# Patient Record
Sex: Male | Born: 1985 | Race: White | Hispanic: No | Marital: Single | Smoking: Never smoker
Health system: Southern US, Community
[De-identification: ages and names within clinical notes are randomized; demographics above are authoritative.]

## PROBLEM LIST (undated history)

## (undated) DIAGNOSIS — I10 Essential (primary) hypertension: Secondary | ICD-10-CM

## (undated) DIAGNOSIS — I4891 Unspecified atrial fibrillation: Secondary | ICD-10-CM

## (undated) DIAGNOSIS — R569 Unspecified convulsions: Secondary | ICD-10-CM

## (undated) HISTORY — PX: KIDNEY TRANSPLANT: SHX239

---
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
## 2004-07-17 ENCOUNTER — Ambulatory Visit: Payer: Self-pay | Admitting: Nephrology

## 2004-08-05 ENCOUNTER — Ambulatory Visit: Payer: Self-pay | Admitting: Nephrology

## 2004-08-12 ENCOUNTER — Ambulatory Visit: Payer: Self-pay | Admitting: Nephrology

## 2004-08-14 ENCOUNTER — Ambulatory Visit: Payer: Self-pay | Admitting: Nephrology

## 2004-08-16 ENCOUNTER — Ambulatory Visit: Payer: Self-pay | Admitting: Nephrology

## 2004-08-21 ENCOUNTER — Ambulatory Visit: Payer: Self-pay | Admitting: Nephrology

## 2004-08-28 ENCOUNTER — Ambulatory Visit: Payer: Self-pay | Admitting: Nephrology

## 2004-09-02 ENCOUNTER — Ambulatory Visit: Payer: Self-pay | Admitting: *Deleted

## 2004-09-09 ENCOUNTER — Ambulatory Visit: Payer: Self-pay | Admitting: *Deleted

## 2005-02-07 DIAGNOSIS — R109 Unspecified abdominal pain: Secondary | ICD-10-CM | POA: Diagnosis present

## 2005-11-05 ENCOUNTER — Other Ambulatory Visit: Payer: Self-pay

## 2005-11-05 ENCOUNTER — Emergency Department: Payer: Self-pay | Admitting: Emergency Medicine

## 2005-11-19 ENCOUNTER — Ambulatory Visit: Payer: Self-pay | Admitting: Nephrology

## 2006-01-12 ENCOUNTER — Ambulatory Visit: Payer: Self-pay | Admitting: Nephrology

## 2006-01-16 ENCOUNTER — Emergency Department: Payer: Self-pay | Admitting: Emergency Medicine

## 2006-01-19 ENCOUNTER — Ambulatory Visit: Payer: Self-pay | Admitting: Nephrology

## 2006-04-24 ENCOUNTER — Ambulatory Visit: Payer: Self-pay | Admitting: Nephrology

## 2007-01-21 ENCOUNTER — Emergency Department: Payer: Self-pay | Admitting: Emergency Medicine

## 2007-01-21 ENCOUNTER — Other Ambulatory Visit: Payer: Self-pay

## 2007-11-30 DIAGNOSIS — I4891 Unspecified atrial fibrillation: Secondary | ICD-10-CM | POA: Diagnosis present

## 2008-04-29 DIAGNOSIS — F32A Depression, unspecified: Secondary | ICD-10-CM | POA: Insufficient documentation

## 2008-04-29 DIAGNOSIS — F329 Major depressive disorder, single episode, unspecified: Secondary | ICD-10-CM | POA: Insufficient documentation

## 2008-11-07 ENCOUNTER — Emergency Department: Payer: Self-pay | Admitting: Unknown Physician Specialty

## 2009-01-08 DIAGNOSIS — T8612 Kidney transplant failure: Secondary | ICD-10-CM | POA: Insufficient documentation

## 2009-01-12 DIAGNOSIS — N433 Hydrocele, unspecified: Secondary | ICD-10-CM | POA: Insufficient documentation

## 2009-06-14 DIAGNOSIS — B338 Other specified viral diseases: Secondary | ICD-10-CM | POA: Insufficient documentation

## 2011-05-11 DIAGNOSIS — G479 Sleep disorder, unspecified: Secondary | ICD-10-CM | POA: Insufficient documentation

## 2011-09-02 DIAGNOSIS — I10 Essential (primary) hypertension: Secondary | ICD-10-CM | POA: Insufficient documentation

## 2012-02-23 DIAGNOSIS — Z940 Kidney transplant status: Secondary | ICD-10-CM | POA: Insufficient documentation

## 2012-03-10 DIAGNOSIS — M792 Neuralgia and neuritis, unspecified: Secondary | ICD-10-CM | POA: Insufficient documentation

## 2012-03-10 DIAGNOSIS — G8929 Other chronic pain: Secondary | ICD-10-CM | POA: Insufficient documentation

## 2012-03-10 DIAGNOSIS — N19 Unspecified kidney failure: Secondary | ICD-10-CM | POA: Insufficient documentation

## 2012-05-11 DIAGNOSIS — Z48298 Encounter for aftercare following other organ transplant: Secondary | ICD-10-CM | POA: Insufficient documentation

## 2013-05-02 ENCOUNTER — Emergency Department: Payer: Self-pay | Admitting: Emergency Medicine

## 2013-05-31 DIAGNOSIS — D631 Anemia in chronic kidney disease: Secondary | ICD-10-CM | POA: Diagnosis present

## 2013-06-09 DIAGNOSIS — G40909 Epilepsy, unspecified, not intractable, without status epilepticus: Secondary | ICD-10-CM | POA: Insufficient documentation

## 2013-07-11 ENCOUNTER — Inpatient Hospital Stay: Payer: Self-pay | Admitting: Internal Medicine

## 2013-07-11 LAB — URINALYSIS, COMPLETE
Bilirubin,UR: NEGATIVE
Glucose,UR: NEGATIVE mg/dL (ref 0–75)
Ketone: NEGATIVE
Leukocyte Esterase: NEGATIVE
Nitrite: NEGATIVE
Ph: 6 (ref 4.5–8.0)
Protein: NEGATIVE
WBC UR: NONE SEEN /HPF (ref 0–5)

## 2013-07-11 LAB — DRUG SCREEN, URINE
Amphetamines, Ur Screen: NEGATIVE (ref ?–1000)
Cannabinoid 50 Ng, Ur ~~LOC~~: POSITIVE (ref ?–50)
MDMA (Ecstasy)Ur Screen: NEGATIVE (ref ?–500)
Methadone, Ur Screen: NEGATIVE (ref ?–300)
Opiate, Ur Screen: NEGATIVE (ref ?–300)
Tricyclic, Ur Screen: NEGATIVE (ref ?–1000)

## 2013-07-11 LAB — COMPREHENSIVE METABOLIC PANEL
Albumin: 4.7 g/dL (ref 3.4–5.0)
BUN: 39 mg/dL — ABNORMAL HIGH (ref 7–18)
Calcium, Total: 8.9 mg/dL (ref 8.5–10.1)
Co2: 14 mmol/L — ABNORMAL LOW (ref 21–32)
EGFR (African American): 41 — ABNORMAL LOW
EGFR (Non-African Amer.): 35 — ABNORMAL LOW
Glucose: 106 mg/dL — ABNORMAL HIGH (ref 65–99)
Osmolality: 261 (ref 275–301)
Potassium: 3.4 mmol/L — ABNORMAL LOW (ref 3.5–5.1)
SGOT(AST): 45 U/L — ABNORMAL HIGH (ref 15–37)
Sodium: 125 mmol/L — ABNORMAL LOW (ref 136–145)

## 2013-07-11 LAB — CBC
HGB: 12 g/dL — ABNORMAL LOW (ref 13.0–18.0)
MCH: 31.1 pg (ref 26.0–34.0)
MCV: 88 fL (ref 80–100)
Platelet: 248 10*3/uL (ref 150–440)
RBC: 3.85 10*6/uL — ABNORMAL LOW (ref 4.40–5.90)
RDW: 13.8 % (ref 11.5–14.5)
WBC: 9.1 10*3/uL (ref 3.8–10.6)

## 2013-07-11 LAB — PROTIME-INR: Prothrombin Time: 12.9 secs (ref 11.5–14.7)

## 2013-07-11 LAB — ETHANOL: Ethanol: <3 mg/dL

## 2013-07-11 LAB — OSMOLALITY, URINE: Osmolality: 174 mOsm/kg

## 2013-07-12 LAB — CBC WITH DIFFERENTIAL/PLATELET
Eosinophil #: 0.1 10*3/uL (ref 0.0–0.7)
HCT: 32 % — ABNORMAL LOW (ref 40.0–52.0)
HGB: 11.3 g/dL — ABNORMAL LOW (ref 13.0–18.0)
Lymphocyte %: 15.9 %
MCH: 31.1 pg (ref 26.0–34.0)
MCV: 88 fL (ref 80–100)
Monocyte %: 11.4 %
Neutrophil #: 6 10*3/uL (ref 1.4–6.5)
Neutrophil %: 70.3 %
Platelet: 223 10*3/uL (ref 150–440)
RBC: 3.63 10*6/uL — ABNORMAL LOW (ref 4.40–5.90)
RDW: 13.8 % (ref 11.5–14.5)

## 2013-07-12 LAB — BASIC METABOLIC PANEL
Calcium, Total: 8.7 mg/dL (ref 8.5–10.1)
Chloride: 113 mmol/L — ABNORMAL HIGH (ref 98–107)
Co2: 17 mmol/L — ABNORMAL LOW (ref 21–32)
Creatinine: 1.95 mg/dL — ABNORMAL HIGH (ref 0.60–1.30)
EGFR (African American): 52 — ABNORMAL LOW
Glucose: 76 mg/dL (ref 65–99)
Potassium: 4 mmol/L (ref 3.5–5.1)

## 2013-07-12 LAB — CK
CK, Total: 1853 U/L — ABNORMAL HIGH (ref 35–232)
CK, Total: 2138 U/L — ABNORMAL HIGH (ref 35–232)

## 2013-07-13 LAB — BASIC METABOLIC PANEL
Anion Gap: 14 (ref 7–16)
BUN: 25 mg/dL — ABNORMAL HIGH (ref 7–18)
Calcium, Total: 9.2 mg/dL (ref 8.5–10.1)
Co2: 14 mmol/L — ABNORMAL LOW (ref 21–32)
EGFR (African American): 50 — ABNORMAL LOW
EGFR (Non-African Amer.): 43 — ABNORMAL LOW
Osmolality: 281 (ref 275–301)

## 2013-07-13 LAB — CK: CK, Total: 1513 U/L — ABNORMAL HIGH (ref 35–232)

## 2013-07-15 DIAGNOSIS — M79602 Pain in left arm: Secondary | ICD-10-CM | POA: Insufficient documentation

## 2013-07-16 LAB — CULTURE, BLOOD (SINGLE)

## 2013-10-04 ENCOUNTER — Emergency Department: Payer: Self-pay | Admitting: Emergency Medicine

## 2013-10-04 LAB — COMPREHENSIVE METABOLIC PANEL
ALK PHOS: 95 U/L
Albumin: 4.4 g/dL (ref 3.4–5.0)
Anion Gap: 9 (ref 7–16)
BUN: 34 mg/dL — ABNORMAL HIGH (ref 7–18)
Bilirubin,Total: 0.6 mg/dL (ref 0.2–1.0)
Calcium, Total: 8.5 mg/dL (ref 8.5–10.1)
Chloride: 99 mmol/L (ref 98–107)
Co2: 21 mmol/L (ref 21–32)
Creatinine: 1.88 mg/dL — ABNORMAL HIGH (ref 0.60–1.30)
EGFR (African American): 55 — ABNORMAL LOW
GFR CALC NON AF AMER: 47 — AB
Glucose: 81 mg/dL (ref 65–99)
Osmolality: 266 (ref 275–301)
POTASSIUM: 4.6 mmol/L (ref 3.5–5.1)
SGOT(AST): 32 U/L (ref 15–37)
SGPT (ALT): 28 U/L (ref 12–78)
Sodium: 129 mmol/L — ABNORMAL LOW (ref 136–145)
Total Protein: 7.8 g/dL (ref 6.4–8.2)

## 2013-10-04 LAB — CBC WITH DIFFERENTIAL/PLATELET
BASOS PCT: 1.1 %
Basophil #: 0.1 10*3/uL (ref 0.0–0.1)
EOS PCT: 2.6 %
Eosinophil #: 0.2 10*3/uL (ref 0.0–0.7)
HCT: 33.1 % — ABNORMAL LOW (ref 40.0–52.0)
HGB: 11.3 g/dL — ABNORMAL LOW (ref 13.0–18.0)
LYMPHS PCT: 12.3 %
Lymphocyte #: 1.1 10*3/uL (ref 1.0–3.6)
MCH: 30.1 pg (ref 26.0–34.0)
MCHC: 34.1 g/dL (ref 32.0–36.0)
MCV: 88 fL (ref 80–100)
Monocyte #: 0.7 x10 3/mm (ref 0.2–1.0)
Monocyte %: 7.7 %
Neutrophil #: 6.7 10*3/uL — ABNORMAL HIGH (ref 1.4–6.5)
Neutrophil %: 76.3 %
Platelet: 269 10*3/uL (ref 150–440)
RBC: 3.75 10*6/uL — ABNORMAL LOW (ref 4.40–5.90)
RDW: 12.7 % (ref 11.5–14.5)
WBC: 8.8 10*3/uL (ref 3.8–10.6)

## 2013-10-04 LAB — URINALYSIS, COMPLETE
BILIRUBIN, UR: NEGATIVE
BLOOD: NEGATIVE
Bacteria: NONE SEEN
Glucose,UR: NEGATIVE mg/dL (ref 0–75)
Ketone: NEGATIVE
LEUKOCYTE ESTERASE: NEGATIVE
NITRITE: NEGATIVE
PROTEIN: NEGATIVE
Ph: 6 (ref 4.5–8.0)
SPECIFIC GRAVITY: 1.003 (ref 1.003–1.030)
Squamous Epithelial: NONE SEEN
WBC UR: <1 /HPF (ref 0–5)

## 2013-10-04 LAB — LIPASE, BLOOD: Lipase: 430 U/L — ABNORMAL HIGH (ref 73–393)

## 2013-10-12 LAB — CBC WITH DIFFERENTIAL/PLATELET
BASOS PCT: 0.9 %
Basophil #: 0.1 10*3/uL (ref 0.0–0.1)
EOS PCT: 0.8 %
Eosinophil #: 0.1 10*3/uL (ref 0.0–0.7)
HCT: 33.1 % — AB (ref 40.0–52.0)
HGB: 11.7 g/dL — AB (ref 13.0–18.0)
Lymphocyte #: 1.1 10*3/uL (ref 1.0–3.6)
Lymphocyte %: 10.6 %
MCH: 30.8 pg (ref 26.0–34.0)
MCHC: 35.2 g/dL (ref 32.0–36.0)
MCV: 88 fL (ref 80–100)
MONO ABS: 0.5 x10 3/mm (ref 0.2–1.0)
Monocyte %: 5.1 %
NEUTROS ABS: 8.4 10*3/uL — AB (ref 1.4–6.5)
NEUTROS PCT: 82.6 %
Platelet: 255 10*3/uL (ref 150–440)
RBC: 3.79 10*6/uL — AB (ref 4.40–5.90)
RDW: 12.6 % (ref 11.5–14.5)
WBC: 10.1 10*3/uL (ref 3.8–10.6)

## 2013-10-12 LAB — URINALYSIS, COMPLETE
BACTERIA: NONE SEEN
BLOOD: NEGATIVE
Bilirubin,UR: NEGATIVE
GLUCOSE, UR: NEGATIVE mg/dL (ref 0–75)
Ketone: NEGATIVE
Leukocyte Esterase: NEGATIVE
Nitrite: NEGATIVE
Ph: 5 (ref 4.5–8.0)
Protein: NEGATIVE
Specific Gravity: 1.006 (ref 1.003–1.030)
Squamous Epithelial: NONE SEEN
WBC UR: NONE SEEN /HPF (ref 0–5)

## 2013-10-12 LAB — COMPREHENSIVE METABOLIC PANEL
ALBUMIN: 3.9 g/dL (ref 3.4–5.0)
Alkaline Phosphatase: 84 U/L
Anion Gap: 8 (ref 7–16)
BUN: 31 mg/dL — AB (ref 7–18)
Bilirubin,Total: 0.5 mg/dL (ref 0.2–1.0)
CALCIUM: 8 mg/dL — AB (ref 8.5–10.1)
CHLORIDE: 98 mmol/L (ref 98–107)
Co2: 19 mmol/L — ABNORMAL LOW (ref 21–32)
Creatinine: 1.87 mg/dL — ABNORMAL HIGH (ref 0.60–1.30)
GFR CALC AF AMER: 57 — AB
GFR CALC AF AMER: 55 — AB
GFR CALC NON AF AMER: 49 — AB
GFR CALC NON AF AMER: 48 — AB
Glucose: 92 mg/dL (ref 65–99)
Osmolality: 258 (ref 275–301)
Potassium: 4.3 mmol/L (ref 3.5–5.1)
SGOT(AST): 35 U/L (ref 15–37)
SGPT (ALT): 28 U/L (ref 12–78)
SODIUM: 125 mmol/L — AB (ref 136–145)
Total Protein: 7 g/dL (ref 6.4–8.2)

## 2013-10-12 LAB — LIPASE, BLOOD: Lipase: 527 U/L — ABNORMAL HIGH (ref 73–393)

## 2013-10-13 LAB — LIPASE, BLOOD: LIPASE: 1218 U/L — AB (ref 73–393)

## 2013-10-13 LAB — BASIC METABOLIC PANEL
Anion Gap: 5 — ABNORMAL LOW (ref 7–16)
BUN: 24 mg/dL — ABNORMAL HIGH (ref 7–18)
CALCIUM: 8.8 mg/dL (ref 8.5–10.1)
CHLORIDE: 109 mmol/L — AB (ref 98–107)
Co2: 22 mmol/L (ref 21–32)
Creatinine: 1.9 mg/dL — ABNORMAL HIGH (ref 0.60–1.30)
EGFR (African American): 54 — ABNORMAL LOW
EGFR (Non-African Amer.): 47 — ABNORMAL LOW
Glucose: 82 mg/dL (ref 65–99)
Osmolality: 275 (ref 275–301)
POTASSIUM: 4.4 mmol/L (ref 3.5–5.1)
SODIUM: 136 mmol/L (ref 136–145)

## 2013-10-14 ENCOUNTER — Inpatient Hospital Stay: Payer: Self-pay | Admitting: Student

## 2013-10-14 LAB — BASIC METABOLIC PANEL
ANION GAP: 9 (ref 7–16)
BUN: 22 mg/dL — ABNORMAL HIGH (ref 7–18)
CHLORIDE: 108 mmol/L — AB (ref 98–107)
CREATININE: 1.9 mg/dL — AB (ref 0.60–1.30)
Calcium, Total: 8.9 mg/dL (ref 8.5–10.1)
Co2: 19 mmol/L — ABNORMAL LOW (ref 21–32)
EGFR (Non-African Amer.): 47 — ABNORMAL LOW
GFR CALC AF AMER: 54 — AB
Glucose: 84 mg/dL (ref 65–99)
Osmolality: 274 (ref 275–301)
Potassium: 4.4 mmol/L (ref 3.5–5.1)
Sodium: 136 mmol/L (ref 136–145)

## 2013-10-14 LAB — LIPASE, BLOOD: LIPASE: 258 U/L (ref 73–393)

## 2013-12-04 DIAGNOSIS — R197 Diarrhea, unspecified: Secondary | ICD-10-CM | POA: Insufficient documentation

## 2014-02-06 ENCOUNTER — Emergency Department: Payer: Self-pay | Admitting: Emergency Medicine

## 2014-02-06 LAB — CBC WITH DIFFERENTIAL/PLATELET
BASOS PCT: 0.8 %
Basophil #: 0.1 10*3/uL (ref 0.0–0.1)
EOS PCT: 0.9 %
Eosinophil #: 0.1 10*3/uL (ref 0.0–0.7)
HCT: 37.3 % — ABNORMAL LOW (ref 40.0–52.0)
HGB: 12.6 g/dL — AB (ref 13.0–18.0)
Lymphocyte #: 1.7 10*3/uL (ref 1.0–3.6)
Lymphocyte %: 16.3 %
MCH: 30.5 pg (ref 26.0–34.0)
MCHC: 33.9 g/dL (ref 32.0–36.0)
MCV: 90 fL (ref 80–100)
Monocyte #: 0.9 x10 3/mm (ref 0.2–1.0)
Monocyte %: 8.8 %
Neutrophil #: 7.5 10*3/uL — ABNORMAL HIGH (ref 1.4–6.5)
Neutrophil %: 73.2 %
Platelet: 252 10*3/uL (ref 150–440)
RBC: 4.14 10*6/uL — ABNORMAL LOW (ref 4.40–5.90)
RDW: 12.8 % (ref 11.5–14.5)
WBC: 10.3 10*3/uL (ref 3.8–10.6)

## 2014-02-06 LAB — COMPREHENSIVE METABOLIC PANEL
ALK PHOS: 68 U/L
ANION GAP: 7 (ref 7–16)
Albumin: 4.7 g/dL (ref 3.4–5.0)
BUN: 26 mg/dL — AB (ref 7–18)
Bilirubin,Total: 0.7 mg/dL (ref 0.2–1.0)
CALCIUM: 9.3 mg/dL (ref 8.5–10.1)
Chloride: 95 mmol/L — ABNORMAL LOW (ref 98–107)
Co2: 21 mmol/L (ref 21–32)
Creatinine: 2.03 mg/dL — ABNORMAL HIGH (ref 0.60–1.30)
GFR CALC AF AMER: 50 — AB
GFR CALC NON AF AMER: 43 — AB
GLUCOSE: 97 mg/dL (ref 65–99)
Osmolality: 252 (ref 275–301)
POTASSIUM: 4.8 mmol/L (ref 3.5–5.1)
SGOT(AST): 40 U/L — ABNORMAL HIGH (ref 15–37)
SGPT (ALT): 28 U/L (ref 12–78)
Sodium: 123 mmol/L — ABNORMAL LOW (ref 136–145)
TOTAL PROTEIN: 7.9 g/dL (ref 6.4–8.2)

## 2014-02-06 LAB — URINALYSIS, COMPLETE
Bacteria: NONE SEEN
Bilirubin,UR: NEGATIVE
Blood: NEGATIVE
GLUCOSE, UR: NEGATIVE mg/dL (ref 0–75)
KETONE: NEGATIVE
Leukocyte Esterase: NEGATIVE
Nitrite: NEGATIVE
Ph: 6 (ref 4.5–8.0)
Protein: NEGATIVE
RBC, UR: NONE SEEN /HPF (ref 0–5)
SQUAMOUS EPITHELIAL: NONE SEEN
Specific Gravity: 1.005 (ref 1.003–1.030)
WBC UR: NONE SEEN /HPF (ref 0–5)

## 2014-02-06 LAB — PRO B NATRIURETIC PEPTIDE: B-Type Natriuretic Peptide: 1175 pg/mL — ABNORMAL HIGH (ref 0–125)

## 2014-02-08 DIAGNOSIS — N5082 Scrotal pain: Secondary | ICD-10-CM | POA: Insufficient documentation

## 2014-02-13 ENCOUNTER — Emergency Department: Payer: Self-pay | Admitting: Emergency Medicine

## 2014-02-28 DIAGNOSIS — R112 Nausea with vomiting, unspecified: Secondary | ICD-10-CM | POA: Insufficient documentation

## 2014-04-23 DIAGNOSIS — R413 Other amnesia: Secondary | ICD-10-CM | POA: Insufficient documentation

## 2014-05-12 ENCOUNTER — Emergency Department: Payer: Self-pay | Admitting: Emergency Medicine

## 2014-05-12 LAB — COMPREHENSIVE METABOLIC PANEL
ALBUMIN: 4.4 g/dL (ref 3.4–5.0)
Alkaline Phosphatase: 59 U/L
Anion Gap: 10 (ref 7–16)
BUN: 29 mg/dL — ABNORMAL HIGH (ref 7–18)
Bilirubin,Total: 0.8 mg/dL (ref 0.2–1.0)
CHLORIDE: 89 mmol/L — AB (ref 98–107)
CO2: 23 mmol/L (ref 21–32)
Calcium, Total: 9.2 mg/dL (ref 8.5–10.1)
Creatinine: 1.87 mg/dL — ABNORMAL HIGH (ref 0.60–1.30)
EGFR (African American): 55 — ABNORMAL LOW
GFR CALC NON AF AMER: 47 — AB
GLUCOSE: 101 mg/dL — AB (ref 65–99)
OSMOLALITY: 252 (ref 275–301)
POTASSIUM: 3.7 mmol/L (ref 3.5–5.1)
SGOT(AST): 44 U/L — ABNORMAL HIGH (ref 15–37)
SGPT (ALT): 26 U/L
SODIUM: 122 mmol/L — AB (ref 136–145)
Total Protein: 7.8 g/dL (ref 6.4–8.2)

## 2014-05-12 LAB — CBC WITH DIFFERENTIAL/PLATELET
BASOS ABS: 0.1 10*3/uL (ref 0.0–0.1)
BASOS PCT: 1.3 %
EOS ABS: 0.3 10*3/uL (ref 0.0–0.7)
Eosinophil %: 2.6 %
HCT: 35.8 % — ABNORMAL LOW (ref 40.0–52.0)
HGB: 12.3 g/dL — AB (ref 13.0–18.0)
LYMPHS ABS: 3.4 10*3/uL (ref 1.0–3.6)
LYMPHS PCT: 34.6 %
MCH: 31 pg (ref 26.0–34.0)
MCHC: 34.5 g/dL (ref 32.0–36.0)
MCV: 90 fL (ref 80–100)
Monocyte #: 1.4 x10 3/mm — ABNORMAL HIGH (ref 0.2–1.0)
Monocyte %: 13.8 %
Neutrophil #: 4.7 10*3/uL (ref 1.4–6.5)
Neutrophil %: 47.7 %
Platelet: 303 10*3/uL (ref 150–440)
RBC: 3.98 10*6/uL — AB (ref 4.40–5.90)
RDW: 12.7 % (ref 11.5–14.5)
WBC: 10 10*3/uL (ref 3.8–10.6)

## 2014-05-12 LAB — LIPASE, BLOOD: LIPASE: 367 U/L (ref 73–393)

## 2014-05-22 DIAGNOSIS — G8929 Other chronic pain: Secondary | ICD-10-CM | POA: Insufficient documentation

## 2014-05-22 DIAGNOSIS — R109 Unspecified abdominal pain: Secondary | ICD-10-CM | POA: Insufficient documentation

## 2014-05-25 ENCOUNTER — Emergency Department: Payer: Self-pay | Admitting: Emergency Medicine

## 2014-05-25 LAB — URINALYSIS, COMPLETE
BACTERIA: NONE SEEN
Bilirubin,UR: NEGATIVE
Blood: NEGATIVE
GLUCOSE, UR: NEGATIVE mg/dL (ref 0–75)
Ketone: NEGATIVE
Leukocyte Esterase: NEGATIVE
Nitrite: NEGATIVE
PROTEIN: NEGATIVE
Ph: 6 (ref 4.5–8.0)
RBC,UR: <1 /HPF (ref 0–5)
SPECIFIC GRAVITY: 1.005 (ref 1.003–1.030)
SQUAMOUS EPITHELIAL: NONE SEEN
WBC UR: NONE SEEN /HPF (ref 0–5)

## 2014-05-25 LAB — COMPREHENSIVE METABOLIC PANEL
Albumin: 4.3 g/dL (ref 3.4–5.0)
Alkaline Phosphatase: 61 U/L
Anion Gap: 10 (ref 7–16)
BUN: 24 mg/dL — AB (ref 7–18)
Bilirubin,Total: 0.8 mg/dL (ref 0.2–1.0)
CALCIUM: 9.2 mg/dL (ref 8.5–10.1)
CHLORIDE: 94 mmol/L — AB (ref 98–107)
CREATININE: 2.01 mg/dL — AB (ref 0.60–1.30)
Co2: 20 mmol/L — ABNORMAL LOW (ref 21–32)
EGFR (African American): 50 — ABNORMAL LOW
EGFR (Non-African Amer.): 43 — ABNORMAL LOW
Glucose: 86 mg/dL (ref 65–99)
OSMOLALITY: 253 (ref 275–301)
POTASSIUM: 5.3 mmol/L — AB (ref 3.5–5.1)
SGOT(AST): 51 U/L — ABNORMAL HIGH (ref 15–37)
SGPT (ALT): 26 U/L
SODIUM: 124 mmol/L — AB (ref 136–145)
Total Protein: 7.5 g/dL (ref 6.4–8.2)

## 2014-05-25 LAB — CBC WITH DIFFERENTIAL/PLATELET
BASOS PCT: 0.7 %
Basophil #: 0.1 10*3/uL (ref 0.0–0.1)
EOS ABS: 0.1 10*3/uL (ref 0.0–0.7)
EOS PCT: 1 %
HCT: 34.6 % — AB (ref 40.0–52.0)
HGB: 11.7 g/dL — AB (ref 13.0–18.0)
Lymphocyte #: 1.2 10*3/uL (ref 1.0–3.6)
Lymphocyte %: 10.4 %
MCH: 30.4 pg (ref 26.0–34.0)
MCHC: 33.8 g/dL (ref 32.0–36.0)
MCV: 90 fL (ref 80–100)
MONOS PCT: 6.3 %
Monocyte #: 0.7 x10 3/mm (ref 0.2–1.0)
Neutrophil #: 9.4 10*3/uL — ABNORMAL HIGH (ref 1.4–6.5)
Neutrophil %: 81.6 %
PLATELETS: 282 10*3/uL (ref 150–440)
RBC: 3.84 10*6/uL — ABNORMAL LOW (ref 4.40–5.90)
RDW: 12.6 % (ref 11.5–14.5)
WBC: 11.6 10*3/uL — AB (ref 3.8–10.6)

## 2014-05-25 LAB — LIPASE, BLOOD: Lipase: 412 U/L — ABNORMAL HIGH (ref 73–393)

## 2014-07-19 DIAGNOSIS — N529 Male erectile dysfunction, unspecified: Secondary | ICD-10-CM | POA: Insufficient documentation

## 2014-07-19 DIAGNOSIS — K859 Acute pancreatitis without necrosis or infection, unspecified: Secondary | ICD-10-CM | POA: Insufficient documentation

## 2014-07-19 DIAGNOSIS — E213 Hyperparathyroidism, unspecified: Secondary | ICD-10-CM | POA: Insufficient documentation

## 2014-07-19 DIAGNOSIS — Z87438 Personal history of other diseases of male genital organs: Secondary | ICD-10-CM | POA: Insufficient documentation

## 2014-07-19 DIAGNOSIS — IMO0002 Reserved for concepts with insufficient information to code with codable children: Secondary | ICD-10-CM | POA: Insufficient documentation

## 2014-07-19 DIAGNOSIS — K529 Noninfective gastroenteritis and colitis, unspecified: Secondary | ICD-10-CM | POA: Insufficient documentation

## 2014-07-19 DIAGNOSIS — E559 Vitamin D deficiency, unspecified: Secondary | ICD-10-CM | POA: Insufficient documentation

## 2014-07-19 DIAGNOSIS — M8580 Other specified disorders of bone density and structure, unspecified site: Secondary | ICD-10-CM | POA: Insufficient documentation

## 2014-07-19 DIAGNOSIS — M859 Disorder of bone density and structure, unspecified: Secondary | ICD-10-CM | POA: Insufficient documentation

## 2014-09-03 DIAGNOSIS — R1013 Epigastric pain: Secondary | ICD-10-CM | POA: Insufficient documentation

## 2015-01-04 NOTE — Discharge Summary (Signed)
PATIENT NAME:  John Cline, John Cline MR#:  [id number] DATE OF BIRTH:  05-08-1986  DATE OF ADMISSION:  07/11/2013 DATE OF DISCHARGE:  07/13/2013  DISCHARGE DIAGNOSES: 1.  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  2.  History of cerebrovascular accident.  3.  Chronic kidney disease, stage III, stable.  4.  Rhabdomyolysis.  5.  History of renal transplant.  6.  Hyponatremia, resolved.   IMAGING STUDIES DONE: Include:  1.  CT scan of head without contrast was normal.  2.  CT of cervical spine showed minimal anterolisthesis of C3, C4. No acute abnormalities, dislocation or fracture.  3.  MRI of the brain without contrast showed small area of prior stroke in the left parietal parafalcine area.   ADMITTING HISTORY AND PHYSICAL: Please see detailed H and P dictated by Dr. Posey Pronto. In brief, a 29 year old Caucasian male patient with prior history of stroke, renal transplant, presented to the hospital with 3 episodes of seizures. The patient was on Vimpat and Keppra, was loaded with Dilantin in the ER, but later started on IV Keppra, admitted to the hospitalist service.   HOSPITAL COURSE: 1.  Seizures. The patient had an EEG done which showed left frontal epileptiform waves after which neurology was consulted. Dr. Tamala Julian with neurology saw the patient and with concern for PRES ordered a MRI. The patient's Vimpat has been doubled in dose along with continuing the Keppra. The patient's MRI has returned showing old stroke, no acute findings. I have discussed this with Dr. Manuella Ghazi of neurology who suggested the patient will need outpatient follow-up with his neurologist and continuing his increased dose of Vimpat and Keppra.  2.  Hyponatremia. This is secondary to dehydration and decreased solutes from decreased eating. Resolved with IV fluids.  3.  Rhabdomyolysis secondary to seizures. Initially levels increased from 1800 to 2500 but they are down to 1600 and the patient is being  discharged home in fair condition.   Prior to discharge, the patient's neurological examination shows motor strength 5/5, alert and oriented x 3. Mood and affect appropriate.   DISCHARGE MEDICATIONS: Include:  1.  Vimpat 200 mg oral b.i.d.  2.  Amlodipine 5 mg 2 times a day. 3.  Cyclosporin 100 mg oral every 12 hours and 25 mg every 12 hours.  4.  Prednisone 5 mg oral once a day.  5.  Gabapentin 100 mg b.i.d.  6.  Keppra 1000 mg b.i.d.  7.  Metoprolol 75 mg oral 2 times a day.  8.  Sodium bicarbonate 650 mg oral once a day.   DISCHARGE INSTRUCTIONS: Low-sodium diet. Activity as tolerated. Follow up with nephrology and neurology at [facility] in 1 to 2 weeks.  TOTAL TIME SPENT: On day of discharge in discharge activity was 40 minutes. ____________________________ John Alf Stevan Eberwein, MD srs:sb D: 07/13/2013 13:50:50 ET T: 07/13/2013 14:34:11 ET JOB#: [id number]  cc: Alveta Heimlich R. Auna Mikkelsen, MD, <Dictator> [facility] MD ELECTRONICALLY SIGNED 07/14/2013 13:01

## 2015-01-04 NOTE — Consult Note (Signed)
Consulting Physician: Dr Arvella Merles for Consult: Renal txp, elevated CK levels, low serum sodium Mr Muraca is a 29 year old man s/o LRT 01/08/09. He follows with Dr Toy Cookey at [facility]. He was last seen on 06/04/13. Baseline Cr 2-2.5. He is on CSA 125 mg bid with a goal CSA trough of 75-125. He had labs on 10/21 and his Cr was 1.85 and CSA level of 75.  is admitted to [facility] apparently after having a seziure. His serum sodium was noted to be low. The patient admits that he has been struggling with edema and poor oral intake (poor solute intake) but trying to drink fluids. His Sodium on 10/21 was 133. He believes that he furst developed a headache and that led to his decreased oral intake. He denies any F/C/N/V/D.  had a CT Scan no contrast and no acute abnl. He did hit his head yesterday. He currently feels well but does have a mild left temporal headache. Neurology has been consulted. He is currently getting 1/2 NS. He is eating well. Of note, UTOX + for benzos and cannaboids. He admits to being compliant with his seizure medications and has not had issues with seizures "in years" MPGNBil native nephrectomyh/o HD and PDLRT x 2 (2000) (2010)htnh/o seziure d/ohernia repairdialysis access surgeries morphineCSA 125 mg bid, pred 5 mg qd, metoprolol 75 mg bid, norvasc 5 mg bid, sensipar 30 mg qd, keppra 1gm bid, lacosamide 100 mg bid, gapapentin 100/200 qam/qpm, amitrip 25 mg qpm reviewed  98.7  72 20  122/76 98 RAappears well, walking with IV poleclear bilRRRno tenderness over allograft in LLQno edemaalert, answering questions  29 year old man with LRT, baseline Cr 2-2.5 (most recent 1.9). LRT- Renal function stable. Continue outpt regimen of CSA 125 mg bid and prednisone 5 mg qd. Hyponatremia- resolved. Likely precipitated seizure. ?low solute intake? Getting 1/2 NS  HTN- pt also takes metoprolol as outpt. Currently on hold. Would restart in leiu of norvasc to avoid rebound hypertension or chest pain Mild rhabdo- CK  level midly elevated. Would change IVF to NS at 75 cc/hr and continue for another hour. He appears to be voiding.CK level and BMP in morning. am available by page at [phone] for any questions. I will not see the patient on 10/30. Please page with any questions. He can follow-up with Dr Toy Cookey, his txp nephrologist, as an outpt.  Electronic Signatures: Trinda Pascal (MD)  (Signed on 29-Oct-14 15:02)  Authored  Last Updated: 29-Oct-14 15:02 by Trinda Pascal (MD)

## 2015-01-04 NOTE — Consult Note (Signed)
Referring Physician:  Alric Seton   Primary Care Physician:  Alric Seton : [facility], [address] [place], [place] [address], [place] [phone]  Reason for Consult: Admit Date: 11-Jul-2013  Chief Complaint: seizures  Reason for Consult: seizure   History of Present Illness: History of Present Illness:   29 yo RHD M presents to [facility] secondary to seizure.  Pt has had seizures for the past 15 years.  He just had Vimpat added over the past 2 years.  He reports compliance with medications.  Last large seizure that he notes was in Mar 2013.  However, his mom is at bedside and notes that he has been having different episodes where he stares off and does not answer.  Pt does not remember these episodes but mom states he is confused after these.  No incontinence or tongue biting.  ROS:  General denies complaints   HEENT no complaints   Lungs no complaints   Cardiac no complaints   GI no complaints   GU no complaints   Musculoskeletal no complaints   Extremities no complaints   Skin no complaints   Neuro headache  seizure   Endocrine no complaints   Psych no complaints   Past Medical/Surgical Hx:  pancreatitis:   Seizures:   Nephrotic Syndrome:   Peritoneal Dialysis:   HTN:   Kidney Failure:   orignal and transplant kidneys removed at this time:   kidney transplant:   Past Medical/ Surgical Hx:  Past Medical History as above   Past Surgical History as above   Home Medications: Medication Instructions Last Modified Date/Time  Vimpat 100 mg oral tablet 2 tab(s) orally 2 times a day 29-Oct-14 12:03  amLODIPine 5 mg oral tablet 1 tab(s) orally 2 times a day 28-Oct-14 21:14  cycloSPORINE modified 100 mg oral capsule 1 cap(s) orally every 12 hours 28-Oct-14 21:14  cycloSPORINE modified 25 mg oral capsule 1 cap(s) orally every 12 hours 28-Oct-14 21:14  predniSONE 5 mg oral tablet 1 tab(s) orally once a day 28-Oct-14 21:14   gabapentin 100 mg oral capsule 1 cap(s) orally once a day 28-Oct-14 21:14  gabapentin 100 mg oral capsule 1 cap(s) orally once a day (at bedtime) 28-Oct-14 21:14  Keppra 1000 mg oral tablet 1 tab(s) orally 2 times a day 28-Oct-14 21:14  metoprolol 75 milligram(s) orally 2 times a day 28-Oct-14 21:14   Allergies:  No Known Allergies:   Allergies:  Allergies NKDA   Social/Family History: Employment Status: disabled  Lives With: parents  Living Arrangements: house  Social History: 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, no illicits, denies tob  Family History: no seizures   Vital Signs: **Vital Signs.:   29-Oct-14 08:00  Vital Signs Type Q 4hr  Temperature Temperature (F) 98.7  Celsius 37  Temperature Source oral  Pulse Pulse 72  Respirations Respirations 20  Systolic BP Systolic BP 144  Diastolic BP (mmHg) Diastolic BP (mmHg) 76  Mean BP 91  Pulse Ox % Pulse Ox % 98  Pulse Ox Activity Level  At rest  Oxygen Delivery Room Air/ 21 %   Physical Exam: General: thin, appears older than stated age, anxious  HEENT: normocephalic, sclera nonicteric, oropharynx clear  Neck: supple, no JVD, no bruits  Chest: CTA B, no wheezing, good movement  Cardiac: RRR, no murmurs, no edema, 2+ pulses  Extremities: no C/C/E, FROM   Neurologic Exam: Mental Status: alert and oriented x 3, normal speech and language,  follows complex commands  Cranial Nerves: PERRLA, EOMI, nl VF, face symmetric, tongue midline, shoulder shrug equal  Motor Exam: 5/5 B normal tone, high frequency tremor B UE  Deep Tendon Reflexes: 2+/4 B, plantars downgoing B, no Hoffman  Sensory Exam: pinprick, temperature, and vibration intact B  Coordination: FTN and HTS WNL, nl RAM   Lab Results: Thyroid:  29-Oct-14 05:03   Thyroid Stimulating Hormone 2.35 (0.45-4.50 (International Unit)  ----------------------- Pregnant patients have  different reference  ranges for TSH:  - - - - - - - - - -  Pregnant, first  trimetser:  0.36 - 2.50 uIU/mL)  Hepatic:  28-Oct-14 16:32   Bilirubin, Total 0.8  Alkaline Phosphatase 85  SGPT (ALT) 34  SGOT (AST)  45  Total Protein, Serum 7.7  Albumin, Serum 4.7  Routine Micro:  28-Oct-14 19:33   Micro Text Report BLOOD CULTURE   COMMENT                   NO GROWTH IN 8-12 HOURS   ANTIBIOTIC                       Culture Comment NO GROWTH IN 8-12 HOURS  Result(s) reported on 12 Jul 2013 at 03:00AM.  Routine Chem:  28-Oct-14 16:32   Ethanol, S. < 3  Ethanol % (comp) < 0.003 (Result(s) reported on 11 Jul 2013 at 07:22PM.)  Osmolality, Blood 276 (Result(s) reported on 11 Jul 2013 at 07:42PM.)  29-Oct-14 05:03   Glucose, Serum 76  BUN  32  Creatinine (comp)  1.95  Sodium, Serum 139  Potassium, Serum 4.0  Chloride, Serum  113  CO2, Serum  17  Calcium (Total), Serum 8.7  Anion Gap 9  Osmolality (calc) 283  eGFR (African American)  52  eGFR (Non-African American)  45 (eGFR values <52m/min/1.73 m2 may be an indication of chronic kidney disease (CKD). Calculated eGFR is useful in patients with stable renal function. The eGFR calculation will not be reliable in acutely ill patients when serum creatinine is changing rapidly. It is not useful in  patients on dialysis. The eGFR calculation may not be applicable to patients at the low and high extremes of body sizes, pregnant women, and vegetarians.)  Misc Urine Chem:  28-Oct-14 19:00   Osmolality, Urine Random 174 (50-1400 300-900 mOsm/kg   with avg Fluid Intake > 850 mOsm/kg  with Fluid Restriction)  Urine Drugs:  [id number]-57126:[id number]  Tricyclic Antidepressant, Ur Qual (comp) NEGATIVE (Result(s) reported on 11 Jul 2013 at 07:58PM.)  Amphetamines, Urine Qual. NEGATIVE  MDMA, Urine Qual. NEGATIVE  Cocaine Metabolite, Urine Qual. NEGATIVE  Opiate, Urine qual NEGATIVE  Phencyclidine, Urine Qual. NEGATIVE  Cannabinoid, Urine Qual. POSITIVE  Barbiturates, Urine Qual. NEGATIVE  Benzodiazepine, Urine Qual.  POSITIVE (----------------- The URINE DRUG SCREEN provides only a preliminary, unconfirmed analytical test result and should not be used for non-medical  purposes.  Clinical consideration and professional judgment should be  applied to any positive drug screen result due to possible interfering substances.  A more specific alternate chemical method must be used in order to obtain a confirmed analytical result.  Gas chromatography/mass spectrometry (GC/MS) is the preferred confirmatory method.)  Methadone, Urine Qual. NEGATIVE  Cardiac:  28-Oct-14 16:32   Troponin I < 0.02 (0.00-0.05 0.05 ng/mL or less: NEGATIVE  Repeat testing in 3-6 hrs  if clinically indicated. >0.05 ng/mL: POTENTIAL  MYOCARDIAL INJURY. Repeat  testing in 3-6 hrs if  clinically indicated.  NOTE: An increase or decrease  of 30% or more on serial  testing suggests a  clinically important change)  29-Oct-14 05:03   CK, Total  1853 (Result(s) reported on 12 Jul 2013 at 08:29AM.)  Routine UA:  28-Oct-14 19:00   Color (UA) Colorless  Clarity (UA) Clear  Glucose (UA) Negative  Bilirubin (UA) Negative  Ketones (UA) Negative  Specific Gravity (UA) 1.003  Blood (UA) 1+  pH (UA) 6.0  Protein (UA) Negative  Nitrite (UA) Negative  Leukocyte Esterase (UA) Negative (Result(s) reported on 11 Jul 2013 at 07:18PM.)  RBC (UA) 1 /HPF  WBC (UA) NONE SEEN  Bacteria (UA) TRACE  Epithelial Cells (UA) <1 /HPF (Result(s) reported on 11 Jul 2013 at 07:18PM.)  Routine Coag:  28-Oct-14 16:32   Prothrombin 12.9  INR 1.0 (INR reference interval applies to patients on anticoagulant therapy. A single INR therapeutic range for coumarins is not optimal for all indications; however, the suggested range for most indications is 2.0 - 3.0. Exceptions to the INR Reference Range may include: Prosthetic heart valves, acute myocardial infarction, prevention of myocardial infarction, and combinations of aspirin and anticoagulant. The  need for a higher or lower target INR must be assessed individually. Reference: The Pharmacology and Management of the Vitamin K  antagonists: the seventh ACCP Conference on Antithrombotic and Thrombolytic Therapy. OZDGU.4403 Sept:126 (3suppl): [id number]. A HCT value >55% may artifactually increase the PT.  In one study,  the increase was an average of 25%. Reference:  "Effect on Routine and Special Coagulation Testing Values of Citrate Anticoagulant Adjustment in Patients with High HCT Values." American Journal of Clinical Pathology 2006;126:400-405.)  Routine Hem:  29-Oct-14 05:03   WBC (CBC) 8.5  RBC (CBC)  3.63  Hemoglobin (CBC)  11.3  Hematocrit (CBC)  32.0  Platelet Count (CBC) 223  MCV 88  MCH 31.1  MCHC 35.3  RDW 13.8  Neutrophil % 70.3  Lymphocyte % 15.9  Monocyte % 11.4  Eosinophil % 1.3  Basophil % 1.1  Neutrophil # 6.0  Lymphocyte # 1.4  Monocyte # 1.0  Eosinophil # 0.1  Basophil # 0.1 (Result(s) reported on 12 Jul 2013 at [facility].)   Radiology Results: CT:    28-Oct-14 17:48, CT Head Without Contrast  CT Head Without Contrast   REASON FOR EXAM:    seizure fall head in njury  COMMENTS:       PROCEDURE: CT  - CT HEAD WITHOUT CONTRAST  - Jul 11 2013  5:48PM     RESULT: History: Seizure. Fall.    Comparison Study: Prior CT of 2007.     Findings: No mass. No hydrocephalus. No hemorrhage. Small right frontal   scalp hematoma. No underlying bony abnormality. Dural calcification again   noted.    IMPRESSION:  No acute intercranial abnormality.    Verified By: Osa Craver, M.D., MD   Radiology Impression: Radiology Impression: CT of head personally reviewed by me and normal   Impression/Recommendations: Recommendations:   previous notes reviewed by me reviewed by me   Epilepsy-  given the increased episodes of starring per mother and headaches, pt is probably having uncontrolled seizures.  EEG was abnormal as well. 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  Concerned for possible PRES as cause of headaches and uncontrolled seizures.  This would be secondary to CellCept if present. MRI of brain w/o contrast continue Keppra 1gm BID increase Vimpat to 22m BID if  MRI does not show PRES, pt can be discharged home;  if it does, he will need to go to a transplant center where they can adjust CellCept no driving or operating heavy machinery x 6 months will sign off, please have pt f/u with Dr. Melrose Nakayama in [facility] in 4-6 weeks  Electronic Signatures: Jamison Neighbor (MD)  (Signed 29-Oct-14 12:39)  Authored: REFERRING PHYSICIAN, Primary Care Physician, Consult, History of Present Illness, Review of Systems, PAST MEDICAL/SURGICAL HISTORY, HOME MEDICATIONS, ALLERGIES, Social/Family History, NURSING VITAL SIGNS, Physical Exam-, LAB RESULTS, RADIOLOGY RESULTS, Recommendations   Last Updated: 29-Oct-14 12:39 by Jamison Neighbor (MD)

## 2015-01-04 NOTE — H&P (Signed)
PATIENT NAME:  John Cline, John Cline MR#:  [id number] DATE OF BIRTH:  09/06/1986  DATE OF ADMISSION:  07/11/2013  PRIMARY CARE PROVIDER: Festus Aloe.   ED REFERRING PHYSICIAN: Dr. Benjaman Lobe.   CHIEF COMPLAINT: Recurrent seizures.   HISTORY OF PRESENT ILLNESS: The patient is 29 year old white male with previous history of seizure disorder who is on Vimpat. He also has a history of previous nephrotic syndrome, kidney failure, requiring peritoneal dialysis about 4 years ago and then underwent renal transplant in 2010. He has been on immunosuppressive therapy, who was noted to have tonic-clonic type of seizure activity with loss of consciousness which was witnessed by grandfather. EMS gave him a dose of Versed 5 mg IM and haloperidol 10 mg IM. Then in the ER, the patient had another seizure activity and was given Ativan dose and loaded with Dilantin. The patient is currently a little awake but is refusing to answer many questions. He states that he is sure he has not had a seizure and he is not sure what he is doing in the hospital. He otherwise denies any fevers, chills. No chest pain. No shortness of breath.   PAST MEDICAL HISTORY:  Significant for seizure disorder, history of nephrotic syndrome requiring peritoneal dialysis in the past, status post renal transplant and hypertension.   ALLERGIES: None.   CURRENT MEDICATIONS AT HOME: He is on amlodipine 5 mg 1 tab p.o. b.i.d., cyclosporin 100 one tab p.o. q.12, cyclosporin 25 mg 1 tab p.o. q.12, Vimpat 100 mg 1 tab p.o. b.i.d.   SOCIAL HISTORY: Denies smoking, alcohol or drug use.   FAMILY HISTORY: Positive for hypertension.   REVIEW OF SYSTEMS: Limited due to patient not being compliant with answering questions.  CONSTITUTIONAL: Denies any fevers, pain, weight loss, weight gain.  EYES: No blurred or double vision. No pain. No redness. No inflammation.  ENT: No tinnitus. No ear pain. No hearing loss. No seasonal or year-round allergies. No difficulty  swallowing.  RESPIRATORY: Denies any cough, wheezing, hemoptysis. No COPD.   CARDIOVASCULAR: Denies any chest pain, orthopnea or edema.  GASTROINTESTINAL: No nausea, vomiting, diarrhea. No abdominal pain. No hematemesis.  GENITOURINARY: Denies any dysuria, hematuria, renal calculus or frequency.  ENDOCRINE: Denies any polyuria, nocturia or thyroid problems.  HEMATOLOGIC AND LYMPHATIC: Denies anemia, easy bruisability or bleeding.  SKIN: No acne. No rash. No changes in mole, hair or skin.  MUSCULOSKELETAL: Denies any pain in the neck, back or shoulder.  NEUROLOGIC: History of seizure disorder.  PSYCHIATRIC: Denies anxiety or insomnia.   PHYSICAL EXAMINATION: VITAL SIGNS: Temperature 98.4, pulse 106, respirations 20, blood pressure 124/70, O2 98%.  GENERAL: The patient appears disheveled, in no acute distress.  HEENT: Head atraumatic, normocephalic. Pupils equally round, reactive to light and accommodation. There is no conjunctival pallor, no scleral icterus. Nasal exam shows no drainage or ulceration. Oropharynx is clear without any exudate.  NECK: Supple without any JVD.  CARDIOVASCULAR: Regular rate and rhythm. No murmurs, rubs, clicks or gallops. PMI is not displaced.  LUNGS: Clear to auscultation bilaterally without any rales, rhonchi, wheezing.  ABDOMEN: Soft, nontender, nondistended. Positive bowel sounds x 4.  EXTREMITIES: No clubbing, cyanosis, edema.  SKIN: No rash.  LYMPHATICS: No lymph nodes palpable.  VASCULAR:  Good DP and PT pulses.  PSYCHIATRIC: Not anxious or depressed.  NEUROLOGIC: Awake, alert, not oriented to place but oriented to person and time. Cranial nerves II through XII grossly intact. No focal deficits.   LABORATORY AND RADIOLOGICAL DATA:  In the ED, CT scan  of the head is negative. Glucose 106, BUN 39, creatinine 2.39, sodium 125, potassium 3.4, chloride 94, CO2 is 14. LFTs: AST shows 45. Troponin less than 0.02. WBC 9.1, hemoglobin 12, platelet count is 248. INR  is 1. CT scan of the head shows no acute intracranial processes.   ASSESSMENT AND PLAN: The patient is a 29 year old white male with history of seizure disorder, last seizure according to him in April. Also has had a renal transplant, was noticed to have hyponatremia as well.  1.  Seizure. The patient reports compliant with Vimpat. We will continue this medication. He has been given Dilantin load. I will go ahead and start him on Keppra 1000 b.i.d. IV for the time being. Will have neurology evaluation and EEG. Follow up on the toxic urine drug screen, alcohol level. His hyponatremia could be related to a seizure but he does have a history of seizure disorder and the sodium is not severe enough that it would cause seizure like that.  2.  Hyponatremia, duration unknown. Will give him IV fluids with normal saline, check serum and urine osmolality,  Will check a TSH as well.  3.  Status post renal transplant. Continue cyclosporin as taking at home.  4.  Hypokalemia. We will replace his potassium.  5.  Miscellaneous. Will place him on heparin for deep vein thrombosis prophylaxis.   TIME SPENT: 50 minutes spent.     ____________________________ Lafonda Mosses. Posey Pronto, MD shp:cs D: 07/11/2013 19:09:35 ET T: 07/11/2013 19:38:40 ET JOB#: [id number]  cc: Pearlina Friedly H. Posey Pronto, MD, <Dictator> Alric Seton MD ELECTRONICALLY SIGNED 07/14/2013 8:30

## 2015-01-05 NOTE — H&P (Signed)
PATIENT NAME:  John Cline, John Cline MR#:  [id number] DATE OF BIRTH:  10/01/1985  DATE OF ADMISSION:  10/12/2013  ADMITTING PHYSICIAN:  Gladstone Lighter, M.D.   PRIMARY CARE PHYSICIAN:  At [facility].   CHIEF COMPLAINT:  Abdominal pain and diarrhea.   HISTORY OF PRESENT ILLNESS:  Mr. Ballerini is a 29 year old young Caucasian male with a past medical history significant for nephrotic syndrome and end-stage renal disease status post renal transplant 4 years ago and currently off of dialysis. He still has CKD stage III at this time with baseline creatinine around 2. He also has a history of seizure disorder and hypertension, who lives at home, presents to the hospital secondary to onset of abdominal pain and diarrhea for 4 days now. The patient is a poor historian. He states he has been having abdominal pain and green, mushy stools about 2 to 3 times a day for the past 4 days.  He denies any recent travel, was not  exposed to anybody sick or did not eat anything outside. He states he is nauseous and has dry heaves but has not vomited. His p.o. intake has been decreased over the last couple of days. He denies any fevers or chills at this time, but his main complaint is left upper and lower quadrant severe abdominal pain. In the ER, the patient received Dilaudid for pain and has been requesting frequently to get IV Dilaudid.   PAST MEDICAL HISTORY: 1.  Nephrotic syndrome status post renal transplant, currently off of dialysis.  2.  Seizure disorder.  3.  CKD stage III with baseline creatinine around 2.  4.  History of hyponatremia in the past.  5.  Hypertension.  PAST SURGICAL HISTORY: 1.  Dialysis catheter placement, both PermCath and peritoneal dialysis catheter placement and removal.  2.  Renal transplant surgery.   ALLERGIES TO MEDICATIONS: MORPHINE.   CURRENT HOME MEDICATIONS:  1.  Amlodipine 5 mg p.o. b.i.d.  2.  Calcitrol 0.25 mcg p.o. daily.  3.  Cyclosporin 100 mg p.o. b.i.d.  4.  Cyclosporin 25  mg p.o. b.i.d. along with 100 mg tablet.  5.  Ferrous sulfate 325 mg p.o. b.i.d.  6.  Gabapentin 100 mg in the morning and 200 mg in the evening.  7.  Keppra 1000 mg p.o. b.i.d.  8.  Metoprolol tartrate 75 mg p.o. b.i.d.  9.  Prednisone 5 mg p.o. daily.  10.  Sensipar 30 mg p.o. daily.  11.  Vimpat 200 mg p.o. b.i.d.  12.  Vitamin D2, 50,000 International Units once a week on Wednesdays.   SOCIAL HISTORY: Lives at home by himself. He used to live with a grandmother who just passed away. No history of any smoking or alcohol use.   FAMILY HISTORY: Grandmother died from pancreatic cancer. Does not know about his parents' medical problems.     REVIEW OF SYSTEMS:   CONSTITUTIONAL: No fever, fatigue or weakness.  EYES: No blurred vision, double vision, inflammation or glaucoma.  ENT: No tinnitus, ear pain, hearing loss, epistaxis or discharge.  RESPIRATORY: No cough, wheeze, hemoptysis or COPD.  CARDIOVASCULAR: No chest pain, orthopnea, edema, arrhythmia, palpitations or syncope.  GASTROINTESTINAL: Positive for nausea, dry heaves. Positive for abdominal pain and diarrhea. No constipation, hematemesis. No rectal bleed.  GENITOURINARY: No dysuria, hematuria, renal calculus, frequency or incontinence.  ENDOCRINE: No polyuria, nocturia, thyroid problems, heat or cold intolerance.  HEMATOLOGY: Positive for history of anemia. No easy bruising or bleeding.  SKIN: No acne, rash or lesions.  MUSCULOSKELETAL:  No neck, back, shoulder pain, arthritis or gout.  NEUROLOGIC: No numbness, weakness, CVA, TIA or ataxia. History of seizures present.  PSYCHOLOGICAL: No anxiety, insomnia or depression.   PHYSICAL EXAMINATION: VITAL SIGNS: Temperature 97 degrees Fahrenheit, pulse 52, respirations 18, blood pressure 120/80, pulse ox 98% on room air.  GENERAL EXAM: A thin-built, well-nourished male lying in bed in mild distress secondary to abdominal pain.  HEENT: Normocephalic, atraumatic. Pupils equal, round,  reacting to light. Anicteric sclerae. Extraocular movements intact. Oropharynx is clear without erythema, mass or exudates.  NECK: Supple. No thyromegaly, JVD or carotid bruits. No lymphadenopathy.  LUNGS: Moving air bilaterally. No wheeze or crackles. No use of accessory muscles for breathing.  CARDIOVASCULAR: S1, S2 regular rate and rhythm. No murmurs, rubs or gallops.  ABDOMEN: Soft, scaphoid. Some tenderness with voluntary guarding in left upper and left lower quadrants.  No rebound tenderness or rigidity.  Normal bowel sounds are present. No hepatosplenomegaly noted.  EXTREMITIES: No pedal edema. No clubbing or cyanosis, 2+ dorsalis pedis pulses palpable bilaterally.  SKIN: No acne, rash or lesions.  LYMPHATICS: No cervical lymphadenopathy.  NEUROLOGIC: Cranial nerves intact. No focal motor or sensory deficits.  PSYCHIATRIC:  The patient is awake, alert, oriented x 3.   LABORATORY DATA: WBC is 10.1, hemoglobin 11.7, hematocrit 33.1, platelet count is 255.   Sodium 125, potassium 4.3, chloride 98, bicarb 19, BUN 31, creatinine 1.87 and glucose of 92 and calcium of 8.0.  His GFR is 48.   URINALYSIS:  Negative for protein. Negative for an infection. Lipase is slightly elevated at 527.   ASSESSMENT AND PLAN: This is a 29 year old male with history of nephrotic syndrome status post renal transplant, chronic kidney disease stage III, seizure disorder, hypertension, comes with abdominal pain and diarrhea for 4 days.  1.  Abdominal pain and diarrhea, could be viral gastroenteritis versus bacteria versus mild pancreatitis. The minimal lipase elevation could be either from the pancreatitis versus just reactive elevation from intra-abdominal inflammation.  We will keep him on clear liquids for now. Abdominal ultrasound has been ordered. Pain medications p.r.n.  Bentyl also started for abdominal cramping pain. Stool studies have been ordered and empirically started on Flagyl for now. Nausea meds p.r.n.   2.  Hyponatremia secondary to dehydration. Gentle IV fluids and monitor.  3.  Chronic kidney disease stage III. Creatinine at baseline. Follows at [facility] and status post renal transplant. Continue his transplant meds.  4.  Hypertension. Continue home meds.  5.  Seizure disorder. No active seizures. Continue his home medications.   CODE STATUS: FULL CODE.   TIME SPENT ON ADMISSION:  50 minutes.     ____________________________ Gladstone Lighter, MD rk:dmm D: 10/12/2013 17:56:44 ET T: 10/12/2013 19:20:20 ET JOB#: [id number]  cc: Gladstone Lighter, MD, <Dictator> Gladstone Lighter MD ELECTRONICALLY SIGNED 10/19/2013 14:54

## 2015-01-05 NOTE — Consult Note (Signed)
Brief Consult Note: Diagnosis: Recurrent pancreatitis.   Patient was seen by consultant.   Consult note dictated.   Comments: John Cline is a 29 y/o caucasian male with hx of nephrotic syndrome s/p 2 renal transplants with elevated creatinine on cyclosporin admitted with recurrent pancreatitis.  Lipase 1218.  He tells me he has been admitted to [facility] 7 times with pancreatitis initially due to ETOH, but he no longer drinks ETOH.  Pancreatitis could be secondary to cyclosporin or idiopathic.  He is unsure whether he has had an EUS to look for small pancreatic lesions.  He has also had diarrhea & hx c diff years ago.  Plan: 1) Nephrology consult to consider changing cyclosporin 2) aggressive IVFs 3) advance to clear liquid diet as tolerated 4) FU stool studies 5) antiemetics/pain control/PPI for gastric protection Thanks for consult.  Please see full dictated note. [id number].  Electronic Signatures: Andria Meuse (NP)  (Signed 30-Jan-15 20:04)  Authored: Brief Consult Note   Last Updated: 30-Jan-15 20:04 by Andria Meuse (NP)

## 2015-01-05 NOTE — Discharge Summary (Signed)
PATIENT NAME:  John Cline, John Cline MR#:  [id number] DATE OF BIRTH:  [birth year]  DATE OF ADMISSION:  10/14/2013 DATE OF DISCHARGE:  10/15/2013  The patient walked out against medical advice on 10/15/2013.   CONSULTANTS: Dr. Allen Norris from gastroenterology.  PRIMARY CARE PHYSICIAN: At [facility].   CHIEF COMPLAINT: Abdominal pain, diarrhea.  DISCHARGE DIAGNOSES:  1.  Acute on chronic pancreatitis, likely secondary to noncompliance with Creon. 2.  Hyponatremia.  3.  Chronic abdominal pain.  4.  Status post renal transplant for nephrotic syndrome, currently chronic kidney disease stage III.  5.  Hypertension.  6.  Seizure disorder.  7.  History of hyponatremia in the past.   SIGNIFICANT LABORATORY AND DIAGNOSTICS: Initial lipase 527, peak lipase 1218, last lipase 258. Initial sodium 125, last sodium 136. Initial creatinine was 1.87 with BUN of 31, last creatinine of 1.9. LFTs on January 29th within normal limits. White count was 10.1. UA did not suggest infection. Keppra level within therapeutic window. Cyclosporin level is pending.   Ultrasound of the abdomen, general survey, had showed no acute findings by ultrasound, status post bilateral nephrectomies, left lower quadrant renal transplant.   Abdomen, 3-way, including PA chest x-ray showing no acute cardiopulmonary process, nonspecific bowel gas pattern, and calcification projecting over the right iliac bone could reflect kidney stone and pelvic renal transplant.   CT head without contrast: No acute intracranial abnormalities.   HISTORY OF PRESENT ILLNESS AND HOSPITAL COURSE: For full details of H and P, please see the dictation on January 29 by Dr. Tressia Miners, but briefly this is a 29 year old male with history of nephrotic syndrome, end-stage renal disease status post renal transplant 4 years ago, who is not on dialysis currently, who came secondary to abdominal pain and diarrhea for 4 days. The patient stated he has been having abdominal pain and  green mushy stools for about 2 to 3 times a day for the past 4 days without any complaints of fevers or chills. He received Dilaudid for pain and has been requesting frequent Dilaudid, per HPI. He was admitted for hypernatremia and possibly acute on chronic pancreatitis given the borderline elevation of lipase on admission. Abdominal ultrasound was done the result of which is above. He was made n.p.o. as the lipase had gone up to 1200 range suggesting acute pancreatitis. The patient has  history of pancreatitis in the past and has had multiple hospitalizations per him up at [facility] and also [facility]. The patient has his transplant physician at [facility]. He was contacted for the possibility of cyclosporin-induced pancreatitis. Per the [facility] physicians, he had history of mild pancreatitis thought to be secondary to cyclosporin, but pancreatitis would resolve on its own and lipase would be the 400s, per Dr. Vianne Bulls, who had discussed the case with him. The nephrology department at [facility] was called, and I discussed the case with Dr. Domingo Mend on 2 different occasions. A cyclosporin level was drawn to see if it was in the therapeutic window. However, the lipase did normalize and the patient walked out against medical advice prior to the lab coming back.   On 1st of February, he was tolerating his diet, which was advanced, and his narcotics had been down-titrated as he had felt better. At this point, the cyclosporin level is not back yet. Of note, per [facility] nephrology, he was admitted for pancreatitis at [facility] and he walked out against medical advice there on the 31st of December. The patient likely has chronic pancreatitis and do not know  if this is cyclosporine-induced, but he was supposed to be on Creon, which he is noncompliant with and I suspect that noncompliant with Creon is more likely than cyclosporine to have caused the pancreatitis, but per [facility] nephrology we are to continue the cyclosporine at this  time.   TOTAL TIME SPENT: 40 minutes.  ____________________________ Vivien Presto, MD sa:sb D: 10/16/2013 08:20:47 ET T: 10/16/2013 11:00:16 ET JOB#: [id number]  cc: Vivien Presto, MD, <Dictator> Vivien Presto MD ELECTRONICALLY SIGNED 10/28/2013 10:54

## 2015-01-05 NOTE — Consult Note (Signed)
[facility]will be happy to evaluate if there is an urgent concern   Electronic Signatures: Murlean Iba (MD)  (Signed on 31-Jan-15 10:59)  Authored  Last Updated: 31-Jan-15 10:59 by Murlean Iba (MD)

## 2015-01-05 NOTE — Consult Note (Signed)
Brief Consult Note: Diagnosis: Pancreatitis.   Patient was seen by consultant.   Comments: Patient seen and agree with the plan of my NP. The patient is angry he was not taken to [facility] and is not answering qustions.  Electronic Signatures: Lucilla Lame (MD)  (Signed 30-Jan-15 22:33)  Authored: Brief Consult Note   Last Updated: 30-Jan-15 22:33 by Lucilla Lame (MD)

## 2015-01-05 NOTE — Consult Note (Signed)
PATIENT NAME:  John Cline, OBROCHTA MR#:  [id number] DATE OF BIRTH:  Sep 07, 1986  DATE OF CONSULTATION:  10/13/2013  REFERRING PHYSICIAN:  Epifanio Lesches, MD CONSULTING PHYSICIAN:  Andria Meuse, NP CONSULTING GASTROENTEROLOGIST: Lucilla Lame, MD  PRIMARY CARE PHYSICIAN: [facility].   NEPHROLOGIST: Dr. Toy Cookey at [facility].   REASON FOR CONSULTATION: Recurrent pancreatitis.   HISTORY OF PRESENT ILLNESS: [name] is a 29 year old Caucasian male who has a history of chronic recurrent pancreatitis. He tells me he has been admitted at least 7 times to [facility]. He is followed by Dr. Toy Cookey at [facility] for history of nephrotic syndrome and chronic kidney disease, status post 2 renal transplants. He has a history of seizure disorder and high blood pressure. He also had C. difficile in 2011. He tells me that he has had left upper quadrant pain that radiates to his back for the last 2 weeks. The pain is 10 out of 10 at worst. He had a very poor appetite. He has also had loose yellowish stools for the past week. He has had nausea with dry heaves. Denies any emesis. He denies any fever. He last consumed alcohol in December 2014. His initial bout of pancreatitis many years ago was felt to be due to alcohol; however, he has since stopped drinking. He is unsure whether he has had an endoscopic ultrasound of his pancreas. He believes he had a colonoscopy and EGD at [facility], but cannot tell me the specifics. He has noticed some dark stools, but is unsure whether there is blood in his stools. His lipase was 1218 today. His creatinine was 1.9. He had a normal white blood cell count and a hemoglobin of 11.7. His weight has remained stable.   PAST MEDICAL AND SURGICAL HISTORY:   1.  Recurrent/chronic pancreatitis.  2.  Nephrotic syndrome, status post 2 renal transplants, followed by [facility]. 3.  Seizure disorder. 4.  Chronic kidney disease. 5.  Hyponatremia. 6.  Hypertension. 7.  Rhabdomyolysis.  8.  C. difficile in 2011. 9.   Dialysis catheter placement.   MEDICATIONS PRIOR TO ADMISSION: Amlodipine 5 mg b.i.d., calcitriol 0.25 mcg daily, cyclosporine 100 mg b.i.d., cyclosporine 25 mg b.i.d., ferrous sulfate 325 mg b.i.d., gabapentin 100 mg in the a.m. and 200 mg in the evening, Keppra 1 gram p.o. b.i.d., metoprolol 75 mg b.i.d., prednisone 5 mg daily, Sensipar 30 mg daily, Vimpat 200 mg b.i.d., vitamin D 50,000 international units once a week on Wednesdays.   ALLERGIES: MORPHINE.   SOCIAL HISTORY: He is disabled. He lives at home by himself. His grandmother passed away within the last 2 weeks. He quit smoking about 10 years ago. He does have a girlfriend of 4 years. He denies any alcohol use or illicit drug use.   FAMILY HISTORY: His grandmother deceased from pancreatic cancer. He has one healthy sister.   REVIEW OF SYSTEMS: See HPI.  CONSTITUTIONAL: He is having some fatigue and weakness. Otherwise negative 12-point review of systems.   PHYSICAL EXAMINATION: VITAL SIGNS: Temperature 98.4, pulse 71, respirations 18, blood pressure 132/84, oxygen saturation 100% on room air.  GENERAL: He is a thin Caucasian male who is alert, oriented, pleasant, cooperative, in no acute distress.  HEENT: Sclerae clear, anicteric. Conjunctivae pink. Oropharynx pink and moist without any lesions.  NECK: Supple without mass or thyromegaly.  HEART: Regular rate and rhythm. No murmurs, rubs, clicks, or gallops.  LUNGS: Clear to auscultation bilaterally.  ABDOMEN: Positive bowel sounds x 4. No bruits auscultated. He does have moderate  tenderness to the epigastrium and left upper quadrant and left lower quadrant on deep palpation. There is no rebound tenderness or guarding.  EXTREMITIES: Without clubbing or edema.  SKIN: Pink, warm and dry, without any rash or jaundice.  NEUROLOGIC: Grossly intact.  PSYCHIATRIC: He is alert, cooperative. He is mildly argumentative when he answers questions. He states he is frustrated that we are asking  him the same questions, and all of his medical records would be at [facility]. He is upset that he was not taking there.   LABORATORY AND RADIOLOGICAL DATA: See HPI. He had a BUN of 24, creatinine 1.9, chloride 109. LFTs normal. Platelets 25. Abdominal ultrasound, 01/29, showed status post bilateral nephrectomies, left lower quadrant renal transplant. He had a negative head CT. He had a 3-way of the abdomen, which showed a nonspecific bowel gas pattern, calcification of the right iliac bone, questionable stone.   IMPRESSION: John Cline is a 29 year old Caucasian male with a history of nephrotic syndrome, status post 2 renal transplants with elevated creatinine, on cyclosporine, admitted with recurrent pancreatitis. Lipase 1218. He tells me he has been admitted to [facility] 7 times with pancreatitis, initially due to alcohol but he no longer drinks alcohol. Pancreatitis could be secondary to cyclosporine or idiopathic. He is unsure whether he has had an EUS to look for small pancreatic lesions. He has also had diarrhea and a history of Clostridium difficile years ago.   PLAN: 1.  Nephrology consult to consider changing cyclosporin.  2.  Aggressive IV fluids.  3.  Advance to clear liquid diet as tolerated.  4.  Follow up stool studies.  5.  Antiemetics, pain control, and PPI for gastric protection.   We would like to thank you for allowing [place] to participate in the care of Mr. Whiten.   ____________________________ Andria Meuse, NP klj:jcm D: 10/13/2013 20:13:11 ET T: 10/13/2013 20:44:42 ET JOB#: [id number]  cc: Andria Meuse, NP, <Dictator> [facility], Dr. Blossom Hoops FNP ELECTRONICALLY SIGNED 10/16/2013 11:26

## 2015-07-30 DIAGNOSIS — G40909 Epilepsy, unspecified, not intractable, without status epilepticus: Secondary | ICD-10-CM | POA: Insufficient documentation

## 2015-11-28 DIAGNOSIS — G40219 Localization-related (focal) (partial) symptomatic epilepsy and epileptic syndromes with complex partial seizures, intractable, without status epilepticus: Secondary | ICD-10-CM | POA: Insufficient documentation

## 2015-11-29 DIAGNOSIS — G44221 Chronic tension-type headache, intractable: Secondary | ICD-10-CM | POA: Insufficient documentation

## 2016-03-07 DIAGNOSIS — R5381 Other malaise: Secondary | ICD-10-CM | POA: Insufficient documentation

## 2016-03-07 DIAGNOSIS — R5383 Other fatigue: Secondary | ICD-10-CM | POA: Insufficient documentation

## 2016-03-07 DIAGNOSIS — E875 Hyperkalemia: Secondary | ICD-10-CM | POA: Insufficient documentation

## 2016-05-13 DIAGNOSIS — R34 Anuria and oliguria: Secondary | ICD-10-CM | POA: Insufficient documentation

## 2017-04-11 DIAGNOSIS — R748 Abnormal levels of other serum enzymes: Secondary | ICD-10-CM | POA: Insufficient documentation

## 2017-07-03 DIAGNOSIS — N179 Acute kidney failure, unspecified: Secondary | ICD-10-CM | POA: Insufficient documentation

## 2017-10-26 DIAGNOSIS — A0472 Enterocolitis due to Clostridium difficile, not specified as recurrent: Secondary | ICD-10-CM | POA: Insufficient documentation

## 2017-12-08 DIAGNOSIS — T8691 Unspecified transplanted organ and tissue rejection: Secondary | ICD-10-CM | POA: Insufficient documentation

## 2017-12-14 DIAGNOSIS — F6089 Other specific personality disorders: Secondary | ICD-10-CM | POA: Insufficient documentation

## 2018-01-05 DIAGNOSIS — J189 Pneumonia, unspecified organism: Secondary | ICD-10-CM | POA: Insufficient documentation

## 2018-01-20 DIAGNOSIS — R609 Edema, unspecified: Secondary | ICD-10-CM | POA: Insufficient documentation

## 2018-06-06 ENCOUNTER — Other Ambulatory Visit: Payer: Self-pay

## 2018-06-06 ENCOUNTER — Emergency Department: Payer: Medicare Other

## 2018-06-06 ENCOUNTER — Emergency Department
Admission: EM | Admit: 2018-06-06 | Discharge: 2018-06-07 | Disposition: A | Discharge status: Other Acute Inpatient Hospital | Payer: Medicare Other | Attending: Emergency Medicine | Admitting: Emergency Medicine

## 2018-06-06 ENCOUNTER — Encounter: Payer: Self-pay | Admitting: Emergency Medicine

## 2018-06-06 DIAGNOSIS — E875 Hyperkalemia: Secondary | ICD-10-CM | POA: Insufficient documentation

## 2018-06-06 DIAGNOSIS — I129 Hypertensive chronic kidney disease with stage 1 through stage 4 chronic kidney disease, or unspecified chronic kidney disease: Secondary | ICD-10-CM | POA: Diagnosis not present

## 2018-06-06 DIAGNOSIS — R55 Syncope and collapse: Secondary | ICD-10-CM | POA: Diagnosis present

## 2018-06-06 DIAGNOSIS — N189 Chronic kidney disease, unspecified: Secondary | ICD-10-CM | POA: Diagnosis not present

## 2018-06-06 DIAGNOSIS — N289 Disorder of kidney and ureter, unspecified: Secondary | ICD-10-CM

## 2018-06-06 DIAGNOSIS — Z940 Kidney transplant status: Secondary | ICD-10-CM | POA: Insufficient documentation

## 2018-06-06 HISTORY — DX: Unspecified atrial fibrillation: I48.91

## 2018-06-06 HISTORY — DX: Essential (primary) hypertension: I10

## 2018-06-06 HISTORY — DX: Unspecified convulsions: R56.9

## 2018-06-06 LAB — COMPREHENSIVE METABOLIC PANEL
ALBUMIN: 3.8 g/dL (ref 3.5–5.0)
ALT: 22 U/L (ref 0–44)
ANION GAP: 6 (ref 5–15)
AST: 27 U/L (ref 15–41)
Alkaline Phosphatase: 28 U/L — ABNORMAL LOW (ref 38–126)
BILIRUBIN TOTAL: 0.7 mg/dL (ref 0.3–1.2)
BUN: 82 mg/dL — ABNORMAL HIGH (ref 6–20)
CO2: 21 mmol/L — ABNORMAL LOW (ref 22–32)
Calcium: 8.7 mg/dL — ABNORMAL LOW (ref 8.9–10.3)
Chloride: 107 mmol/L (ref 98–111)
Creatinine, Ser: 3.13 mg/dL — ABNORMAL HIGH (ref 0.61–1.24)
GFR calc non Af Amer: 24 mL/min — ABNORMAL LOW (ref >60)
GFR, EST AFRICAN AMERICAN: 28 mL/min — AB (ref >60)
GLUCOSE: 92 mg/dL (ref 70–99)
POTASSIUM: 7 mmol/L — AB (ref 3.5–5.1)
SODIUM: 134 mmol/L — AB (ref 135–145)
TOTAL PROTEIN: 6.3 g/dL — AB (ref 6.5–8.1)

## 2018-06-06 LAB — BASIC METABOLIC PANEL
ANION GAP: 5 (ref 5–15)
BUN: 78 mg/dL — ABNORMAL HIGH (ref 6–20)
CALCIUM: 8.3 mg/dL — AB (ref 8.9–10.3)
CO2: 20 mmol/L — AB (ref 22–32)
Chloride: 112 mmol/L — ABNORMAL HIGH (ref 98–111)
Creatinine, Ser: 2.98 mg/dL — ABNORMAL HIGH (ref 0.61–1.24)
GFR, EST AFRICAN AMERICAN: 30 mL/min — AB (ref >60)
GFR, EST NON AFRICAN AMERICAN: 26 mL/min — AB (ref >60)
Glucose, Bld: 61 mg/dL — ABNORMAL LOW (ref 70–99)
Potassium: 5.1 mmol/L (ref 3.5–5.1)
SODIUM: 137 mmol/L (ref 135–145)

## 2018-06-06 LAB — URINALYSIS, COMPLETE (UACMP) WITH MICROSCOPIC
BILIRUBIN URINE: NEGATIVE
Glucose, UA: NEGATIVE mg/dL
KETONES UR: NEGATIVE mg/dL
LEUKOCYTES UA: NEGATIVE
Nitrite: NEGATIVE
PH: 6 (ref 5.0–8.0)
Protein, ur: 30 mg/dL — AB
SQUAMOUS EPITHELIAL / LPF: NONE SEEN (ref 0–5)
Specific Gravity, Urine: 1.01 (ref 1.005–1.030)

## 2018-06-06 LAB — CBC
HCT: 29.5 % — ABNORMAL LOW (ref 40.0–52.0)
Hemoglobin: 10.4 g/dL — ABNORMAL LOW (ref 13.0–18.0)
MCH: 36.2 pg — AB (ref 26.0–34.0)
MCHC: 35.1 g/dL (ref 32.0–36.0)
MCV: 103 fL — ABNORMAL HIGH (ref 80.0–100.0)
PLATELETS: 172 10*3/uL (ref 150–440)
RBC: 2.87 MIL/uL — ABNORMAL LOW (ref 4.40–5.90)
RDW: 15.6 % — AB (ref 11.5–14.5)
WBC: 5.8 10*3/uL (ref 3.8–10.6)

## 2018-06-06 LAB — GLUCOSE, CAPILLARY
GLUCOSE-CAPILLARY: 80 mg/dL (ref 70–99)
GLUCOSE-CAPILLARY: 75 mg/dL (ref 70–99)

## 2018-06-06 LAB — TROPONIN I

## 2018-06-06 MED ORDER — LIDOCAINE HCL (PF) 1 % IJ SOLN
INTRAMUSCULAR | Status: AC
  Administered 2018-06-07: 5 mL via INTRADERMAL
  Filled 2018-06-06: qty 5

## 2018-06-06 MED ORDER — FENTANYL CITRATE (PF) 100 MCG/2ML IJ SOLN
50.0000 ug | Freq: Once | INTRAMUSCULAR | Status: AC
  Administered 2018-06-06: 50 ug via INTRAVENOUS
  Filled 2018-06-06: qty 2

## 2018-06-06 MED ORDER — LIDOCAINE HCL (PF) 1 % IJ SOLN
5.0000 mL | Freq: Once | INTRAMUSCULAR | Status: AC
  Administered 2018-06-07: 5 mL via INTRADERMAL

## 2018-06-06 MED ORDER — INSULIN ASPART 100 UNIT/ML ~~LOC~~ SOLN
10.0000 [IU] | Freq: Once | SUBCUTANEOUS | Status: AC
  Administered 2018-06-06: 10 [IU] via INTRAVENOUS
  Filled 2018-06-06: qty 1

## 2018-06-06 MED ORDER — SODIUM CHLORIDE 0.9 % IV BOLUS
1000.0000 mL | Freq: Once | INTRAVENOUS | Status: AC
  Administered 2018-06-06: 1000 mL via INTRAVENOUS

## 2018-06-06 MED ORDER — DEXTROSE 50 % IV SOLN
25.0000 g | Freq: Once | INTRAVENOUS | Status: AC
  Administered 2018-06-06: 25 g via INTRAVENOUS
  Filled 2018-06-06: qty 50

## 2018-06-06 NOTE — ED Provider Notes (Signed)
[facility] Emergency Department Provider Note  Time seen: 7:58 PM  I have reviewed the triage vital signs and the nursing notes.   HISTORY  Chief Complaint Loss of Consciousness    HPI John Skeels. is a 32 y.o. male the past medical history of hypertension, seizure disorder, kidney transplant 2010 presents to the emergency department after a syncopal episode.  According to the patient he began feeling very lightheaded and fell hitting his head, believes the patient completely passed out prior to hitting his head.  Had a laceration above the right eye.  Patient describing moderate pain around the site of laceration.  Patient is a kidney transplant patient with nephrology care at [facility].   Past Medical History:  Diagnosis Date  . Atrial fibrillation ([place])   . Hypertension   . Seizures ([place])     There are no active problems to display for this patient.   Past Surgical History:  Procedure Laterality Date  . KIDNEY TRANSPLANT      Prior to Admission medications   Not on File    Allergies  Allergen Reactions  . Morphine And Related Rash    No family history on file.  Social History Social History   Tobacco Use  . Smoking status: Never Smoker  Substance Use Topics  . Alcohol use: Not on file  . Drug use: Not on file    Review of Systems Constitutional: Negative for fever. Eyes: Negative for visual complaints ENT: Laceration above right eyebrow Cardiovascular: Negative for chest pain. Respiratory: Negative for shortness of breath. Gastrointestinal: Negative for abdominal pain Genitourinary: Has noted decreased urine output times several weeks per patient. Musculoskeletal: Negative for musculoskeletal complaints Skin: Laceration to right eyebrow/right forehead. Neurological: Positive for head pain around laceration site. All other ROS negative  ____________________________________________   PHYSICAL EXAM:  VITAL SIGNS: ED  Triage Vitals  Enc Vitals Group     BP 06/06/18 1811 (!) 142/103     Pulse Rate 06/06/18 1811 84     Resp 06/06/18 1811 18     Temp 06/06/18 1811 98 F (36.7 C)     Temp Source 06/06/18 1811 Oral     SpO2 06/06/18 1811 94 %     Weight 06/06/18 1813 145 lb (65.8 kg)     Height 06/06/18 1813 5\' 10"  (1.778 m)     Head Circumference --      Peak Flow --      Pain Score 06/06/18 1813 9     Pain Loc --      Pain Edu? --      Excl. in [place]? --    Constitutional: Alert and oriented. Well appearing and in no distress. Eyes: Normal exam ENT   Head: 2 cm laceration above right eyebrow, hemostatic but slightly gaping.   Mouth/Throat: Mucous membranes are moist. Cardiovascular: Normal rate, regular rhythm. No murmur Respiratory: Normal respiratory effort without tachypnea nor retractions. Breath sounds are clear  Gastrointestinal: Soft and nontender. No distention.   Musculoskeletal: Nontender with normal range of motion in all extremities. Neurologic:  Normal speech and language. No gross focal neurologic deficits Skin:  Skin is warm, dry and intact.  Psychiatric: Mood and affect are normal.   ____________________________________________    EKG  EKG reviewed and interpreted by myself shows a sinus rhythm at 87 bpm with occasional loss of QRS complex, largely normal intervals, patient does have peaked-appearing T waves.  ____________________________________________    RADIOLOGY  CT scans are  negative  ____________________________________________   INITIAL IMPRESSION / ASSESSMENT AND PLAN / ED COURSE  Pertinent labs & imaging results that were available during my care of the patient were reviewed by me and considered in my medical decision making (see chart for details).  Patient presents to the emergency department after syncopal episode and head injury.  Laceration will require stitches.  Patient's lab work has resulted showing significant elevation of potassium at 7.0,  elevation of creatinine from a baseline around 2.4 to currently 3.1.  Given the peaked T waves and possible syncopal episode we will dose IV insulin and glucose continue to closely monitor on telemetry, dose IV fluids.  We will repair the laceration.  Ultimately patient will require transfer to [facility] where his nephrologist is located.  Patient agreeable to plan of care.  Is requesting pain medication due to pain around laceration site.  We will dose 50 mcg of fentanyl for the patient.  We will check hourly CBG.  Patient's recheck a BMP potassium is down to 5.1.  Patient has become diaphoretic repeat blood glucose is 61.  Given juice.  Discussed the patient with [facility] they have accepted ED to ED transfer.  Discussed this with the patient who is agreeable to plan of care.  CRITICAL CARE Performed by: Harvest Dark   Total critical care time: 30 minutes  Critical care time was exclusive of separately billable procedures and treating other patients.  Critical care was necessary to treat or prevent imminent or life-threatening deterioration.  Critical care was time spent personally by me on the following activities: development of treatment plan with patient and/or surrogate as well as nursing, discussions with consultants, evaluation of patient's response to treatment, examination of patient, obtaining history from patient or surrogate, ordering and performing treatments and interventions, ordering and review of laboratory studies, ordering and review of radiographic studies, pulse oximetry and re-evaluation of patient's condition.   ____________________________________________   FINAL CLINICAL IMPRESSION(S) / ED DIAGNOSES  Hyperkalemia Syncope    Harvest Dark, MD 06/06/18 2307

## 2018-06-06 NOTE — ED Notes (Signed)
Pt diaphoretic and shaking. Checked glucose 75. MD notified and at bedside.

## 2018-06-06 NOTE — ED Triage Notes (Signed)
States was resting on bed, got up and had syncopal episode, hit R head on piece of furniture. Laceration R brow.

## 2018-06-07 DIAGNOSIS — E875 Hyperkalemia: Secondary | ICD-10-CM | POA: Diagnosis not present

## 2018-06-07 DIAGNOSIS — R601 Generalized edema: Secondary | ICD-10-CM | POA: Insufficient documentation

## 2018-06-07 MED ORDER — FENTANYL CITRATE (PF) 100 MCG/2ML IJ SOLN
50.0000 ug | Freq: Once | INTRAMUSCULAR | Status: AC
  Administered 2018-06-07: 50 ug via INTRAVENOUS
  Filled 2018-06-07: qty 2

## 2018-06-07 NOTE — ED Notes (Signed)
EMTALA and Medical Necessity documentation reviewed at this time and found to be complete per policy. 

## 2018-06-07 NOTE — ED Notes (Signed)
EMS called and I spoke with John Cline.

## 2018-08-03 DIAGNOSIS — H4720 Unspecified optic atrophy: Secondary | ICD-10-CM | POA: Insufficient documentation

## 2018-08-03 DIAGNOSIS — H5213 Myopia, bilateral: Secondary | ICD-10-CM | POA: Insufficient documentation

## 2018-08-03 DIAGNOSIS — H1013 Acute atopic conjunctivitis, bilateral: Secondary | ICD-10-CM | POA: Insufficient documentation

## 2018-11-04 DIAGNOSIS — H02883 Meibomian gland dysfunction of right eye, unspecified eyelid: Secondary | ICD-10-CM | POA: Insufficient documentation

## 2019-02-09 DIAGNOSIS — E8770 Fluid overload, unspecified: Secondary | ICD-10-CM | POA: Insufficient documentation

## 2019-03-14 DIAGNOSIS — R531 Weakness: Secondary | ICD-10-CM | POA: Insufficient documentation

## 2019-03-26 ENCOUNTER — Emergency Department (HOSPITAL_COMMUNITY): Payer: Medicare Other

## 2019-03-26 ENCOUNTER — Other Ambulatory Visit: Payer: Self-pay

## 2019-03-26 ENCOUNTER — Inpatient Hospital Stay (HOSPITAL_COMMUNITY)
Admission: EM | Admit: 2019-03-26 | Discharge: 2019-03-31 | DRG: 640 | Disposition: A | Discharge status: Home / Self Care | Payer: Medicare Other | Attending: Internal Medicine | Admitting: Internal Medicine

## 2019-03-26 ENCOUNTER — Encounter (HOSPITAL_COMMUNITY): Payer: Self-pay | Admitting: Emergency Medicine

## 2019-03-26 DIAGNOSIS — E871 Hypo-osmolality and hyponatremia: Secondary | ICD-10-CM | POA: Diagnosis present

## 2019-03-26 DIAGNOSIS — E8770 Fluid overload, unspecified: Secondary | ICD-10-CM | POA: Diagnosis present

## 2019-03-26 DIAGNOSIS — T8611 Kidney transplant rejection: Secondary | ICD-10-CM | POA: Diagnosis present

## 2019-03-26 DIAGNOSIS — I4892 Unspecified atrial flutter: Secondary | ICD-10-CM | POA: Diagnosis present

## 2019-03-26 DIAGNOSIS — R1013 Epigastric pain: Secondary | ICD-10-CM

## 2019-03-26 DIAGNOSIS — D631 Anemia in chronic kidney disease: Secondary | ICD-10-CM | POA: Diagnosis present

## 2019-03-26 DIAGNOSIS — N179 Acute kidney failure, unspecified: Secondary | ICD-10-CM | POA: Diagnosis present

## 2019-03-26 DIAGNOSIS — I482 Chronic atrial fibrillation, unspecified: Secondary | ICD-10-CM | POA: Diagnosis present

## 2019-03-26 DIAGNOSIS — R69 Illness, unspecified: Secondary | ICD-10-CM

## 2019-03-26 DIAGNOSIS — I129 Hypertensive chronic kidney disease with stage 1 through stage 4 chronic kidney disease, or unspecified chronic kidney disease: Secondary | ICD-10-CM | POA: Diagnosis not present

## 2019-03-26 DIAGNOSIS — K861 Other chronic pancreatitis: Secondary | ICD-10-CM | POA: Diagnosis present

## 2019-03-26 DIAGNOSIS — I120 Hypertensive chronic kidney disease with stage 5 chronic kidney disease or end stage renal disease: Secondary | ICD-10-CM | POA: Diagnosis present

## 2019-03-26 DIAGNOSIS — R188 Other ascites: Secondary | ICD-10-CM | POA: Diagnosis present

## 2019-03-26 DIAGNOSIS — Z20828 Contact with and (suspected) exposure to other viral communicable diseases: Secondary | ICD-10-CM | POA: Diagnosis present

## 2019-03-26 DIAGNOSIS — K529 Noninfective gastroenteritis and colitis, unspecified: Secondary | ICD-10-CM | POA: Diagnosis present

## 2019-03-26 DIAGNOSIS — R109 Unspecified abdominal pain: Secondary | ICD-10-CM | POA: Diagnosis not present

## 2019-03-26 DIAGNOSIS — Z7952 Long term (current) use of systemic steroids: Secondary | ICD-10-CM

## 2019-03-26 DIAGNOSIS — D649 Anemia, unspecified: Secondary | ICD-10-CM | POA: Diagnosis not present

## 2019-03-26 DIAGNOSIS — Z940 Kidney transplant status: Secondary | ICD-10-CM

## 2019-03-26 DIAGNOSIS — E8809 Other disorders of plasma-protein metabolism, not elsewhere classified: Secondary | ICD-10-CM | POA: Diagnosis present

## 2019-03-26 DIAGNOSIS — R197 Diarrhea, unspecified: Secondary | ICD-10-CM | POA: Diagnosis present

## 2019-03-26 DIAGNOSIS — Y830 Surgical operation with transplant of whole organ as the cause of abnormal reaction of the patient, or of later complication, without mention of misadventure at the time of the procedure: Secondary | ICD-10-CM | POA: Diagnosis present

## 2019-03-26 DIAGNOSIS — N1330 Unspecified hydronephrosis: Secondary | ICD-10-CM | POA: Diagnosis present

## 2019-03-26 DIAGNOSIS — N049 Nephrotic syndrome with unspecified morphologic changes: Secondary | ICD-10-CM

## 2019-03-26 DIAGNOSIS — R918 Other nonspecific abnormal finding of lung field: Secondary | ICD-10-CM | POA: Diagnosis not present

## 2019-03-26 DIAGNOSIS — I454 Nonspecific intraventricular block: Secondary | ICD-10-CM | POA: Diagnosis present

## 2019-03-26 DIAGNOSIS — N184 Chronic kidney disease, stage 4 (severe): Secondary | ICD-10-CM | POA: Diagnosis not present

## 2019-03-26 DIAGNOSIS — K8681 Exocrine pancreatic insufficiency: Secondary | ICD-10-CM | POA: Diagnosis present

## 2019-03-26 DIAGNOSIS — N186 End stage renal disease: Secondary | ICD-10-CM | POA: Diagnosis present

## 2019-03-26 DIAGNOSIS — J90 Pleural effusion, not elsewhere classified: Secondary | ICD-10-CM | POA: Diagnosis present

## 2019-03-26 DIAGNOSIS — Z7901 Long term (current) use of anticoagulants: Secondary | ICD-10-CM | POA: Diagnosis not present

## 2019-03-26 DIAGNOSIS — Z885 Allergy status to narcotic agent status: Secondary | ICD-10-CM

## 2019-03-26 DIAGNOSIS — E875 Hyperkalemia: Secondary | ICD-10-CM | POA: Diagnosis present

## 2019-03-26 DIAGNOSIS — Z79899 Other long term (current) drug therapy: Secondary | ICD-10-CM

## 2019-03-26 DIAGNOSIS — G40909 Epilepsy, unspecified, not intractable, without status epilepticus: Secondary | ICD-10-CM | POA: Diagnosis present

## 2019-03-26 DIAGNOSIS — Z888 Allergy status to other drugs, medicaments and biological substances status: Secondary | ICD-10-CM

## 2019-03-26 DIAGNOSIS — K8689 Other specified diseases of pancreas: Secondary | ICD-10-CM | POA: Diagnosis not present

## 2019-03-26 DIAGNOSIS — G8929 Other chronic pain: Secondary | ICD-10-CM | POA: Diagnosis not present

## 2019-03-26 LAB — COMPREHENSIVE METABOLIC PANEL
ALT: 13 U/L (ref 0–44)
AST: 17 U/L (ref 15–41)
Albumin: 3.8 g/dL (ref 3.5–5.0)
Alkaline Phosphatase: 43 U/L (ref 38–126)
Anion gap: 12 (ref 5–15)
BUN: 76 mg/dL — ABNORMAL HIGH (ref 6–20)
CO2: 17 mmol/L — ABNORMAL LOW (ref 22–32)
Calcium: 9 mg/dL (ref 8.9–10.3)
Chloride: 92 mmol/L — ABNORMAL LOW (ref 98–111)
Creatinine, Ser: 5.51 mg/dL — ABNORMAL HIGH (ref 0.61–1.24)
GFR calc Af Amer: 14 mL/min — ABNORMAL LOW (ref >60)
GFR calc non Af Amer: 12 mL/min — ABNORMAL LOW (ref >60)
Glucose, Bld: 110 mg/dL — ABNORMAL HIGH (ref 70–99)
Potassium: 5 mmol/L (ref 3.5–5.1)
Sodium: 121 mmol/L — ABNORMAL LOW (ref 135–145)
Total Bilirubin: 1.2 mg/dL (ref 0.3–1.2)
Total Protein: 6.2 g/dL — ABNORMAL LOW (ref 6.5–8.1)

## 2019-03-26 LAB — RENAL FUNCTION PANEL
Albumin: 3.4 g/dL — ABNORMAL LOW (ref 3.5–5.0)
Anion gap: 14 (ref 5–15)
BUN: 81 mg/dL — ABNORMAL HIGH (ref 6–20)
CO2: 16 mmol/L — ABNORMAL LOW (ref 22–32)
Calcium: 8.6 mg/dL — ABNORMAL LOW (ref 8.9–10.3)
Chloride: 93 mmol/L — ABNORMAL LOW (ref 98–111)
Creatinine, Ser: 5.73 mg/dL — ABNORMAL HIGH (ref 0.61–1.24)
GFR calc Af Amer: 14 mL/min — ABNORMAL LOW (ref >60)
GFR calc non Af Amer: 12 mL/min — ABNORMAL LOW (ref >60)
Glucose, Bld: 105 mg/dL — ABNORMAL HIGH (ref 70–99)
Phosphorus: 5.5 mg/dL — ABNORMAL HIGH (ref 2.5–4.6)
Potassium: 5.7 mmol/L — ABNORMAL HIGH (ref 3.5–5.1)
Sodium: 123 mmol/L — ABNORMAL LOW (ref 135–145)

## 2019-03-26 LAB — LIPASE, BLOOD: Lipase: 33 U/L (ref 11–51)

## 2019-03-26 LAB — CBC WITH DIFFERENTIAL/PLATELET
Abs Immature Granulocytes: 0.05 10*3/uL (ref 0.00–0.07)
Basophils Absolute: 0 10*3/uL (ref 0.0–0.1)
Basophils Relative: 0 %
Eosinophils Absolute: 0.1 10*3/uL (ref 0.0–0.5)
Eosinophils Relative: 1 %
HCT: 29 % — ABNORMAL LOW (ref 39.0–52.0)
Hemoglobin: 9.8 g/dL — ABNORMAL LOW (ref 13.0–17.0)
Immature Granulocytes: 1 %
Lymphocytes Relative: 1 %
Lymphs Abs: 0.1 10*3/uL — ABNORMAL LOW (ref 0.7–4.0)
MCH: 30.9 pg (ref 26.0–34.0)
MCHC: 33.8 g/dL (ref 30.0–36.0)
MCV: 91.5 fL (ref 80.0–100.0)
Monocytes Absolute: 0.3 10*3/uL (ref 0.1–1.0)
Monocytes Relative: 4 %
Neutro Abs: 7.1 10*3/uL (ref 1.7–7.7)
Neutrophils Relative %: 93 %
Platelets: 256 10*3/uL (ref 150–400)
RBC: 3.17 MIL/uL — ABNORMAL LOW (ref 4.22–5.81)
RDW: 12.5 % (ref 11.5–15.5)
WBC: 7.7 10*3/uL (ref 4.0–10.5)
nRBC: 0 % (ref 0.0–0.2)

## 2019-03-26 LAB — POCT I-STAT EG7
Acid-base deficit: 6 mmol/L — ABNORMAL HIGH (ref 0.0–2.0)
Bicarbonate: 19.3 mmol/L — ABNORMAL LOW (ref 20.0–28.0)
Calcium, Ion: 1.08 mmol/L — ABNORMAL LOW (ref 1.15–1.40)
HCT: 27 % — ABNORMAL LOW (ref 39.0–52.0)
Hemoglobin: 9.2 g/dL — ABNORMAL LOW (ref 13.0–17.0)
O2 Saturation: 100 %
Potassium: 5.1 mmol/L (ref 3.5–5.1)
Sodium: 120 mmol/L — ABNORMAL LOW (ref 135–145)
TCO2: 20 mmol/L — ABNORMAL LOW (ref 22–32)
pCO2, Ven: 34.9 mmHg — ABNORMAL LOW (ref 44.0–60.0)
pH, Ven: 7.35 (ref 7.250–7.430)
pO2, Ven: 187 mmHg — ABNORMAL HIGH (ref 32.0–45.0)

## 2019-03-26 LAB — C DIFFICILE QUICK SCREEN W PCR REFLEX??
C Diff antigen: NEGATIVE
C Diff interpretation: NOT DETECTED
C Diff toxin: NEGATIVE

## 2019-03-26 LAB — RAPID HIV SCREEN (HIV 1/2 AB+AG)
HIV 1/2 Antibodies: NONREACTIVE
HIV-1 P24 Antigen - HIV24: NONREACTIVE

## 2019-03-26 LAB — TYPE AND SCREEN
ABO/RH(D): A NEG
Antibody Screen: NEGATIVE

## 2019-03-26 LAB — VALPROIC ACID LEVEL: Valproic Acid Lvl: <10 ug/mL — ABNORMAL LOW (ref 50.0–100.0)

## 2019-03-26 LAB — BASIC METABOLIC PANEL
Anion gap: 12 (ref 5–15)
BUN: 79 mg/dL — ABNORMAL HIGH (ref 6–20)
CO2: 18 mmol/L — ABNORMAL LOW (ref 22–32)
Calcium: 8.6 mg/dL — ABNORMAL LOW (ref 8.9–10.3)
Chloride: 92 mmol/L — ABNORMAL LOW (ref 98–111)
Creatinine, Ser: 5.72 mg/dL — ABNORMAL HIGH (ref 0.61–1.24)
GFR calc Af Amer: 14 mL/min — ABNORMAL LOW (ref >60)
GFR calc non Af Amer: 12 mL/min — ABNORMAL LOW (ref >60)
Glucose, Bld: 132 mg/dL — ABNORMAL HIGH (ref 70–99)
Potassium: 5.5 mmol/L — ABNORMAL HIGH (ref 3.5–5.1)
Sodium: 122 mmol/L — ABNORMAL LOW (ref 135–145)

## 2019-03-26 LAB — LACTIC ACID, PLASMA: Lactic Acid, Venous: 0.6 mmol/L (ref 0.5–1.9)

## 2019-03-26 LAB — TROPONIN I (HIGH SENSITIVITY)
Troponin I (High Sensitivity): 10 ng/L (ref <18)
Troponin I (High Sensitivity): 9 ng/L (ref <18)

## 2019-03-26 LAB — ABO/RH: ABO/RH(D): A NEG

## 2019-03-26 LAB — PROTIME-INR
INR: 1.2 (ref 0.8–1.2)
Prothrombin Time: 14.6 seconds (ref 11.4–15.2)

## 2019-03-26 LAB — AMMONIA: Ammonia: 15 umol/L (ref 9–35)

## 2019-03-26 LAB — POC OCCULT BLOOD, ED: Fecal Occult Bld: NEGATIVE

## 2019-03-26 LAB — SARS CORONAVIRUS 2 BY RT PCR (HOSPITAL ORDER, PERFORMED IN ~~LOC~~ HOSPITAL LAB): SARS Coronavirus 2: NEGATIVE

## 2019-03-26 MED ORDER — PANTOPRAZOLE SODIUM 40 MG PO TBEC
40.0000 mg | DELAYED_RELEASE_TABLET | Freq: Two times a day (BID) | ORAL | Status: DC
  Administered 2019-03-27 – 2019-03-31 (×10): 40 mg via ORAL
  Filled 2019-03-26 (×10): qty 1

## 2019-03-26 MED ORDER — FUROSEMIDE 10 MG/ML IJ SOLN
80.0000 mg | Freq: Once | INTRAMUSCULAR | Status: AC
  Administered 2019-03-26: 80 mg via INTRAVENOUS
  Filled 2019-03-26: qty 8

## 2019-03-26 MED ORDER — DEXTROSE 50 % IV SOLN
1.0000 | Freq: Once | INTRAVENOUS | Status: DC
  Filled 2019-03-26: qty 50

## 2019-03-26 MED ORDER — HYDROMORPHONE HCL 1 MG/ML IJ SOLN: 0.5000 mg | INTRAMUSCULAR | Status: DC | PRN

## 2019-03-26 MED ORDER — AZATHIOPRINE 50 MG PO TABS
50.0000 mg | ORAL_TABLET | Freq: Every day | ORAL | Status: DC
  Administered 2019-03-27 – 2019-03-28 (×2): 50 mg via ORAL
  Filled 2019-03-26 (×3): qty 1

## 2019-03-26 MED ORDER — LEVETIRACETAM 500 MG PO TABS
1000.0000 mg | ORAL_TABLET | Freq: Two times a day (BID) | ORAL | Status: DC
  Administered 2019-03-27 – 2019-03-28 (×4): 1000 mg via ORAL
  Filled 2019-03-26 (×4): qty 2

## 2019-03-26 MED ORDER — FENTANYL CITRATE (PF) 100 MCG/2ML IJ SOLN
75.0000 ug | Freq: Once | INTRAMUSCULAR | Status: AC
  Administered 2019-03-26: 75 ug via INTRAVENOUS
  Filled 2019-03-26: qty 2

## 2019-03-26 MED ORDER — ALBUTEROL SULFATE (2.5 MG/3ML) 0.083% IN NEBU
10.0000 mg | INHALATION_SOLUTION | Freq: Once | RESPIRATORY_TRACT | Status: AC
  Administered 2019-03-26: 10 mg via RESPIRATORY_TRACT
  Filled 2019-03-26: qty 12

## 2019-03-26 MED ORDER — PANCRELIPASE (LIP-PROT-AMYL) 12000-38000 UNITS PO CPEP
48000.0000 [IU] | ORAL_CAPSULE | Freq: Three times a day (TID) | ORAL | Status: DC
  Administered 2019-03-29 – 2019-03-31 (×7): 48000 [IU] via ORAL
  Filled 2019-03-26 (×10): qty 4

## 2019-03-26 MED ORDER — PROMETHAZINE HCL 25 MG/ML IJ SOLN
25.0000 mg | Freq: Once | INTRAMUSCULAR | Status: AC
  Administered 2019-03-26: 25 mg via INTRAVENOUS
  Filled 2019-03-26: qty 1

## 2019-03-26 MED ORDER — SODIUM CHLORIDE 0.9 % IV SOLN
1000.0000 mL | INTRAVENOUS | Status: DC
  Administered 2019-03-26: 1000 mL via INTRAVENOUS

## 2019-03-26 MED ORDER — CYCLOSPORINE MODIFIED (NEORAL) 25 MG PO CAPS
75.0000 mg | ORAL_CAPSULE | Freq: Two times a day (BID) | ORAL | Status: DC
  Administered 2019-03-27 – 2019-03-31 (×10): 75 mg via ORAL
  Filled 2019-03-26 (×11): qty 3

## 2019-03-26 MED ORDER — LEFLUNOMIDE 20 MG PO TABS
20.0000 mg | ORAL_TABLET | Freq: Every day | ORAL | Status: DC
  Administered 2019-03-27 – 2019-03-28 (×2): 20 mg via ORAL
  Filled 2019-03-26 (×3): qty 1

## 2019-03-26 MED ORDER — SODIUM ZIRCONIUM CYCLOSILICATE 5 G PO PACK
5.0000 g | PACK | Freq: Once | ORAL | Status: AC
  Administered 2019-03-27: 5 g via ORAL
  Filled 2019-03-26: qty 1

## 2019-03-26 MED ORDER — ONDANSETRON HCL 4 MG/2ML IJ SOLN
4.0000 mg | Freq: Three times a day (TID) | INTRAMUSCULAR | Status: DC | PRN
  Administered 2019-03-27 – 2019-03-28 (×3): 4 mg via INTRAVENOUS
  Filled 2019-03-26 (×3): qty 2

## 2019-03-26 MED ORDER — SODIUM ZIRCONIUM CYCLOSILICATE 5 G PO PACK
5.0000 g | PACK | Freq: Once | ORAL | Status: AC
  Administered 2019-03-26: 5 g via ORAL
  Filled 2019-03-26: qty 1

## 2019-03-26 MED ORDER — CALCIUM GLUCONATE-NACL 1-0.675 GM/50ML-% IV SOLN
1.0000 g | Freq: Once | INTRAVENOUS | Status: AC
  Administered 2019-03-27: 1000 mg via INTRAVENOUS
  Filled 2019-03-26: qty 50

## 2019-03-26 MED ORDER — PREDNISONE 20 MG PO TABS
10.0000 mg | ORAL_TABLET | Freq: Every day | ORAL | Status: DC
  Administered 2019-03-27 – 2019-03-28 (×2): 10 mg via ORAL
  Filled 2019-03-26 (×2): qty 1

## 2019-03-26 MED ORDER — CYCLOSPORINE MODIFIED (NEORAL) 25 MG PO CAPS: 75.0000 mg | ORAL_CAPSULE | Freq: Two times a day (BID) | ORAL | Status: DC

## 2019-03-26 MED ORDER — CLONIDINE HCL 0.2 MG PO TABS
0.3000 mg | ORAL_TABLET | Freq: Three times a day (TID) | ORAL | Status: DC
  Administered 2019-03-27 – 2019-03-31 (×15): 0.3 mg via ORAL
  Filled 2019-03-26 (×15): qty 1

## 2019-03-26 MED ORDER — SODIUM CHLORIDE 0.9 % IV BOLUS
250.0000 mL | Freq: Once | INTRAVENOUS | Status: AC
  Administered 2019-03-26: 250 mL via INTRAVENOUS

## 2019-03-26 MED ORDER — SODIUM CHLORIDE 0.9 % IV BOLUS (SEPSIS)
250.0000 mL | Freq: Once | INTRAVENOUS | Status: AC
  Administered 2019-03-26: 250 mL via INTRAVENOUS

## 2019-03-26 MED ORDER — DIVALPROEX SODIUM 250 MG PO DR TAB
500.0000 mg | DELAYED_RELEASE_TABLET | Freq: Two times a day (BID) | ORAL | Status: DC
  Administered 2019-03-27 – 2019-03-31 (×10): 500 mg via ORAL
  Filled 2019-03-26 (×12): qty 2

## 2019-03-26 MED ORDER — SODIUM BICARBONATE 650 MG PO TABS
1950.0000 mg | ORAL_TABLET | Freq: Three times a day (TID) | ORAL | Status: DC
  Administered 2019-03-27 – 2019-03-31 (×15): 1950 mg via ORAL
  Filled 2019-03-26 (×15): qty 3

## 2019-03-26 MED ORDER — DICYCLOMINE HCL 10 MG PO CAPS
10.0000 mg | ORAL_CAPSULE | Freq: Three times a day (TID) | ORAL | Status: DC
  Administered 2019-03-27 – 2019-03-31 (×19): 10 mg via ORAL
  Filled 2019-03-26 (×21): qty 1

## 2019-03-26 MED ORDER — APIXABAN 2.5 MG PO TABS
2.5000 mg | ORAL_TABLET | Freq: Two times a day (BID) | ORAL | Status: DC
  Administered 2019-03-27 – 2019-03-31 (×9): 2.5 mg via ORAL
  Filled 2019-03-26 (×9): qty 1

## 2019-03-26 MED ORDER — LACOSAMIDE 50 MG PO TABS
250.0000 mg | ORAL_TABLET | Freq: Two times a day (BID) | ORAL | Status: DC
  Administered 2019-03-27 – 2019-03-28 (×4): 250 mg via ORAL
  Filled 2019-03-26 (×4): qty 5

## 2019-03-26 MED ORDER — LAMOTRIGINE 25 MG PO TABS
50.0000 mg | ORAL_TABLET | Freq: Every day | ORAL | Status: DC
  Administered 2019-03-27 – 2019-03-28 (×2): 50 mg via ORAL
  Filled 2019-03-26 (×2): qty 2

## 2019-03-26 MED ORDER — INSULIN ASPART 100 UNIT/ML IV SOLN: 10.0000 [IU] | Freq: Once | INTRAVENOUS | Status: DC

## 2019-03-26 MED ORDER — SODIUM CHLORIDE 0.9% FLUSH
3.0000 mL | Freq: Two times a day (BID) | INTRAVENOUS | Status: DC
  Administered 2019-03-27 – 2019-03-31 (×9): 3 mL via INTRAVENOUS

## 2019-03-26 MED ORDER — ONDANSETRON HCL 4 MG/2ML IJ SOLN
4.0000 mg | Freq: Once | INTRAMUSCULAR | Status: AC
  Administered 2019-03-26: 12:00:00 4 mg via INTRAVENOUS
  Filled 2019-03-26: qty 2

## 2019-03-26 MED ORDER — HYDROMORPHONE HCL 1 MG/ML IJ SOLN
0.5000 mg | INTRAMUSCULAR | Status: DC | PRN
  Administered 2019-03-27: 0.5 mg via INTRAVENOUS
  Filled 2019-03-26 (×2): qty 1

## 2019-03-26 NOTE — ED Notes (Signed)
ED TO INPATIENT HANDOFF REPORT  ED Nurse Name and Phone #: [phone]  S Name/Age/Gender John Cline. 33 y.o. male Room/Bed: 029C/029C  Code Status   Code Status: Full Code  Home/SNF/Other Home Patient oriented to: self, place, time and situation Is this baseline? Yes   Triage Complete: Triage complete  Chief Complaint syncope,gi bleed  Triage Note Pt arrives to ED from home with complaints of rectal bleeding, LUQ abdominal pain, vomiting, and a syncopal episode starting today after he woke up this morning. Pt states that he has been feeling bad for about a month and he has not been following up at [facility] with his Nephrologist. Pt very lethargic on arrival with blood stained pants, multiple episodes of diarrhea, emesis, and cold sweats. EMS reports pt is in Afib and lives alone.     Allergies Allergies  Allergen Reactions  . Morphine And Related Rash    Level of Care/Admitting Diagnosis ED Disposition    ED Disposition Condition [facility]: [place] [[id number]]  Level of Care: Progressive [102]  Covid Evaluation: Confirmed COVID Negative  Diagnosis: Hyponatremia [[id number]]  Admitting Physician: Aline Brochure  Attending Physician: [name] [phone]  Estimated length of stay: past midnight tomorrow  Certification:: I certify this patient will need inpatient services for at least 2 midnights  PT Class (Do Not Modify): Inpatient [101]  PT Acc Code (Do Not Modify): Private [1]       B Medical/Surgery History Past Medical History:  Diagnosis Date  . Atrial fibrillation ([place])   . Hypertension   . Seizures ([place])    Past Surgical History:  Procedure Laterality Date  . KIDNEY TRANSPLANT       A IV Location/Drains/Wounds Patient Lines/Drains/Airways Status   Active Line/Drains/Airways    Name:   Placement date:   Placement time:   Site:   Days:   Peripheral IV 03/26/19 Right Antecubital   03/26/19     1139    Antecubital   less than 1   Peripheral IV 03/26/19 Left Forearm   03/26/19    1638    Forearm   less than 1   External Urinary Catheter   03/26/19    1353    -   less than 1          Intake/Output Last 24 hours  Intake/Output Summary (Last 24 hours) at 03/26/2019 1857 Last data filed at 03/26/2019 1556 Gross per 24 hour  Intake 750 ml  Output -  Net 750 ml    Labs/Imaging Results for orders placed or performed during the hospital encounter of 03/26/19 (from the past 48 hour(s))  Comprehensive metabolic panel     Status: Abnormal   Collection Time: 03/26/19 11:30 AM  Result Value Ref Range   Sodium 121 (L) 135 - 145 mmol/L   Potassium 5.0 3.5 - 5.1 mmol/L   Chloride 92 (L) 98 - 111 mmol/L   CO2 17 (L) 22 - 32 mmol/L   Glucose, Bld 110 (H) 70 - 99 mg/dL   BUN 76 (H) 6 - 20 mg/dL   Creatinine, Ser 5.51 (H) 0.61 - 1.24 mg/dL   Calcium 9.0 8.9 - 10.3 mg/dL   Total Protein 6.2 (L) 6.5 - 8.1 g/dL   Albumin 3.8 3.5 - 5.0 g/dL   AST 17 15 - 41 U/L   ALT 13 0 - 44 U/L   Alkaline Phosphatase 43 38 - 126 U/L  Total Bilirubin 1.2 0.3 - 1.2 mg/dL   GFR calc non Af Amer 12 (L) >60 mL/min   GFR calc Af Amer 14 (L) >60 mL/min   Anion gap 12 5 - 15    Comment: Performed at [facility] [address] [place], [place] [address]  CBC WITH DIFFERENTIAL     Status: Abnormal   Collection Time: 03/26/19 11:30 AM  Result Value Ref Range   WBC 7.7 4.0 - 10.5 K/uL   RBC 3.17 (L) 4.22 - 5.81 MIL/uL   Hemoglobin 9.8 (L) 13.0 - 17.0 g/dL   HCT 29.0 (L) 39.0 - 52.0 %   MCV 91.5 80.0 - 100.0 fL   MCH 30.9 26.0 - 34.0 pg   MCHC 33.8 30.0 - 36.0 g/dL   RDW 12.5 11.5 - 15.5 %   Platelets 256 150 - 400 K/uL   nRBC 0.0 0.0 - 0.2 %   Neutrophils Relative % 93 %   Neutro Abs 7.1 1.7 - 7.7 K/uL   Lymphocytes Relative 1 %   Lymphs Abs 0.1 (L) 0.7 - 4.0 K/uL   Monocytes Relative 4 %   Monocytes Absolute 0.3 0.1 - 1.0 K/uL   Eosinophils Relative 1 %   Eosinophils Absolute 0.1 0.0 -  0.5 K/uL   Basophils Relative 0 %   Basophils Absolute 0.0 0.0 - 0.1 K/uL   Immature Granulocytes 1 %   Abs Immature Granulocytes 0.05 0.00 - 0.07 K/uL    Comment: Performed at [facility], [address], [place], [place] [address]  Protime-INR     Status: None   Collection Time: 03/26/19 11:30 AM  Result Value Ref Range   Prothrombin Time 14.6 11.4 - 15.2 seconds   INR 1.2 0.8 - 1.2    Comment: (NOTE) INR goal varies based on device and disease states. Performed at [facility], [place] [address] [place], [place] [address]   Type and screen [place]     Status: None   Collection Time: 03/26/19 11:30 AM  Result Value Ref Range   ABO/RH(D) A NEG    Antibody Screen NEG    Sample Expiration      03/29/2019,2359 Performed at [facility], [place] [address] [place], [place] [address]   Lactic acid, plasma     Status: None   Collection Time: 03/26/19 11:30 AM  Result Value Ref Range   Lactic Acid, Venous 0.6 0.5 - 1.9 mmol/L    Comment: Performed at [facility] [address] [place], [place] [address]  Ammonia     Status: None   Collection Time: 03/26/19 11:30 AM  Result Value Ref Range   Ammonia 15 9 - 35 umol/L    Comment: Performed at [facility], [place] [address], [place], [place] [address]  Lipase, blood     Status: None   Collection Time: 03/26/19 11:30 AM  Result Value Ref Range   Lipase 33 11 - 51 U/L    Comment: Performed at [facility] [address] [place], [place] [address]  Troponin I (High Sensitivity)     Status: None   Collection Time: 03/26/19 11:30 AM  Result Value Ref Range   Troponin I (High Sensitivity) 10 <18 ng/L    Comment: (NOTE) Elevated high sensitivity troponin I (hsTnI) values and significant  changes across serial measurements may suggest ACS but many other  chronic and acute conditions are known to  elevate hsTnI results.  Refer to the "Links" section for chest pain algorithms and  additional  guidance. Performed at [facility], [place] [address], [place], [place] [address]   ABO/Rh     Status: None   Collection Time: 03/26/19 11:30 AM  Result Value Ref Range   ABO/RH(D)      A NEG Performed at [facility] [address], [place], [place] [address]   C difficile quick scan w PCR reflex     Status: None   Collection Time: 03/26/19 11:54 AM   Specimen: STOOL  Result Value Ref Range   C Diff antigen NEGATIVE NEGATIVE   C Diff toxin NEGATIVE NEGATIVE   C Diff interpretation No C. difficile detected.     Comment: Performed at [facility], [place] [address], [place], [place] [address]  SARS Coronavirus 2 (CEPHEID- Performed in [facility] hospital lab), Hosp Order     Status: None   Collection Time: 03/26/19 11:54 AM   Specimen: Nasopharyngeal Swab  Result Value Ref Range   SARS Coronavirus 2 NEGATIVE NEGATIVE    Comment: (NOTE) If result is NEGATIVE SARS-CoV-2 target nucleic acids are NOT DETECTED. The SARS-CoV-2 RNA is generally detectable in upper and lower  respiratory specimens during the acute phase of infection. The lowest  concentration of SARS-CoV-2 viral copies this assay can detect is 250  copies / mL. A negative result does not preclude SARS-CoV-2 infection  and should not be used as the sole basis for treatment or other  patient management decisions.  A negative result may occur with  improper specimen collection / handling, submission of specimen other  than nasopharyngeal swab, presence of viral mutation(s) within the  areas targeted by this assay, and inadequate number of viral copies  (<250 copies / mL). A negative result must be combined with clinical  observations, patient history, and epidemiological information. If result is POSITIVE SARS-CoV-2 target nucleic acids are DETECTED. The SARS-CoV-2 RNA is generally detectable in upper and lower  respiratory specimens dur ing the acute phase of infection.  Positive  results  are indicative of active infection with SARS-CoV-2.  Clinical  correlation with patient history and other diagnostic information is  necessary to determine patient infection status.  Positive results do  not rule out bacterial infection or co-infection with other viruses. If result is PRESUMPTIVE POSTIVE SARS-CoV-2 nucleic acids MAY BE PRESENT.   A presumptive positive result was obtained on the submitted specimen  and confirmed on repeat testing.  While 2019 novel coronavirus  (SARS-CoV-2) nucleic acids may be present in the submitted sample  additional confirmatory testing may be necessary for epidemiological  and / or clinical management purposes  to differentiate between  SARS-CoV-2 and other Sarbecovirus currently known to infect humans.  If clinically indicated additional testing with an alternate test  methodology [phone]) is advised. The SARS-CoV-2 RNA is generally  detectable in upper and lower respiratory sp ecimens during the acute  phase of infection. The expected result is Negative. Fact Sheet for Patients:  StrictlyIdeas.no Fact Sheet for Healthcare Providers: BankingDealers.co.za This test is not yet approved or cleared by the [place] FDA and has been authorized for detection and/or diagnosis of SARS-CoV-2 by FDA under an Emergency Use Authorization (EUA).  This EUA will remain in effect (meaning this test can be used) for the duration of the COVID-19 declaration under Section 564(b)(1) of the Act, 21 U.S.C. section 360bbb-3(b)(1), unless the authorization is terminated or revoked  sooner. Performed at [facility], [place] [address], [place], [place] [address]   POCT I-Stat EG7     Status: Abnormal   Collection Time: 03/26/19 12:08 PM  Result Value Ref Range   pH, Ven 7.350 7.250 - 7.430   pCO2, Ven 34.9 (L) 44.0 - 60.0 mmHg   pO2, Ven 187.0 (H) 32.0 - 45.0 mmHg   Bicarbonate 19.3 (L) 20.0 - 28.0 mmol/L    TCO2 20 (L) 22 - 32 mmol/L   O2 Saturation 100.0 %   Acid-base deficit 6.0 (H) 0.0 - 2.0 mmol/L   Sodium 120 (L) 135 - 145 mmol/L   Potassium 5.1 3.5 - 5.1 mmol/L   Calcium, Ion 1.08 (L) 1.15 - 1.40 mmol/L   HCT 27.0 (L) 39.0 - 52.0 %   Hemoglobin 9.2 (L) 13.0 - 17.0 g/dL   Patient temperature HIDE    Sample type VENOUS   POC occult blood, ED Provider will collect     Status: None   Collection Time: 03/26/19 12:09 PM  Result Value Ref Range   Fecal Occult Bld NEGATIVE NEGATIVE  Troponin I (High Sensitivity)     Status: None   Collection Time: 03/26/19  1:48 PM  Result Value Ref Range   Troponin I (High Sensitivity) 9 <18 ng/L    Comment: (NOTE) Elevated high sensitivity troponin I (hsTnI) values and significant  changes across serial measurements may suggest ACS but many other  chronic and acute conditions are known to elevate hsTnI results.  Refer to the "Links" section for chest pain algorithms and additional  guidance. Performed at [facility], [place] [address] [place], [place] [address]   Rapid HIV screen (HIV 1/2 Ab+Ag)     Status: None   Collection Time: 03/26/19  1:48 PM  Result Value Ref Range   HIV-1 P24 Antigen - HIV24 NON REACTIVE NON REACTIVE   HIV 1/2 Antibodies NON REACTIVE NON REACTIVE   Interpretation (HIV Ag Ab)      A non reactive test result means that HIV 1 or HIV 2 antibodies and HIV 1 p24 antigen were not detected in the specimen.    Comment: NON REACTIVE Performed at [facility], [place] [address], [place], [place] [address]   Valproic acid level     Status: Abnormal   Collection Time: 03/26/19  1:48 PM  Result Value Ref Range   Valproic Acid Lvl <10 (L) 50.0 - 100.0 ug/mL    Comment: RESULTS CONFIRMED BY MANUAL DILUTION Performed at [facility] [address], [place], [place] [address]   Basic metabolic panel     Status: Abnormal   Collection Time: 03/26/19  5:12 PM  Result Value Ref Range   Sodium 122 (L) 135 - 145 mmol/L    Potassium 5.5 (H) 3.5 - 5.1 mmol/L   Chloride 92 (L) 98 - 111 mmol/L   CO2 18 (L) 22 - 32 mmol/L   Glucose, Bld 132 (H) 70 - 99 mg/dL   BUN 79 (H) 6 - 20 mg/dL   Creatinine, Ser 5.72 (H) 0.61 - 1.24 mg/dL   Calcium 8.6 (L) 8.9 - 10.3 mg/dL   GFR calc non Af Amer 12 (L) >60 mL/min   GFR calc Af Amer 14 (L) >60 mL/min   Anion gap 12 5 - 15    Comment: Performed at [facility] [address], [place], [place] [address]   Ct Abdomen Pelvis Wo Contrast  Result Date: 03/26/2019  CLINICAL DATA:  Left-sided abdominal pain and vomiting. Rectal bleeding. Diarrhea. Kidney transplant patient. EXAM: CT ABDOMEN AND PELVIS WITHOUT CONTRAST TECHNIQUE: Multidetector CT imaging of the abdomen and pelvis was performed following the standard protocol without IV contrast. COMPARISON:  None. FINDINGS: Lower chest: Small bilateral pleural effusions. 7 mm subpleural nodular opacity in the posterior right lower lobe on image 20/series 5. Hepatobiliary: No mass visualized on this unenhanced exam. Gallbladder is unremarkable. Pancreas: No mass or inflammatory process visualized on this unenhanced exam. Spleen:  Within normal limits in size. Adrenals/Urinary tract: Previous bilateral nephrectomies. Normal appearance of renal transplant in left iliac fossa on this unenhanced exam. No evidence of hydronephrosis. Postop changes noted in the right iliac fossa, likely from previous renal transplant in this location. Unremarkable unopacified urinary bladder. Stomach/Bowel: No evidence of obstruction, inflammatory process, or abnormal fluid collections. Vascular/Lymphatic: No pathologically enlarged lymph nodes identified. No evidence of abdominal aortic aneurysm. Reproductive:  No mass or other significant abnormality. Other:  Diffuse mesenteric and body wall edema. Mild ascites. Musculoskeletal:  No suspicious bone lesions identified. IMPRESSION: 1. Small bilateral pleural effusions, mild ascites, and diffuse mesenteric and  body wall edema, consistent with third spacing. 2. No other acute findings identified. 3. Normal appearance of renal transplant in left iliac fossa. No evidence of hydronephrosis. Electronically Signed   By: Marlaine Hind M.D.   On: 03/26/2019 15:03   Dg Chest Port 1 View  Result Date: 03/26/2019 CLINICAL DATA:  Vomiting and shortness of breath. EXAM: PORTABLE CHEST 1 VIEW COMPARISON:  July 11, 2013 FINDINGS: Cardiomediastinal silhouette is normal. Mediastinal contours appear intact. Diffusely increased interstitial markings. Multiple less than a cm nodular opacities are seen throughout both lungs with central predominance. Osseous structures are without acute abnormality. Soft tissues are grossly normal. IMPRESSION: Diffusely increased interstitial markings with multiple less than a cm nodular opacities throughout both lungs. These findings may represent mixed pattern pulmonary edema or atypical pneumonia. Electronically Signed   By: Fidela Salisbury M.D.   On: 03/26/2019 12:26    Pending Labs Unresulted Labs (From admission, onward)    Start     Ordered   03/27/19 0500  CBC  Tomorrow morning,   R     03/26/19 1834   03/26/19 2000  Renal function panel  Now then every 4 hours,   R (with STAT occurrences)     03/26/19 1834   03/26/19 1324  Levetiracetam level  Once,   STAT     03/26/19 1324   03/26/19 1320  Hepatitis panel, acute  ONCE - STAT,   STAT     03/26/19 1319   03/26/19 1136  Gastrointestinal Panel by PCR , Stool  (Gastrointestinal Panel by PCR, Stool)  Once,   STAT     03/26/19 1136   03/26/19 1134  Urine culture  ONCE - STAT,   STAT     03/26/19 1133          Vitals/Pain Today's Vitals   03/26/19 1345 03/26/19 1500 03/26/19 1515 03/26/19 1530  BP:  (!) 145/85    Pulse: 61 62 62 (!) 54  Resp: 12 20 15 10   Temp:      TempSrc:      SpO2: 95% 99% 100% 99%  Weight:      Height:      PainSc:        Isolation Precautions No active  isolations  Medications Medications  leflunomide (ARAVA) tablet 20 mg (has no administration in time range)  predniSONE (DELTASONE) tablet 10 mg (has no administration in time range)  Pancrelipase (Lip-Prot-Amyl) 24000-76000 units CPEP 48,000 Units (has no administration in time range)  dicyclomine (BENTYL) capsule 10 mg (has no administration in time range)  sodium bicarbonate tablet 1,950 mg (has no administration in time range)  apixaban (ELIQUIS) tablet 2.5 mg (has no administration in time range)  azaTHIOprine (IMURAN) tablet 50 mg (has no administration in time range)  cycloSPORINE modified (NEORAL) capsule 75 mg (has no administration in time range)  levETIRAcetam (KEPPRA) tablet 1,000 mg (has no administration in time range)  Lacosamide TABS 250 mg (has no administration in time range)  divalproex (DEPAKOTE) DR tablet 500 mg (has no administration in time range)  lamoTRIgine (LAMICTAL) tablet 50 mg (has no administration in time range)  pantoprazole (PROTONIX) EC tablet 40 mg (has no administration in time range)  sodium chloride flush (NS) 0.9 % injection 3 mL (has no administration in time range)  furosemide (LASIX) injection 80 mg (has no administration in time range)  insulin aspart (novoLOG) injection 10 Units (has no administration in time range)    And  dextrose 50 % solution 50 mL (has no administration in time range)  sodium zirconium cyclosilicate (LOKELMA) packet 5 g (has no administration in time range)  calcium gluconate 1 g/ 50 mL sodium chloride IVPB (has no administration in time range)  albuterol (PROVENTIL) (2.5 MG/3ML) 0.083% nebulizer solution 10 mg (has no administration in time range)  cloNIDine (CATAPRES) tablet 0.3 mg (has no administration in time range)  fentaNYL (SUBLIMAZE) injection 75 mcg (75 mcg Intravenous Given 03/26/19 1147)  ondansetron (ZOFRAN) injection 4 mg (4 mg Intravenous Given 03/26/19 1145)  sodium chloride 0.9 % bolus 250 mL (0 mLs  Intravenous Stopped 03/26/19 1230)  promethazine (PHENERGAN) injection 25 mg (25 mg Intravenous Given 03/26/19 1301)  sodium chloride 0.9 % bolus 250 mL (0 mLs Intravenous Stopped 03/26/19 1330)  sodium chloride 0.9 % bolus 250 mL (0 mLs Intravenous Stopped 03/26/19 1556)    Mobility walks High fall risk   Focused Assessments  Renal Assessment Handoff:  Renal Transplant       R Recommendations: See Admitting Provider Note  Report given to:   Additional Notes: Worsening kidney function. Hyperkalemia and hyponatremia.

## 2019-03-26 NOTE — ED Notes (Signed)
Admitting MDs at bedside.

## 2019-03-26 NOTE — ED Notes (Signed)
Called lab about cmp, lipase and trop.. Stated they are running them

## 2019-03-26 NOTE — ED Notes (Signed)
Talked with patients mother Caren Griffins to give update on the patient

## 2019-03-26 NOTE — ED Triage Notes (Addendum)
Pt arrives to ED from home with complaints of rectal bleeding, LUQ abdominal pain, vomiting, and a syncopal episode starting today after he woke up this morning. Pt states that he has been feeling bad for about a month and he has not been following up at [facility] with his Nephrologist. Pt very lethargic on arrival with blood stained pants, multiple episodes of diarrhea, emesis, and cold sweats. EMS reports pt is in Afib and lives alone.

## 2019-03-26 NOTE — ED Provider Notes (Signed)
[facility] EMERGENCY DEPARTMENT Provider Note   CSN: [id number] Arrival date & time: 03/26/19  1057    History   Chief Complaint Chief Complaint  Patient presents with  . Rectal Bleeding  . Abdominal Pain  . Emesis    HPI John Hagan. is a 33 y.o. male.     HPI Patient is a somewhat difficult historian.  He is ill in appearance.  He first advised me he has been sick for 3 to 4 weeks but has not sought any medical care in that period of time.  When I asked for more clarification he then advised he is been sick for several days and not several weeks.  He reports he has had recurrent vomiting and diarrhea too numerous to count.  He reports his abdomen hurts.  He generally indicates the central and upper left abdomen.  He does not know if he has had any fever.  He denies any COVID exposure.  He denies any recent visits with his transplant team.  He seemed to indicate he is taking his medications as prescribed despite his description of severe vomiting and diarrhea. Past Medical History:  Diagnosis Date  . Atrial fibrillation ([place])   . Hypertension   . Seizures ([place])     There are no active problems to display for this patient.   Past Surgical History:  Procedure Laterality Date  . KIDNEY TRANSPLANT          Home Medications    Prior to Admission medications   Medication Sig Start Date End Date Taking? Authorizing Provider  amLODipine (NORVASC) 5 MG tablet Take 5 mg by mouth daily. 04/30/18   [provider]  azaTHIOprine (IMURAN) 50 MG tablet Take 50 mg by mouth daily. 04/30/18   [provider]  carvedilol (COREG) 12.5 MG tablet Take 25 mg by mouth 2 (two) times daily. 05/26/18   [provider]  CREON 24000-76000 units CPEP Take 2 capsules by mouth 3 (three) times daily with meals. 05/11/18   [provider]  cycloSPORINE modified (NEORAL) 100 MG capsule Take 100 mg by mouth daily. TAKE 1 CAPSULE WITH TWO 25MG   CAP TWICE DAILY FOR TOTAL DOSE OF 150MG  TWICE DAILY 05/03/18   [provider]  cycloSPORINE modified (NEORAL) 25 MG capsule Take 50 mg by mouth daily. TAKE 2 CAPSULES BY MOUTH WITH ONE 100MG  CAP TWICE DAILY FOR TOTAL DOSE 150MG  TWICE DAILY 05/13/18   [provider]  dicyclomine (BENTYL) 10 MG capsule Take 10 mg by mouth 4 (four) times daily -  before meals and at bedtime. 04/30/18   [provider]  diltiazem (CARDIZEM CD) 120 MG 24 hr capsule Take 120 mg by mouth daily. 05/13/18   [provider]  divalproex (DEPAKOTE) 250 MG DR tablet Take 500 mg by mouth 2 (two) times daily. 05/10/18   [provider]  ELIQUIS 5 MG TABS tablet Take 5 mg by mouth 2 (two) times daily. 05/25/18   [provider]  furosemide (LASIX) 40 MG tablet Take 40 mg by mouth daily. 05/30/18   [provider]  gabapentin (NEURONTIN) 300 MG capsule Take 300 mg by mouth 2 (two) times daily. 05/03/18   [provider]  leflunomide (ARAVA) 20 MG tablet Take 20 mg by mouth daily. 05/26/18   [provider]  levETIRAcetam (KEPPRA) 500 MG tablet Take 1,000 mg by mouth 2 (two) times daily. 05/23/18   [provider]  mirtazapine (REMERON) 15 MG  tablet Take 7.5 mg by mouth at bedtime. 05/26/18   [provider]  Pancrelipase, Lip-Prot-Amyl, 24000-76000 units CPEP Take 1 capsule by mouth with snacks.    [provider]  predniSONE (DELTASONE) 10 MG tablet Take 10 mg by mouth daily. 05/19/18   [provider]  sodium bicarbonate 650 MG tablet Take 1,950 mg by mouth 3 (three) times daily. 05/27/18   [provider]  spironolactone (ALDACTONE) 25 MG tablet Take 25 mg by mouth daily. 03/31/18   [provider]  tamsulosin (FLOMAX) 0.4 MG CAPS capsule Take 0.4 mg by mouth daily. 04/30/18   [provider]  valGANciclovir (VALCYTE) 450 MG tablet Take 450 mg by mouth daily. 04/30/18   [provider]  VIMPAT  100 MG TABS Take 250 mg by mouth 2 (two) times daily. 05/03/18   [provider]  Vitamin D, Ergocalciferol, (DRISDOL) 50000 units CAPS capsule Take 1 capsule by mouth once a week. 05/12/18   [provider]    Family History History reviewed. No pertinent family history.  Social History Social History   Tobacco Use  . Smoking status: Never Smoker  Substance Use Topics  . Alcohol use: Not on file  . Drug use: Not on file     Allergies   Morphine and related   Review of Systems Review of Systems Level 5 caveat patient is very ill in appearance and I feel unreliable for complete review of systems.  Physical Exam Updated Vital Signs BP 112/68   Pulse (!) 56   Temp (!) 97.5 F (36.4 C) (Oral)   Resp 14   Ht 5\' 7"  (1.702 m)   Wt 79.4 kg   SpO2 100%   BMI 27.41 kg/m   Physical Exam Constitutional:      Comments: Patient has ill and uncomfortable appearance.  He does not have respiratory distress.  He seems somnolent but he does answer questions and make statements that are situationally focused.  HENT:     Head: Normocephalic and atraumatic.     Mouth/Throat:     Mouth: Mucous membranes are moist.     Pharynx: Oropharynx is clear.  Eyes:     Extraocular Movements: Extraocular movements intact.     Conjunctiva/sclera: Conjunctivae normal.     Pupils: Pupils are equal, round, and reactive to light.     Comments: Eyes are generally a little puffy and have periorbital edema mild to moderate.  Neck:     Musculoskeletal: Neck supple.  Cardiovascular:     Comments: Heart is irregularly irregular.  Rates ranged from 61s to 70s. Pulmonary:     Comments: No respiratory distress.  Breath sounds are very soft throughout.  Occasional crackle. Abdominal:     Comments: Abdomen is soft.  Mildly distended.  He endorses tenderness in the particularly epigastrium left upper quadrant but diffusely as well.  Musculoskeletal:     Comments: Patient has generalized  edema consistent with anasarca.  Approximately 2+.  Face is puffy.  Skin:    General: Skin is warm and dry.     Coloration: Skin is pale.  Neurological:     Comments: Patient seems very uncomfortable and slightly confused but is adequately oriented to reiterate that he needs medication for pain and nausea.  He was clear that he feels very bad and is sick.  He gives history that is somewhat disjointed but generally centers on GI symptoms.  He can move all of his extremities but he is very generally  weak.  No focal deficits.  Psychiatric:     Comments: Depressed and ill in appearance       ED Treatments / Results  Labs (all labs ordered are listed, but only abnormal results are displayed) Labs Reviewed  COMPREHENSIVE METABOLIC PANEL - Abnormal; Notable for the following components:      Result Value   Sodium 121 (*)    Chloride 92 (*)    CO2 17 (*)    Glucose, Bld 110 (*)    BUN 76 (*)    Creatinine, Ser 5.51 (*)    Total Protein 6.2 (*)    GFR calc non Af Amer 12 (*)    GFR calc Af Amer 14 (*)    All other components within normal limits  CBC WITH DIFFERENTIAL/PLATELET - Abnormal; Notable for the following components:   RBC 3.17 (*)    Hemoglobin 9.8 (*)    HCT 29.0 (*)    Lymphs Abs 0.1 (*)    All other components within normal limits  POCT I-STAT EG7 - Abnormal; Notable for the following components:   pCO2, Ven 34.9 (*)    pO2, Ven 187.0 (*)    Bicarbonate 19.3 (*)    TCO2 20 (*)    Acid-base deficit 6.0 (*)    Sodium 120 (*)    Calcium, Ion 1.08 (*)    HCT 27.0 (*)    Hemoglobin 9.2 (*)    All other components within normal limits  SARS CORONAVIRUS 2 (HOSPITAL ORDER, [facility])  URINE CULTURE  C DIFFICILE QUICK SCREEN W PCR REFLEX  GASTROINTESTINAL PANEL BY PCR, STOOL (REPLACES STOOL CULTURE)  PROTIME-INR  LACTIC ACID, PLASMA  AMMONIA  LIPASE, BLOOD  LACTIC ACID, PLASMA  URINALYSIS, ROUTINE W REFLEX MICROSCOPIC  RAPID HIV SCREEN  (HIV 1/2 AB+AG)  HEPATITIS PANEL, ACUTE  VALPROIC ACID LEVEL  LEVETIRACETAM LEVEL  POC OCCULT BLOOD, ED  TYPE AND SCREEN  ABO/RH  TROPONIN I (HIGH SENSITIVITY)  TROPONIN I (HIGH SENSITIVITY)    EKG EKG Interpretation  Date/Time:  Sunday March 26 2019 11:08:41 EDT Ventricular Rate:  61 PR Interval:    QRS Duration: 129 QT Interval:  459 QTC Calculation: 463 R Axis:   103 Text Interpretation:  Atrial fibrillation Nonspecific intraventricular conduction delay Confirmed by Charlesetta Shanks [phone]) on 03/26/2019 1:33:18 PM   Radiology Dg Chest Port 1 View  Result Date: 03/26/2019 CLINICAL DATA:  Vomiting and shortness of breath. EXAM: PORTABLE CHEST 1 VIEW COMPARISON:  July 11, 2013 FINDINGS: Cardiomediastinal silhouette is normal. Mediastinal contours appear intact. Diffusely increased interstitial markings. Multiple less than a cm nodular opacities are seen throughout both lungs with central predominance. Osseous structures are without acute abnormality. Soft tissues are grossly normal. IMPRESSION: Diffusely increased interstitial markings with multiple less than a cm nodular opacities throughout both lungs. These findings may represent mixed pattern pulmonary edema or atypical pneumonia. Electronically Signed   By: Fidela Salisbury M.D.   On: 03/26/2019 12:26    Procedures Procedures (including critical care time) Angiocath insertion Performed by: Charlesetta Shanks  Consent: Verbal consent obtained. Risks and benefits: risks, benefits and alternatives were discussed Time out: Immediately prior to procedure a "time out" was called to verify the correct patient, procedure, equipment, support staff and site/side marked as required.  Preparation: Patient was prepped and draped in the usual sterile fashion.  Vein Location: rt basilic  Yes Ultrasound Guided  Gauge: 20  Normal blood return and flush without difficulty Patient  tolerance: Patient tolerated the procedure well with  no immediate complications.  CRITICAL CARE Performed by: Charlesetta Shanks   Total critical care time: 45 minutes  Critical care time was exclusive of separately billable procedures and treating other patients.  Critical care was necessary to treat or prevent imminent or life-threatening deterioration.  Critical care was time spent personally by me on the following activities: development of treatment plan with patient and/or surrogate as well as nursing, discussions with consultants, evaluation of patient's response to treatment, examination of patient, obtaining history from patient or surrogate, ordering and performing treatments and interventions, ordering and review of laboratory studies, ordering and review of radiographic studies, pulse oximetry and re-evaluation of patient's condition. Medications Ordered in ED Medications  sodium chloride 0.9 % bolus 250 mL (0 mLs Intravenous Stopped 03/26/19 1230)    Followed by  0.9 %  sodium chloride infusion (1,000 mLs Intravenous New Bag/Given 03/26/19 1231)  fentaNYL (SUBLIMAZE) injection 75 mcg (75 mcg Intravenous Given 03/26/19 1147)  ondansetron (ZOFRAN) injection 4 mg (4 mg Intravenous Given 03/26/19 1145)  promethazine (PHENERGAN) injection 25 mg (25 mg Intravenous Given 03/26/19 1301)  sodium chloride 0.9 % bolus 250 mL (0 mLs Intravenous Stopped 03/26/19 1330)     Initial Impression / Assessment and Plan / ED Course  I have reviewed the triage vital signs and the nursing notes.  Pertinent labs & imaging results that were available during my care of the patient were reviewed by me and considered in my medical decision making (see chart for details).  Clinical Course as of Mar 25 1448  Sun Mar 26, 2019  1324 Patient has been given pain and nausea medications.  He continues to be very ill in appearance.  He is not vomiting but has had incontinence of foul-smelling brown watery diarrhea.  No blood visualized in guaiac negative.  Blood  pressure and heart rate remained stable.  Will consult intensivist to discuss potential need for Solu-Cortef or other appropriate interventions given patient's complex medical history of renal transplant.   [MP]  1416 Consult: Reviewed with intensivist Dr. Lynetta Mare.  He advises a sodium of 120 would not be appropriate for hypertonic saline resuscitation.  Continue fluid resuscitation with normal saline for intravascular volume depletion despite anasarca.  Does not advise for Solu-Cortef at this time as patient has stable blood pressures.  Recommends for admission to hospitalist service with continued fluid resuscitation which may resolve renal insufficiency as well as improve hyponatremia.   [MP]    Clinical Course User Index [MP] Charlesetta Shanks, MD      Review of EMR indicates patient has had prior problems with hyponatremia and bouts of chronic diarrhea and abdominal pain.  Presentation appears similar.  At this time he has tested negative for C. difficile and COVID.  Patient has ill and uncomfortable appearance but vital signs are remaining stable without significant hypotension or tachycardia.  Patient does have AKI and is immunocompromise.  He will require admission for hydration and close monitoring of renal function and response to fluids.  Case has been reviewed with intensivist who at this time recommends resuscitation with normal.  saline  Final Clinical Impressions(s) / ED Diagnoses   Final diagnoses:  Renal transplant recipient  Gastroenteritis  Severe comorbid illness  Epigastric pain  Anasarca associated with disorder of kidney    ED Discharge Orders    None       Charlesetta Shanks, MD 03/26/19 1451

## 2019-03-26 NOTE — H&P (Signed)
Date: 03/26/2019               Patient Name:  John Cline. MRN: [id number]  DOB: 12/23/1985 Age / Sex: 33 y.o., male   PCP: Servando Snare, MD         Medical Service: Internal Medicine Teaching Service         Attending Physician: Dr. [name], MD    First Contact: Dr. Madilyn Fireman Pager: [phone]  Second Contact: Dr. Frederico Hamman Pager: [phone]       After Hours (After 5p/  First Contact Pager: [phone]  weekends / holidays): Second Contact Pager: [phone]   Chief Complaint: abdominal pain/vomiting/diarrhea  History of Present Illness: Patient history is severely limited due to patient discomfort and pain. He reports vomiting and diarrhea for around 2 days as well as blood in his stool. He also has stabbing abdominal pain in his upper abdomen that he has had before and states no one can explain.   Meds:     Current Facility-Administered Medications:  .  [START ON 03/27/2019] apixaban (ELIQUIS) tablet 2.5 mg, 2.5 mg, Oral, BID, Santos-Sanchez, Idalys, MD .  Derrill Memo ON 03/27/2019] azaTHIOprine (IMURAN) tablet 50 mg, 50 mg, Oral, Daily, Santos-Sanchez, Merlene Morse, MD .  Derrill Memo ON 03/27/2019] cycloSPORINE modified (NEORAL) capsule 75 mg, 75 mg, Oral, BID, Santos-Sanchez, Idalys, MD .  dicyclomine (BENTYL) capsule 10 mg, 10 mg, Oral, TID AC & HS, Santos-Sanchez, Idalys, MD .  divalproex (DEPAKOTE) DR tablet 500 mg, 500 mg, Oral, BID, Santos-Sanchez, Idalys, MD .  furosemide (LASIX) injection 80 mg, 80 mg, Intravenous, Once, Santos-Sanchez, Idalys, MD .  Lacosamide TABS 250 mg, 250 mg, Oral, BID, Santos-Sanchez, Merlene Morse, MD .  Derrill Memo ON 03/27/2019] lamoTRIgine (LAMICTAL) tablet 50 mg, 50 mg, Oral, Daily, Santos-Sanchez, Merlene Morse, MD .  Derrill Memo ON 03/27/2019] leflunomide (ARAVA) tablet 20 mg, 20 mg, Oral, Daily, Santos-Sanchez, Idalys, MD .  levETIRAcetam (KEPPRA) tablet 1,000 mg, 1,000 mg, Oral, BID, Santos-Sanchez, Idalys, MD .  Derrill Memo ON 03/27/2019] Pancrelipase (Lip-Prot-Amyl)  24000-76000 units CPEP 48,000 Units, 2 capsule, Oral, TID WC, Santos-Sanchez, Idalys, MD .  pantoprazole (PROTONIX) EC tablet 40 mg, 40 mg, Oral, BID, Santos-Sanchez, Merlene Morse, MD .  Derrill Memo ON 03/27/2019] predniSONE (DELTASONE) tablet 10 mg, 10 mg, Oral, Daily, Santos-Sanchez, Idalys, MD .  sodium bicarbonate tablet 1,950 mg, 1,950 mg, Oral, TID, Santos-Sanchez, Idalys, MD .  sodium chloride flush (NS) 0.9 % injection 3 mL, 3 mL, Intravenous, Q12H, Santos-Sanchez, Idalys, MD  Current Outpatient Medications:  .  amLODipine (NORVASC) 5 MG tablet, Take 5 mg by mouth daily., Disp: , Rfl: 3 .  azaTHIOprine (IMURAN) 50 MG tablet, Take 50 mg by mouth daily., Disp: , Rfl: 11 .  carvedilol (COREG) 12.5 MG tablet, Take 25 mg by mouth 2 (two) times daily., Disp: , Rfl: 11 .  carvedilol (COREG) 25 MG tablet, Take 37.5 mg by mouth 2 (two) times daily with a meal., Disp: , Rfl:  .  cloNIDine (CATAPRES) 0.3 MG tablet, Take 0.3 mg by mouth 3 (three) times daily., Disp: , Rfl:  .  CREON 24000-76000 units CPEP, Take 2 capsules by mouth 3 (three) times daily with meals., Disp: , Rfl: 2 .  cycloSPORINE modified (NEORAL) 100 MG capsule, Take 100 mg by mouth daily. TAKE 1 CAPSULE WITH TWO 25MG  CAP TWICE DAILY FOR TOTAL DOSE OF 150MG  TWICE DAILY, Disp: , Rfl: 5 .  cycloSPORINE modified (NEORAL) 25 MG capsule, Take 50 mg by mouth daily. TAKE 2 CAPSULES  BY MOUTH WITH ONE 100MG  CAP TWICE DAILY FOR TOTAL DOSE 150MG  TWICE DAILY, Disp: , Rfl: 11 .  dicyclomine (BENTYL) 10 MG capsule, Take 10 mg by mouth 4 (four) times daily -  before meals and at bedtime., Disp: , Rfl: 0 .  diltiazem (CARDIZEM CD) 120 MG 24 hr capsule, Take 120 mg by mouth daily., Disp: , Rfl: 3 .  divalproex (DEPAKOTE) 250 MG DR tablet, Take 500 mg by mouth 2 (two) times daily., Disp: , Rfl: 12 .  ELIQUIS 5 MG TABS tablet, Take 5 mg by mouth 2 (two) times daily., Disp: , Rfl: 11 .  furosemide (LASIX) 40 MG tablet, Take 40 mg by mouth daily., Disp: , Rfl: 11 .   gabapentin (NEURONTIN) 300 MG capsule, Take 300 mg by mouth 2 (two) times daily., Disp: , Rfl: 11 .  leflunomide (ARAVA) 20 MG tablet, Take 20 mg by mouth daily., Disp: , Rfl: 11 .  levETIRAcetam (KEPPRA) 500 MG tablet, Take 1,000 mg by mouth 2 (two) times daily., Disp: , Rfl: 11 .  mirtazapine (REMERON) 15 MG tablet, Take 7.5 mg by mouth at bedtime., Disp: , Rfl: 3 .  Pancrelipase, Lip-Prot-Amyl, 24000-76000 units CPEP, Take 1 capsule by mouth with snacks., Disp: , Rfl:  .  predniSONE (DELTASONE) 10 MG tablet, Take 10 mg by mouth daily., Disp: , Rfl: 0 .  sodium bicarbonate 650 MG tablet, Take 1,950 mg by mouth 3 (three) times daily., Disp: , Rfl: 11 .  spironolactone (ALDACTONE) 25 MG tablet, Take 25 mg by mouth daily., Disp: , Rfl: 3 .  tamsulosin (FLOMAX) 0.4 MG CAPS capsule, Take 0.4 mg by mouth daily., Disp: , Rfl: 3 .  valGANciclovir (VALCYTE) 450 MG tablet, Take 450 mg by mouth daily., Disp: , Rfl: 1 .  VIMPAT 100 MG TABS, Take 250 mg by mouth 2 (two) times daily., Disp: , Rfl: 5 .  Vitamin D, Ergocalciferol, (DRISDOL) 50000 units CAPS capsule, Take 1 capsule by mouth once a week., Disp: , Rfl: 0  Allergies: Allergies as of 03/26/2019 - Review Complete 03/26/2019  Allergen Reaction Noted  . Morphine and related Rash 06/06/2018   Past Medical History:  Diagnosis Date  . Atrial fibrillation ([place])   . Hypertension   . Seizures ([place])     Family History: unable to elicit due to patient pain and discomfort   Social History: unable to elicit due to patient pain and discomfort   Review of Systems: A complete ROS was negative except as per HPI. ROS limited due to patient pain and discomfort.   Physical Exam: Blood pressure (!) 145/85, pulse (!) 54, temperature (!) 97.5 F (36.4 C), temperature source Oral, resp. rate 10, height 5\' 7"  (1.702 m), weight 79.4 kg, SpO2 99 %.  Physical Exam  Constitutional: He appears distressed.  Patient hunched over in bed in obvious pain and  discomfort; unable to move from this position  HENT:  Moist mucus membranes; cushingoid apperance  Cardiovascular:  Slow heart rate with irregularly irregular rhythm   Pulmonary/Chest:  Diffuse expiratory wheezing  Abdominal:  Unable to examine due to patient discomfort; unable to examine kidney transplant surgical site  Neurological:  Oriented to name and year but not place or month   Skin:  Edematous appearing extremities and face  Nursing note and vitals reviewed. Exam greatly limited by patient discomfort  Labs: Results for orders placed or performed during the hospital encounter of 03/26/19 (from the past 24 hour(s))  Comprehensive metabolic panel  Status: Abnormal   Collection Time: 03/26/19 11:30 AM  Result Value Ref Range   Sodium 121 (L) 135 - 145 mmol/L   Potassium 5.0 3.5 - 5.1 mmol/L   Chloride 92 (L) 98 - 111 mmol/L   CO2 17 (L) 22 - 32 mmol/L   Glucose, Bld 110 (H) 70 - 99 mg/dL   BUN 76 (H) 6 - 20 mg/dL   Creatinine, Ser 5.51 (H) 0.61 - 1.24 mg/dL   Calcium 9.0 8.9 - 10.3 mg/dL   Total Protein 6.2 (L) 6.5 - 8.1 g/dL   Albumin 3.8 3.5 - 5.0 g/dL   AST 17 15 - 41 U/L   ALT 13 0 - 44 U/L   Alkaline Phosphatase 43 38 - 126 U/L   Total Bilirubin 1.2 0.3 - 1.2 mg/dL   GFR calc non Af Amer 12 (L) >60 mL/min   GFR calc Af Amer 14 (L) >60 mL/min   Anion gap 12 5 - 15  CBC WITH DIFFERENTIAL     Status: Abnormal   Collection Time: 03/26/19 11:30 AM  Result Value Ref Range   WBC 7.7 4.0 - 10.5 K/uL   RBC 3.17 (L) 4.22 - 5.81 MIL/uL   Hemoglobin 9.8 (L) 13.0 - 17.0 g/dL   HCT 29.0 (L) 39.0 - 52.0 %   MCV 91.5 80.0 - 100.0 fL   MCH 30.9 26.0 - 34.0 pg   MCHC 33.8 30.0 - 36.0 g/dL   RDW 12.5 11.5 - 15.5 %   Platelets 256 150 - 400 K/uL   nRBC 0.0 0.0 - 0.2 %   Neutrophils Relative % 93 %   Neutro Abs 7.1 1.7 - 7.7 K/uL   Lymphocytes Relative 1 %   Lymphs Abs 0.1 (L) 0.7 - 4.0 K/uL   Monocytes Relative 4 %   Monocytes Absolute 0.3 0.1 - 1.0 K/uL    Eosinophils Relative 1 %   Eosinophils Absolute 0.1 0.0 - 0.5 K/uL   Basophils Relative 0 %   Basophils Absolute 0.0 0.0 - 0.1 K/uL   Immature Granulocytes 1 %   Abs Immature Granulocytes 0.05 0.00 - 0.07 K/uL  Protime-INR     Status: None   Collection Time: 03/26/19 11:30 AM  Result Value Ref Range   Prothrombin Time 14.6 11.4 - 15.2 seconds   INR 1.2 0.8 - 1.2  Type and screen [place]     Status: None   Collection Time: 03/26/19 11:30 AM  Result Value Ref Range   ABO/RH(D) A NEG    Antibody Screen NEG    Sample Expiration      03/29/2019,2359 Performed at [facility], [address] [place], [place] [address]   Lactic acid, plasma     Status: None   Collection Time: 03/26/19 11:30 AM  Result Value Ref Range   Lactic Acid, Venous 0.6 0.5 - 1.9 mmol/L  Ammonia     Status: None   Collection Time: 03/26/19 11:30 AM  Result Value Ref Range   Ammonia 15 9 - 35 umol/L  Lipase, blood     Status: None   Collection Time: 03/26/19 11:30 AM  Result Value Ref Range   Lipase 33 11 - 51 U/L  Troponin I (High Sensitivity)     Status: None   Collection Time: 03/26/19 11:30 AM  Result Value Ref Range   Troponin I (High Sensitivity) 10 <18 ng/L  ABO/Rh     Status: None   Collection Time: 03/26/19 11:30 AM  Result Value Ref Range   ABO/RH(D)      A NEG Performed at [facility] [address] [place], [place] [address]   C difficile quick scan w PCR reflex     Status: None   Collection Time: 03/26/19 11:54 AM   Specimen: STOOL  Result Value Ref Range   C Diff antigen NEGATIVE NEGATIVE   C Diff toxin NEGATIVE NEGATIVE   C Diff interpretation No C. difficile detected.   SARS Coronavirus 2 (CEPHEID- Performed in [facility] lab), Hosp Order     Status: None   Collection Time: 03/26/19 11:54 AM   Specimen: Nasopharyngeal Swab  Result Value Ref Range   SARS Coronavirus 2 NEGATIVE NEGATIVE  POCT I-Stat EG7     Status: Abnormal   Collection  Time: 03/26/19 12:08 PM  Result Value Ref Range   pH, Ven 7.350 7.250 - 7.430   pCO2, Ven 34.9 (L) 44.0 - 60.0 mmHg   pO2, Ven 187.0 (H) 32.0 - 45.0 mmHg   Bicarbonate 19.3 (L) 20.0 - 28.0 mmol/L   TCO2 20 (L) 22 - 32 mmol/L   O2 Saturation 100.0 %   Acid-base deficit 6.0 (H) 0.0 - 2.0 mmol/L   Sodium 120 (L) 135 - 145 mmol/L   Potassium 5.1 3.5 - 5.1 mmol/L   Calcium, Ion 1.08 (L) 1.15 - 1.40 mmol/L   HCT 27.0 (L) 39.0 - 52.0 %   Hemoglobin 9.2 (L) 13.0 - 17.0 g/dL   Patient temperature HIDE    Sample type VENOUS   POC occult blood, ED Provider will collect     Status: None   Collection Time: 03/26/19 12:09 PM  Result Value Ref Range   Fecal Occult Bld NEGATIVE NEGATIVE  Troponin I (High Sensitivity)     Status: None   Collection Time: 03/26/19  1:48 PM  Result Value Ref Range   Troponin I (High Sensitivity) 9 <18 ng/L  Rapid HIV screen (HIV 1/2 Ab+Ag)     Status: None   Collection Time: 03/26/19  1:48 PM  Result Value Ref Range   HIV-1 P24 Antigen - HIV24 NON REACTIVE NON REACTIVE   HIV 1/2 Antibodies NON REACTIVE NON REACTIVE   Interpretation (HIV Ag Ab)      A non reactive test result means that HIV 1 or HIV 2 antibodies and HIV 1 p24 antigen were not detected in the specimen.  Valproic acid level     Status: Abnormal   Collection Time: 03/26/19  1:48 PM  Result Value Ref Range   Valproic Acid Lvl <10 (L) 50.0 - 100.0 ug/mL  Basic metabolic panel     Status: Abnormal   Collection Time: 03/26/19  5:12 PM  Result Value Ref Range   Sodium 122 (L) 135 - 145 mmol/L   Potassium 5.5 (H) 3.5 - 5.1 mmol/L   Chloride 92 (L) 98 - 111 mmol/L   CO2 18 (L) 22 - 32 mmol/L   Glucose, Bld 132 (H) 70 - 99 mg/dL   BUN 79 (H) 6 - 20 mg/dL   Creatinine, Ser 5.72 (H) 0.61 - 1.24 mg/dL   Calcium 8.6 (L) 8.9 - 10.3 mg/dL   GFR calc non Af Amer 12 (L) >60 mL/min   GFR calc Af Amer 14 (L) >60 mL/min   Anion gap 12 5 - 15   EKG: personally reviewed, my interpretation is atrial fibrillation   CXR: personally reviewed, my interpretation is diffusely increased interstitial markings with multiple less than  a cm nodular opacities throughout both lungs. These findings may represent mixed pattern pulmonary edema or atypical pneumonia.   CT Abdomen Pelvis WO Contrast:  Small bilateral pleural effusions, mild ascites, and diffuse mesenteric and body wall edema, consistent with third spacing No other acute findings identified Normal appearance of renal transplant in left iliac fossa. No evidence of hydronephrosis   Assessment: Mr. 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   Primary Problem: Abdominal Pain/Vomiting/Bloody Diarrhea: -patient presents with 2 days of acute upper abdominal pain, vomiting and bloody diarrhea; reports an inability to keep food down but that he has been able to keep his medications down  -per patient and patient chart, he has a hx of these sx with no official diagnosis; he says he's had a colonoscopy and nothing was found -Hgb 9.8, was 10.4 9 months prior -Hemoccult test negative -lipase of 33 -c diff negative -gi panel pending  -CT Abdomen Pelvis WO showed small bilateral pleural effusions, mild ascites, and diffuse mesenteric and body wall edema consistent with third spacing; no other acute findings; normal renal transplant in left iliac fossa; no hydronephrosis  -pain control with bently and tylenol  -will add protonix as well  -fu gi pathogen panel   Active Problems: Hyponatremia:  -Na of 121 on presentation -hx of hyponatremia, 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 Pt edematous on exam.  -given history of too many episodes of emesis to count, patient treated with a 750 ml bolus of NS and 4 hours of NS at 130ml/hr -fu BMP shows NA of  122 -plan to fluid restrict to 1.5 and diurese with 80 mg lasix IV -renal function panels every 4 hrs to fu electrolytes   Hyperkalemia: -Patient potassium 5.0 on admission and 5.5 at most recent BMP in setting of worsening renal function. Creatinine 5.5 up from 2.98 9 months ago.   -receiving insulin, calcium gluconate, Lokelma and albuterol 10  -4 hour renal function tests to fu electrolytes  ESRD s/p transplant (2010): -continue home regimen of cyclosporine, azathioprine and prednisone -creatinine 5.5 up from 2.98 9 months ago -will consult nephrology tomorrow   Atrial Fibrillation/Flutter: -Hold Coreg 37.5 mg , Diltiazem 180 mg due to bradycardia -Continue Apixaban 2.5 mg BID  Hypertension: -continue clonidine 0.3 mg TID -Hold amlodipine 5 mg daily, furosemide 20 mg BID, spironolactone 25 mg daily  Seizure Disorder: -continue Depakote 1000 mg daily, lacosamide 500 mg daily, lamotrigine 50 daily, levetiracetam 2000 mg daily   Dispo: Admit patient to Inpatient with expected length of stay greater than 2 midnights.  Signed: Al Decant, MD 03/26/2019, 6:38 PM  Pager: [phone]

## 2019-03-26 NOTE — ED Notes (Signed)
Bladder scan volume 324ml

## 2019-03-26 NOTE — ED Notes (Signed)
Pt mom Caren Griffins [phone]

## 2019-03-27 ENCOUNTER — Inpatient Hospital Stay (HOSPITAL_COMMUNITY): Payer: Medicare Other

## 2019-03-27 ENCOUNTER — Encounter (HOSPITAL_COMMUNITY): Payer: Self-pay

## 2019-03-27 DIAGNOSIS — R109 Unspecified abdominal pain: Secondary | ICD-10-CM

## 2019-03-27 DIAGNOSIS — G8929 Other chronic pain: Secondary | ICD-10-CM

## 2019-03-27 DIAGNOSIS — Z940 Kidney transplant status: Secondary | ICD-10-CM

## 2019-03-27 DIAGNOSIS — N184 Chronic kidney disease, stage 4 (severe): Secondary | ICD-10-CM

## 2019-03-27 DIAGNOSIS — G40909 Epilepsy, unspecified, not intractable, without status epilepticus: Secondary | ICD-10-CM

## 2019-03-27 DIAGNOSIS — N179 Acute kidney failure, unspecified: Secondary | ICD-10-CM

## 2019-03-27 DIAGNOSIS — K8689 Other specified diseases of pancreas: Secondary | ICD-10-CM

## 2019-03-27 DIAGNOSIS — Z79899 Other long term (current) drug therapy: Secondary | ICD-10-CM

## 2019-03-27 DIAGNOSIS — I129 Hypertensive chronic kidney disease with stage 1 through stage 4 chronic kidney disease, or unspecified chronic kidney disease: Secondary | ICD-10-CM

## 2019-03-27 DIAGNOSIS — E871 Hypo-osmolality and hyponatremia: Principal | ICD-10-CM

## 2019-03-27 DIAGNOSIS — E875 Hyperkalemia: Secondary | ICD-10-CM

## 2019-03-27 DIAGNOSIS — I482 Chronic atrial fibrillation, unspecified: Secondary | ICD-10-CM

## 2019-03-27 DIAGNOSIS — K861 Other chronic pancreatitis: Secondary | ICD-10-CM

## 2019-03-27 DIAGNOSIS — R918 Other nonspecific abnormal finding of lung field: Secondary | ICD-10-CM

## 2019-03-27 DIAGNOSIS — K529 Noninfective gastroenteritis and colitis, unspecified: Secondary | ICD-10-CM

## 2019-03-27 LAB — RENAL FUNCTION PANEL
Albumin: 3.1 g/dL — ABNORMAL LOW (ref 3.5–5.0)
Albumin: 3.4 g/dL — ABNORMAL LOW (ref 3.5–5.0)
Albumin: 3.3 g/dL — ABNORMAL LOW (ref 3.5–5.0)
Albumin: 3.4 g/dL — ABNORMAL LOW (ref 3.5–5.0)
Albumin: 3.2 g/dL — ABNORMAL LOW (ref 3.5–5.0)
Anion gap: 13 (ref 5–15)
Anion gap: 14 (ref 5–15)
Anion gap: 14 (ref 5–15)
Anion gap: 13 (ref 5–15)
Anion gap: 13 (ref 5–15)
BUN: 80 mg/dL — ABNORMAL HIGH (ref 6–20)
BUN: 80 mg/dL — ABNORMAL HIGH (ref 6–20)
BUN: 80 mg/dL — ABNORMAL HIGH (ref 6–20)
BUN: 79 mg/dL — ABNORMAL HIGH (ref 6–20)
BUN: 77 mg/dL — ABNORMAL HIGH (ref 6–20)
CO2: 18 mmol/L — ABNORMAL LOW (ref 22–32)
CO2: 18 mmol/L — ABNORMAL LOW (ref 22–32)
CO2: 18 mmol/L — ABNORMAL LOW (ref 22–32)
CO2: 19 mmol/L — ABNORMAL LOW (ref 22–32)
CO2: 18 mmol/L — ABNORMAL LOW (ref 22–32)
Calcium: 8.7 mg/dL — ABNORMAL LOW (ref 8.9–10.3)
Calcium: 8.7 mg/dL — ABNORMAL LOW (ref 8.9–10.3)
Calcium: 8.7 mg/dL — ABNORMAL LOW (ref 8.9–10.3)
Calcium: 8.6 mg/dL — ABNORMAL LOW (ref 8.9–10.3)
Calcium: 8.4 mg/dL — ABNORMAL LOW (ref 8.9–10.3)
Chloride: 93 mmol/L — ABNORMAL LOW (ref 98–111)
Chloride: 92 mmol/L — ABNORMAL LOW (ref 98–111)
Chloride: 94 mmol/L — ABNORMAL LOW (ref 98–111)
Chloride: 93 mmol/L — ABNORMAL LOW (ref 98–111)
Chloride: 92 mmol/L — ABNORMAL LOW (ref 98–111)
Creatinine, Ser: 5.51 mg/dL — ABNORMAL HIGH (ref 0.61–1.24)
Creatinine, Ser: 5.89 mg/dL — ABNORMAL HIGH (ref 0.61–1.24)
Creatinine, Ser: 5.7 mg/dL — ABNORMAL HIGH (ref 0.61–1.24)
Creatinine, Ser: 5.64 mg/dL — ABNORMAL HIGH (ref 0.61–1.24)
Creatinine, Ser: 5.59 mg/dL — ABNORMAL HIGH (ref 0.61–1.24)
GFR calc Af Amer: 14 mL/min — ABNORMAL LOW (ref >60)
GFR calc Af Amer: 13 mL/min — ABNORMAL LOW (ref >60)
GFR calc Af Amer: 14 mL/min — ABNORMAL LOW (ref >60)
GFR calc Af Amer: 14 mL/min — ABNORMAL LOW (ref >60)
GFR calc Af Amer: 14 mL/min — ABNORMAL LOW (ref >60)
GFR calc non Af Amer: 12 mL/min — ABNORMAL LOW (ref >60)
GFR calc non Af Amer: 11 mL/min — ABNORMAL LOW (ref >60)
GFR calc non Af Amer: 12 mL/min — ABNORMAL LOW (ref >60)
GFR calc non Af Amer: 12 mL/min — ABNORMAL LOW (ref >60)
GFR calc non Af Amer: 12 mL/min — ABNORMAL LOW (ref >60)
Glucose, Bld: 85 mg/dL (ref 70–99)
Glucose, Bld: 62 mg/dL — ABNORMAL LOW (ref 70–99)
Glucose, Bld: 68 mg/dL — ABNORMAL LOW (ref 70–99)
Glucose, Bld: 81 mg/dL (ref 70–99)
Glucose, Bld: 85 mg/dL (ref 70–99)
Phosphorus: 5.3 mg/dL — ABNORMAL HIGH (ref 2.5–4.6)
Phosphorus: 5.3 mg/dL — ABNORMAL HIGH (ref 2.5–4.6)
Phosphorus: 5.3 mg/dL — ABNORMAL HIGH (ref 2.5–4.6)
Phosphorus: 5 mg/dL — ABNORMAL HIGH (ref 2.5–4.6)
Phosphorus: 5.2 mg/dL — ABNORMAL HIGH (ref 2.5–4.6)
Potassium: 5 mmol/L (ref 3.5–5.1)
Potassium: 4.8 mmol/L (ref 3.5–5.1)
Potassium: 4.8 mmol/L (ref 3.5–5.1)
Potassium: 5 mmol/L (ref 3.5–5.1)
Potassium: 5.3 mmol/L — ABNORMAL HIGH (ref 3.5–5.1)
Sodium: 124 mmol/L — ABNORMAL LOW (ref 135–145)
Sodium: 124 mmol/L — ABNORMAL LOW (ref 135–145)
Sodium: 126 mmol/L — ABNORMAL LOW (ref 135–145)
Sodium: 125 mmol/L — ABNORMAL LOW (ref 135–145)
Sodium: 123 mmol/L — ABNORMAL LOW (ref 135–145)

## 2019-03-27 LAB — URINE CULTURE: Culture: NO GROWTH

## 2019-03-27 LAB — GASTROINTESTINAL PANEL BY PCR, STOOL (REPLACES STOOL CULTURE)

## 2019-03-27 LAB — PROTEIN / CREATININE RATIO, URINE
Creatinine, Urine: 37.87 mg/dL
Protein Creatinine Ratio: 0.26 mg/mg{Cre} — ABNORMAL HIGH (ref 0.00–0.15)
Total Protein, Urine: 10 mg/dL

## 2019-03-27 LAB — CBC
HCT: 23.7 % — ABNORMAL LOW (ref 39.0–52.0)
Hemoglobin: 8.4 g/dL — ABNORMAL LOW (ref 13.0–17.0)
MCH: 31.1 pg (ref 26.0–34.0)
MCHC: 35.4 g/dL (ref 30.0–36.0)
MCV: 87.8 fL (ref 80.0–100.0)
Platelets: 222 10*3/uL (ref 150–400)
RBC: 2.7 MIL/uL — ABNORMAL LOW (ref 4.22–5.81)
RDW: 12.4 % (ref 11.5–15.5)
WBC: 5.3 10*3/uL (ref 4.0–10.5)
nRBC: 0 % (ref 0.0–0.2)

## 2019-03-27 LAB — HEMOGLOBIN A1C
Hgb A1c MFr Bld: 4.7 % — ABNORMAL LOW (ref 4.8–5.6)
Mean Plasma Glucose: 88.19 mg/dL

## 2019-03-27 LAB — URINALYSIS, ROUTINE W REFLEX MICROSCOPIC
Bacteria, UA: NONE SEEN
Bilirubin Urine: NEGATIVE
Glucose, UA: NEGATIVE mg/dL
Ketones, ur: NEGATIVE mg/dL
Leukocytes,Ua: NEGATIVE
Nitrite: NEGATIVE
Protein, ur: NEGATIVE mg/dL
Specific Gravity, Urine: 1.005 (ref 1.005–1.030)
pH: 6 (ref 5.0–8.0)

## 2019-03-27 LAB — HEPATITIS PANEL, ACUTE
HCV Ab: <0.1 s/co ratio (ref 0.0–0.9)
Hep A IgM: NEGATIVE
Hep B C IgM: NEGATIVE
Hepatitis B Surface Ag: NEGATIVE

## 2019-03-27 LAB — OSMOLALITY, URINE: Osmolality, Ur: 215 mOsm/kg — ABNORMAL LOW (ref 300–900)

## 2019-03-27 LAB — SODIUM, URINE, RANDOM: Sodium, Ur: 69 mmol/L

## 2019-03-27 MED ORDER — TAMSULOSIN HCL 0.4 MG PO CAPS
0.4000 mg | ORAL_CAPSULE | Freq: Every day | ORAL | Status: DC
  Administered 2019-03-27 – 2019-03-28 (×2): 0.4 mg via ORAL
  Filled 2019-03-27 (×2): qty 1

## 2019-03-27 MED ORDER — HYDROMORPHONE HCL 1 MG/ML IJ SOLN
0.5000 mg | INTRAMUSCULAR | Status: DC | PRN
  Administered 2019-03-27 – 2019-03-30 (×16): 0.5 mg via INTRAVENOUS
  Filled 2019-03-27 (×16): qty 1

## 2019-03-27 MED ORDER — OXYCODONE HCL 5 MG PO TABS
5.0000 mg | ORAL_TABLET | Freq: Four times a day (QID) | ORAL | Status: DC | PRN
  Administered 2019-03-27 – 2019-03-30 (×9): 5 mg via ORAL
  Filled 2019-03-27 (×9): qty 1

## 2019-03-27 NOTE — Progress Notes (Signed)
Subjective: [name] is doing better today than when we saw him on admission. However, he still reports terrible abdominal pain that is too severe for him to get up out of bed or go home with. He states it is different than his pancreatitis pain as it is located more in the LUQ and feels like stabbing pain and like a bomb when off. It is always present but severe episodes come in waves. He states that he feels it in his left  groin and in his left testicle. He has had pain in those areas since his kidney transplant but they have worsened in the last two months. Lidocaine patches and heating pads do not help much. He reports that his diarrhea is not secondary to pancreatic insufficiency as he takes his Creon regularly. We discussed with him that we would continue treating his sx and look at his previous hospitalization records from [facility] to see if we can identify anything. We also told him we would consult nephrology and he was agreeable to that. He has no other complaints or concerns.  Consults: nephrology on board  Objective:  Vital signs in last 24 hours: Vitals:   03/26/19 2253 03/27/19 0500 03/27/19 0610 03/27/19 0827  BP: 108/74  (!) 133/94 129/84  Pulse: (!) 49  69 (!) 39  Resp: 15  14 12   Temp: (!) 94.8 F (34.9 C)  98.5 F (36.9 C) 98.2 F (36.8 C)  TempSrc: Rectal  Rectal Oral  SpO2: 98%  99% 98%  Weight:  79.2 kg    Height:       Physical Exam  Constitutional: He is oriented to person, place, and time.  HENT:  Head: Normocephalic and atraumatic.  Cardiovascular: Intact distal pulses.  Bradycardiac, irregularly irregular rhythm; edematous extremities  Pulmonary/Chest: Effort normal and breath sounds normal. No respiratory distress. He has no wheezes.  Abdominal: Soft. Bowel sounds are normal. He exhibits no distension. There is abdominal tenderness.  Genitourinary:    Genitourinary Comments: Edematous and tender left testicle > right    Neurological: He is alert and  oriented to person, place, and time.  Skin: Skin is warm and dry. No erythema.  Nursing note and vitals reviewed.  IOs: no BMs charted  Labs:  CMP Latest Ref Rng & Units 03/27/2019 03/27/2019 03/26/2019  Glucose 70 - 99 mg/dL 62(L) 85 105(H)  BUN 6 - 20 mg/dL 80(H) 80(H) 81(H)  Creatinine 0.61 - 1.24 mg/dL 5.89(H) 5.51(H) 5.73(H)  Sodium 135 - 145 mmol/L 124(L) 124(L) 123(L)  Potassium 3.5 - 5.1 mmol/L 4.8 5.0 5.7(H)  Chloride 98 - 111 mmol/L 92(L) 93(L) 93(L)  CO2 22 - 32 mmol/L 18(L) 18(L) 16(L)  Calcium 8.9 - 10.3 mg/dL 8.7(L) 8.7(L) 8.6(L)  Total Protein 6.5 - 8.1 g/dL - - -  Total Bilirubin 0.3 - 1.2 mg/dL - - -  Alkaline Phos 38 - 126 U/L - - -  AST 15 - 41 U/L - - -  ALT 0 - 44 U/L - - -   CBC Latest Ref Rng & Units 03/27/2019 03/26/2019 03/26/2019  WBC 4.0 - 10.5 K/uL 5.3 - 7.7  Hemoglobin 13.0 - 17.0 g/dL 8.4(L) 9.2(L) 9.8(L)  Hematocrit 39.0 - 52.0 % 23.7(L) 27.0(L) 29.0(L)  Platelets 150 - 400 K/uL 222 - 256   Assessment/Plan:  Assessment: John Cline is a 33 yo M with a PMHx of ESRD 2/2 to IgA neprhopathy s/p renal transplant (2010), chronic hyponatremia, hypertension, seizure disorder, a fib/flutter and chronical abdominal pain/diarrhea  who presented with the chief complaint of abdominal pain, vomiting and diarrhea who had unremarkable infectious workup and imaging who is somewhat improved on exam with sx pain control.   Plan: Active Problems:    Abdominal pain -patient pain has improved somewhat with sx pain relief with bentyl, tylenol, PPI, anti emetics and prn dilaudid.  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 -pt still edematous on exam -fu with RFTs  -fu urinalysis and urine protein to creatinine ratio     Hyperkalemia -K up to 5.7 last night now 4.8 -received calcium gluconate over night  -monitor with regular RFTs    ESRD s/p transplant: -continue home regimen of cyclosporine, azathioprine and prednisone -creatinine up to 5.89 now from 5.5 on admission and 3.5 10 days ago -continue following creatinine with RFTs -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  Al Decant, MD 03/27/2019, 11:24 AM Pager: [phone]

## 2019-03-27 NOTE — Plan of Care (Signed)
  Problem: Health Behavior/Discharge Planning: Goal: Ability to manage health-related needs will improve Outcome: Not Progressing   

## 2019-03-27 NOTE — Progress Notes (Signed)
This RN was giving his IV pain medicine through the foot IV after flushing prior. All of the sudden, patient's feet started moving/jerking. Patient rose up from supine position and acted like he was reaching for something on his side table. Patient then removed gown, b/p cuff, EKG leads and continued with jerking movements. Patient scooted towards the bottom of the bed, fighting as he went to get out of bed. Patient told this RN "to Let go of me". This RN said his name and seemed to slightly reorient himself. he put his legs back into the bed and scooted up to the head of the bed. MD was paged and arrived at bedside.   Patient did not remember anything from the episode. Patient answered questions appropriately but still seemed somewhat foggy. Patient kept asking about his pain medicine and was reminded that he was given his IV dilaudid prior to his episode.   Calcium gluconate was administered. Patient finally fell asleep and resting comfortably

## 2019-03-27 NOTE — Progress Notes (Signed)
Date: 03/27/2019  Patient name: John Cline.  Medical record number: [id number]  Date of birth: 09/27/1985   I have seen and evaluated John Cline. and discussed their care with the Residency Team.  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 fib and a flutter, chronic abdominal pain and diarrhea attributted to chronic pancreatitis and pancreatic insufficiency, and a seizure disorder.  He presented with acute on chronic abdominal pain, vomiting, and diarrhea.  He states the last time he has been without abdominal pain or abdominal symptoms has been a few months.  I have reviewed his records from [facility] which showed the following: Transplant physician February 21, 2019 indicated chronic pancreatitis and pancreatic insufficiency with chronic diarrhea.  History of probable orchitis during a recent hospitalization was also lifted but apparently his pain had improved with a short course of antibiotics.  He also discussed that his allograft is gradually failing and the need for future dialysis and or a third transplant.  June 30 admission to [facility] for weakness thought to be due to dehydration.  He also had hyponatremia to 125 treated with holding IV fluids.  He also had abdominal pain thought to be due to his chronic abdominal pain which is thought to be due to pancreatitis.  He was on oral pain medicine.  June 20 admission for acute on chronic abdominal pain and diarrhea thought to be secondary to his chronic pancreatitis.  He was treated with Creon, Bentyl, and 18 all of which were outpatient medications.  May 28 [facility] admission for volume overload/anasarca including left scrotal pain.  Treated with opioids, diuresis.  Urine drug screen in at [facility] positive for THC from 20 18-20 20  Vitals:   03/27/19 0610 03/27/19 0827  BP: (!) 133/94 129/84  Pulse: 69 (!) 39  Resp: 14 12  Temp: 98.5 F (36.9 C) 98.2 F (36.8 C)  SpO2: 99% 98%   Temperature as low as 93.5, now 98.2 General resting comfortably in bed complaining of severe abdominal pain HRRR no MRG, pitting edema of the lower extremities but not the thighs.  Positive pitting edema upper extremities ABD hypoactive bowel sounds, tenderness to light palpation per Dr. Webb Silversmith exam GU : Slight edema of the testicles bilaterally but not severe  Pertinent labs Sodium 122, now 124 Creatinine 5.72, now 5.89 CO2 18 Albumin 3.4 Lipase 33, most recent lipase September 20 was 80, as high as 252 on March 14, 2019, was 602 March 04, 2019 WBC 5.3 Hemoglobin 8.4, down from 9.8 on admission, baseline around 9 C. difficile negative GI pathogens panel pending  CT abdomen and pelvis showed small bilateral pleural effusions, mild ascites, diffuse mesenteric and body wall edema.  Chest x-ray read as diffusely increased interstitial markings with multiple <1cm nodule opacities could be due to edema or atypical pneumonia.  Assessment and Plan: I have seen and evaluated the patient as outlined above. 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 fib and a flutter, chronic abdominal pain and diarrhea attributted to chronic pancreatitis and pancreatic insufficiency, and a seizure disorder.  He presented with acute on chronic abdominal pain, vomiting, and diarrhea.  It is currently not clear what is causing his acute exacerbation.  I doubt acute pancreatitis as it seems he has  an elevation of his lipase when this disorder is suspected.  We considered acute mesenteric ischemia as he has A. fib flutter but the length of symptoms is too long to support this diagnosis.  Chronic pancreatitis is a possibility even though his CT scan of admission did not show any changes consistent with chronic pancreatitis and imaging is not  always sensitive.  His chest x-ray showed changes that could be concerning for an atypical pneumonia.  Pneumonias could cause upper quadrant abdominal pain so CT and his chest could be beneficial as he is immunosuppressed.  Finally, he does use marijuana and other diarrhea is not typically associated with cannabinoid hyperemesis, his abdominal pain and vomiting certainly is.  We will nonjudgmentally strongly recommend that he cease all marijuana usage and follow his symptoms.  1.  Abdominal pain, nausea, diarrhea - tx symptomatically.  Strongly recommend cessation of all marijuana usage.  2.  Hyponatremia - he had received IV fluids prior to our team being called so serum and urine osmolality would have been affected.  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  We will did obtain and follow for now.  3.  Abnormal chest x-ray - would recommend CT chest without contrast to further assess as the patient is immunosuppressed.  4.  Acute on chronic renal failure stage IV status post transplant - nephrology consult  Otherwise per Dr. Webb Silversmith note  [name], MD 7/13/202011:02 AM

## 2019-03-27 NOTE — Progress Notes (Signed)
Patient arrived to unit from ED. Patient was transferred to bed, vitals taken, CCMD called, patient oriented to the room and equipment. Patient was alert and oriented x4 when patient arrived to floor.   Patient;s temp was 92.5 rectal which was not communicated by the ED RN. Patient's temperature was taken at 1100 03/26/19. Temperature was checked since then. This RN asked the ED RN to please get a temperature before patient is brought to unit.   Temperature was taken rectally upon arrival and was 93.1. patient was made comfortable and bair hugger was applied.   Patient's temp increase to 94.8 rectal at 2253.

## 2019-03-27 NOTE — Consult Note (Signed)
[facility]  INPATIENT CONSULTATION  Reason for Consultation: hyponatremia, AoCKD s/p renal transplant Requesting Provider: Dr. Lynnae January  HPI: John Cline. is an 33 y.o. 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   He presented from home to [facility] ED yesterday via EMS after developing acute on chronic abdominal pain, nausea, emesis and several episodes of loose stool.  Presenting VS show T 97.5, BP 145/85, HR 62, 99% on 4L.  He was noted to be hyponatremic to 122, hyperkalemic to 5.5 with AoCKD BUN 79, Cr 5.72 up from recent baseline of 3-3.5+.  He was given NS 76mL + 133mL/hr x 4 hrs in ED.  Admitting team reviewed history (h/o similar presentation and suspected SIADH) and fluid restricted, diuresed with lasix 80 IV.    This morning labs shows Na 124, K 4.8, Bicarb 18, BUN 80, Cr 5.89.   UOP yesterday 600, today 957mL already.   Tolerating fluids but not interested in food as he knows he will have emesis.   He tells me over the past few months he's gained what he estimates to be 25lbs of fluid.    He's been admitted to [facility]: 5/21-23 for orchitis treated with CTX+ azithromycin, pain medications 5/28-6/6 for volume overload, worsening renal function. Treated with IV diuretics. 6/1 renal biopsy with no rejection.  6/30-7/2  For weakness, initial c/f infection with low T but infectious eval unrevealing.  Ultimately ? Dehydration. Had moderate hyponatremia during that admission with Na 125-128 which was treated with fluid restriction.  PMH: Past Medical History:  Diagnosis Date  . Atrial fibrillation ([place])   . Hypertension   . Seizures ([facility])    PSH: Past Surgical History:  Procedure Laterality Date  . KIDNEY TRANSPLANT       Past Medical History:  Diagnosis Date   . Atrial fibrillation ([place])   . Hypertension   . Seizures ([place])     Medications:  I have reviewed the patient's current medications.  Medications Prior to Admission  Medication Sig Dispense Refill  . acetaminophen (TYLENOL) 500 MG tablet Take 500 mg by mouth every 6 (six) hours as needed for mild pain or headache.    Marland Kitchen amLODipine (NORVASC) 5 MG tablet Take 5 mg by mouth daily.  3  . apixaban (ELIQUIS) 2.5 MG TABS tablet Take 2.5 mg by mouth 2 (two) times daily.    Marland Kitchen azaTHIOprine (IMURAN) 50 MG tablet Take 100 mg by mouth daily.   11  . carvedilol (COREG) 12.5 MG tablet Take 25 mg by mouth 2 (two) times daily.  11  . carvedilol (COREG) 25 MG tablet Take 37.5 mg by mouth 2 (two) times daily with a meal.    . Cholecalciferol (VITAMIN D3) 1.25 MG (50000 UT) CAPS Take 50,000 Units by mouth every 7 (seven) days.    . cloNIDine (CATAPRES) 0.3 MG tablet Take 0.3 mg by mouth 3 (three) times daily.    Marland Kitchen CREON 24000-76000 units CPEP Take 2 capsules by mouth 3 (three) times daily with meals.  2  . cycloSPORINE modified (NEORAL) 100 MG capsule Take 100 mg by mouth daily. TAKE 1 CAPSULE WITH TWO 25MG  CAP TWICE DAILY FOR TOTAL DOSE OF 150MG  TWICE DAILY  5  . cycloSPORINE modified (NEORAL) 25 MG capsule Take 75 mg by mouth 2 (two) times daily.  11  . dicyclomine (BENTYL) 10 MG capsule Take 20 mg by mouth 4 (four) times daily -  before meals and at bedtime.   0  . diltiazem (CARDIZEM CD) 120 MG 24 hr capsule Take 120 mg by mouth daily.  3  . diltiazem (CARDIZEM CD) 180 MG 24 hr capsule Take 180 mg by mouth daily.    . divalproex (DEPAKOTE) 250 MG DR tablet Take 500 mg by mouth 2 (two) times daily.  12  . ELIQUIS 5 MG TABS tablet Take 5 mg by mouth 2 (two) times daily.  11  . furosemide (LASIX) 40 MG tablet Take 40 mg by mouth 2 (two) times daily.   11  . gabapentin (NEURONTIN) 300 MG capsule Take 300 mg by mouth 2 (two) times daily.  11  . lamoTRIgine (LAMICTAL) 25 MG tablet Take 50-100 mg by mouth See  admin instructions. Take 50 mg by mouth two times a day for 1 week, then increase to 100 mg two times a day    . leflunomide (ARAVA) 20 MG tablet Take 20 mg by mouth daily.  11  . levETIRAcetam (KEPPRA) 500 MG tablet Take 1,000 mg by mouth 2 (two) times daily.  11  . magnesium oxide (MAG-OX) 400 MG tablet Take 400 mg by mouth daily.    . mirtazapine (REMERON) 15 MG tablet Take 7.5 mg by mouth at bedtime.  3  . ondansetron (ZOFRAN) 8 MG tablet Take 8 mg by mouth every 8 (eight) hours as needed for nausea or vomiting.    . Pancrelipase, Lip-Prot-Amyl, 24000-76000 units CPEP Take 1-2 capsules by mouth See admin instructions. Take 2 capsules by mouth three times a day with meals and 1 capsule with each snack    . pantoprazole (PROTONIX) 20 MG tablet Take 20 mg by mouth daily.    . polyethylene glycol powder (GLYCOLAX/MIRALAX) 17 GM/SCOOP powder Take 17 g by mouth daily as needed for mild constipation.    . predniSONE (DELTASONE) 10 MG tablet Take 10 mg by mouth daily.  0  . sildenafil (VIAGRA) 50 MG tablet Take 50 mg by mouth daily as needed for erectile dysfunction.    . sodium bicarbonate 650 MG tablet Take 1,300 mg by mouth 3 (three) times daily.   11  . spironolactone (ALDACTONE) 25 MG tablet Take 25 mg by mouth daily.  3  . tamsulosin (FLOMAX) 0.4 MG CAPS capsule Take 0.4 mg by mouth daily.  3  . valGANciclovir (VALCYTE) 450 MG tablet Take 450 mg by mouth daily.  1  . VIMPAT 100 MG TABS Take 250 mg by mouth 2 (two) times daily.  5  . Vitamin D, Ergocalciferol, (DRISDOL) 50000 units CAPS capsule Take 1 capsule by mouth once a week.  0    ALLERGIES:   Allergies  Allergen Reactions  . Dapsone Other (See Comments)    Methemoglobinemia     . Lipase Other (See Comments)    Constipation   . Morphine And Related Rash    FAM HX: History reviewed. No pertinent family history.  Social History:   reports that he has never smoked. He does not have any smokeless tobacco history on file. No  history on file for alcohol and drug.  ROS: 12 system ROS neg except per HPI   Blood pressure 129/84, pulse (!) 39, temperature 98.2 F (36.8 C), temperature source Oral, resp. rate 12, height 5\' 7"  (1.702 m), weight 79.2 kg, SpO2 98 %. PHYSICAL EXAM: Gen: chronically ill appearing lying  flat in bed  Eyes:  Anicteric, EOMI, periorbital edema ENT: MMM Neck: supple without JVD CV:  RRR - on  My exam HR in the 60s, no rub Abd:  Soft, mod TTP, LLQ renal transplant mildly TTP - no rebound or guarding, +BS Back: +sacral edema Lungs: no rales, normal WOB GU: mild scrotal edema, he is able to move scrotum without discomfort Extr:  2+ pitting LE edema, trace UE edema Neuro: grossly nonfocal except fine tremor Skin: no rashes. Scattered small papules on face, upper chest   Results for orders placed or performed during the hospital encounter of 03/26/19 (from the past 48 hour(s))  Comprehensive metabolic panel     Status: Abnormal   Collection Time: 03/26/19 11:30 AM  Result Value Ref Range   Sodium 121 (L) 135 - 145 mmol/L   Potassium 5.0 3.5 - 5.1 mmol/L   Chloride 92 (L) 98 - 111 mmol/L   CO2 17 (L) 22 - 32 mmol/L   Glucose, Bld 110 (H) 70 - 99 mg/dL   BUN 76 (H) 6 - 20 mg/dL   Creatinine, Ser 5.51 (H) 0.61 - 1.24 mg/dL   Calcium 9.0 8.9 - 10.3 mg/dL   Total Protein 6.2 (L) 6.5 - 8.1 g/dL   Albumin 3.8 3.5 - 5.0 g/dL   AST 17 15 - 41 U/L   ALT 13 0 - 44 U/L   Alkaline Phosphatase 43 38 - 126 U/L   Total Bilirubin 1.2 0.3 - 1.2 mg/dL   GFR calc non Af Amer 12 (L) >60 mL/min   GFR calc Af Amer 14 (L) >60 mL/min   Anion gap 12 5 - 15    Comment: Performed at [facility], [address] [place], [place] [address]  CBC WITH DIFFERENTIAL     Status: Abnormal   Collection Time: 03/26/19 11:30 AM  Result Value Ref Range   WBC 7.7 4.0 - 10.5 K/uL   RBC 3.17 (L) 4.22 - 5.81 MIL/uL   Hemoglobin 9.8 (L) 13.0 - 17.0 g/dL   HCT 29.0 (L) 39.0 - 52.0 %   MCV 91.5 80.0 - 100.0 fL    MCH 30.9 26.0 - 34.0 pg   MCHC 33.8 30.0 - 36.0 g/dL   RDW 12.5 11.5 - 15.5 %   Platelets 256 150 - 400 K/uL   nRBC 0.0 0.0 - 0.2 %   Neutrophils Relative % 93 %   Neutro Abs 7.1 1.7 - 7.7 K/uL   Lymphocytes Relative 1 %   Lymphs Abs 0.1 (L) 0.7 - 4.0 K/uL   Monocytes Relative 4 %   Monocytes Absolute 0.3 0.1 - 1.0 K/uL   Eosinophils Relative 1 %   Eosinophils Absolute 0.1 0.0 - 0.5 K/uL   Basophils Relative 0 %   Basophils Absolute 0.0 0.0 - 0.1 K/uL   Immature Granulocytes 1 %   Abs Immature Granulocytes 0.05 0.00 - 0.07 K/uL    Comment: Performed at [facility], [address], [place], [place] [address]  Protime-INR     Status: None   Collection Time: 03/26/19 11:30 AM  Result Value Ref Range   Prothrombin Time 14.6 11.4 - 15.2 seconds   INR 1.2 0.8 - 1.2    Comment: (NOTE) INR goal varies based on device and disease states. Performed at [facility], [place] [address], [place], [place] [address]   Type and screen [place]     Status: None   Collection Time:  03/26/19 11:30 AM  Result Value Ref Range   ABO/RH(D) A NEG    Antibody Screen NEG    Sample Expiration      03/29/2019,2359 Performed at [facility], [place] [address], [place], [place] [address]   Lactic acid, plasma     Status: None   Collection Time: 03/26/19 11:30 AM  Result Value Ref Range   Lactic Acid, Venous 0.6 0.5 - 1.9 mmol/L    Comment: Performed at [facility] [address] [place], [place] [address]  Ammonia     Status: None   Collection Time: 03/26/19 11:30 AM  Result Value Ref Range   Ammonia 15 9 - 35 umol/L    Comment: Performed at [facility], [place] [address], [place], [place] [address]  Lipase, blood     Status: None   Collection Time: 03/26/19 11:30 AM  Result Value Ref Range   Lipase 33 11 - 51 U/L    Comment: Performed at [facility] [address] [place], [place] [address]  Troponin I (High Sensitivity)     Status: None    Collection Time: 03/26/19 11:30 AM  Result Value Ref Range   Troponin I (High Sensitivity) 10 <18 ng/L    Comment: (NOTE) Elevated high sensitivity troponin I (hsTnI) values and significant  changes across serial measurements may suggest ACS but many other  chronic and acute conditions are known to elevate hsTnI results.  Refer to the "Links" section for chest pain algorithms and additional  guidance. Performed at [facility], [place] [address], [place], [place] [address]   ABO/Rh     Status: None   Collection Time: 03/26/19 11:30 AM  Result Value Ref Range   ABO/RH(D)      A NEG Performed at [facility] [address] [place], [place] [address]   C difficile quick scan w PCR reflex     Status: None   Collection Time: 03/26/19 11:54 AM   Specimen: STOOL  Result Value Ref Range   C Diff antigen NEGATIVE NEGATIVE   C Diff toxin NEGATIVE NEGATIVE   C Diff interpretation No C. difficile detected.     Comment: Performed at [facility], [place] [address], [place], [place] [address]  SARS Coronavirus 2 (CEPHEID- Performed in [facility] hospital lab), Hosp Order     Status: None   Collection Time: 03/26/19 11:54 AM   Specimen: Nasopharyngeal Swab  Result Value Ref Range   SARS Coronavirus 2 NEGATIVE NEGATIVE    Comment: (NOTE) If result is NEGATIVE SARS-CoV-2 target nucleic acids are NOT DETECTED. The SARS-CoV-2 RNA is generally detectable in upper and lower  respiratory specimens during the acute phase of infection. The lowest  concentration of SARS-CoV-2 viral copies this assay can detect is 250  copies / mL. A negative result does not preclude SARS-CoV-2 infection  and should not be used as the sole basis for treatment or other  patient management decisions.  A negative result may occur with  improper specimen collection / handling, submission of specimen other  than nasopharyngeal swab, presence of viral mutation(s) within the  areas targeted by this assay,  and inadequate number of viral copies  (<250 copies / mL). A negative result must be combined with clinical  observations, patient history, and epidemiological information. If result is POSITIVE SARS-CoV-2 target nucleic acids are DETECTED. The SARS-CoV-2 RNA is generally detectable in upper and lower  respiratory  specimens dur ing the acute phase of infection.  Positive  results are indicative of active infection with SARS-CoV-2.  Clinical  correlation with patient history and other diagnostic information is  necessary to determine patient infection status.  Positive results do  not rule out bacterial infection or co-infection with other viruses. If result is PRESUMPTIVE POSTIVE SARS-CoV-2 nucleic acids MAY BE PRESENT.   A presumptive positive result was obtained on the submitted specimen  and confirmed on repeat testing.  While 2019 novel coronavirus  (SARS-CoV-2) nucleic acids may be present in the submitted sample  additional confirmatory testing may be necessary for epidemiological  and / or clinical management purposes  to differentiate between  SARS-CoV-2 and other Sarbecovirus currently known to infect humans.  If clinically indicated additional testing with an alternate test  methodology [phone]) is advised. The SARS-CoV-2 RNA is generally  detectable in upper and lower respiratory sp ecimens during the acute  phase of infection. The expected result is Negative. Fact Sheet for Patients:  StrictlyIdeas.no Fact Sheet for Healthcare Providers: BankingDealers.co.za This test is not yet approved or cleared by the [place] FDA and has been authorized for detection and/or diagnosis of SARS-CoV-2 by FDA under an Emergency Use Authorization (EUA).  This EUA will remain in effect (meaning this test can be used) for the duration of the COVID-19 declaration under Section 564(b)(1) of the Act, 21 U.S.C. section 360bbb-3(b)(1), unless  the authorization is terminated or revoked sooner. Performed at [facility], [place] [address] [place], [place] [address]   POCT I-Stat EG7     Status: Abnormal   Collection Time: 03/26/19 12:08 PM  Result Value Ref Range   pH, Ven 7.350 7.250 - 7.430   pCO2, Ven 34.9 (L) 44.0 - 60.0 mmHg   pO2, Ven 187.0 (H) 32.0 - 45.0 mmHg   Bicarbonate 19.3 (L) 20.0 - 28.0 mmol/L   TCO2 20 (L) 22 - 32 mmol/L   O2 Saturation 100.0 %   Acid-base deficit 6.0 (H) 0.0 - 2.0 mmol/L   Sodium 120 (L) 135 - 145 mmol/L   Potassium 5.1 3.5 - 5.1 mmol/L   Calcium, Ion 1.08 (L) 1.15 - 1.40 mmol/L   HCT 27.0 (L) 39.0 - 52.0 %   Hemoglobin 9.2 (L) 13.0 - 17.0 g/dL   Patient temperature HIDE    Sample type VENOUS   POC occult blood, ED Provider will collect     Status: None   Collection Time: 03/26/19 12:09 PM  Result Value Ref Range   Fecal Occult Bld NEGATIVE NEGATIVE  Troponin I (High Sensitivity)     Status: None   Collection Time: 03/26/19  1:48 PM  Result Value Ref Range   Troponin I (High Sensitivity) 9 <18 ng/L    Comment: (NOTE) Elevated high sensitivity troponin I (hsTnI) values and significant  changes across serial measurements may suggest ACS but many other  chronic and acute conditions are known to elevate hsTnI results.  Refer to the "Links" section for chest pain algorithms and additional  guidance. Performed at [facility], [place] [address] [place], [place] [address]   Rapid HIV screen (HIV 1/2 Ab+Ag)     Status: None   Collection Time: 03/26/19  1:48 PM  Result Value Ref Range   HIV-1 P24 Antigen - HIV24 NON REACTIVE NON REACTIVE   HIV 1/2 Antibodies NON REACTIVE NON REACTIVE   Interpretation (HIV Ag Ab)      A non reactive test result means that  HIV 1 or HIV 2 antibodies and HIV 1 p24 antigen were not detected in the specimen.    Comment: NON REACTIVE Performed at [facility], [place] [address], [place], [place] [address]   Hepatitis panel, acute     Status: None    Collection Time: 03/26/19  1:48 PM  Result Value Ref Range   Hepatitis B Surface Ag Negative Negative   HCV Ab <0.1 0.0 - 0.9 s/co ratio    Comment: (NOTE)                                  Negative:     < 0.8                             Indeterminate: 0.8 - 0.9                                  Positive:     > 0.9 The CDC recommends that a positive HCV antibody result be followed up with a HCV Nucleic Acid Amplification test ([id number]). Performed At: [facility] [place], [place] [id number] Rush Farmer MD [id number]    Hep A IgM Negative Negative   Hep B C IgM Negative Negative  Valproic acid level     Status: Abnormal   Collection Time: 03/26/19  1:48 PM  Result Value Ref Range   Valproic Acid Lvl <10 (L) 50.0 - 100.0 ug/mL    Comment: RESULTS CONFIRMED BY MANUAL DILUTION Performed at [facility] [address], [place], [place] [address]   Basic metabolic panel     Status: Abnormal   Collection Time: 03/26/19  5:12 PM  Result Value Ref Range   Sodium 122 (L) 135 - 145 mmol/L   Potassium 5.5 (H) 3.5 - 5.1 mmol/L   Chloride 92 (L) 98 - 111 mmol/L   CO2 18 (L) 22 - 32 mmol/L   Glucose, Bld 132 (H) 70 - 99 mg/dL   BUN 79 (H) 6 - 20 mg/dL   Creatinine, Ser 5.72 (H) 0.61 - 1.24 mg/dL   Calcium 8.6 (L) 8.9 - 10.3 mg/dL   GFR calc non Af Amer 12 (L) >60 mL/min   GFR calc Af Amer 14 (L) >60 mL/min   Anion gap 12 5 - 15    Comment: Performed at [facility] [address] [place], [place] [address]  Renal function panel     Status: Abnormal   Collection Time: 03/26/19  9:33 PM  Result Value Ref Range   Sodium 123 (L) 135 - 145 mmol/L   Potassium 5.7 (H) 3.5 - 5.1 mmol/L   Chloride 93 (L) 98 - 111 mmol/L   CO2 16 (L) 22 - 32 mmol/L   Glucose, Bld 105 (H) 70 - 99 mg/dL   BUN 81 (H) 6 - 20 mg/dL   Creatinine, Ser 5.73 (H) 0.61 - 1.24 mg/dL   Calcium 8.6 (L) 8.9 - 10.3 mg/dL   Phosphorus 5.5 (H) 2.5 - 4.6 mg/dL   Albumin 3.4 (L) 3.5  - 5.0 g/dL   GFR calc non Af Amer 12 (L) >60 mL/min   GFR calc Af Amer 14 (L) >60 mL/min   Anion gap 14 5 - 15    Comment: Performed at [facility], [place] [address] [place], [place] [address]  CBC     Status: Abnormal   Collection Time: 03/27/19  3:21 AM  Result Value Ref Range   WBC 5.3 4.0 - 10.5 K/uL   RBC 2.70 (L) 4.22 - 5.81 MIL/uL   Hemoglobin 8.4 (L) 13.0 - 17.0 g/dL   HCT 23.7 (L) 39.0 - 52.0 %   MCV 87.8 80.0 - 100.0 fL   MCH 31.1 26.0 - 34.0 pg   MCHC 35.4 30.0 - 36.0 g/dL   RDW 12.4 11.5 - 15.5 %   Platelets 222 150 - 400 K/uL   nRBC 0.0 0.0 - 0.2 %    Comment: Performed at [facility], [place] [address], [place], [place] [address]  Renal function panel     Status: Abnormal   Collection Time: 03/27/19  3:21 AM  Result Value Ref Range   Sodium 124 (L) 135 - 145 mmol/L   Potassium 5.0 3.5 - 5.1 mmol/L   Chloride 93 (L) 98 - 111 mmol/L   CO2 18 (L) 22 - 32 mmol/L   Glucose, Bld 85 70 - 99 mg/dL   BUN 80 (H) 6 - 20 mg/dL   Creatinine, Ser 5.51 (H) 0.61 - 1.24 mg/dL   Calcium 8.7 (L) 8.9 - 10.3 mg/dL   Phosphorus 5.3 (H) 2.5 - 4.6 mg/dL   Albumin 3.1 (L) 3.5 - 5.0 g/dL   GFR calc non Af Amer 12 (L) >60 mL/min   GFR calc Af Amer 14 (L) >60 mL/min   Anion gap 13 5 - 15    Comment: Performed at [facility] [address] [place], [place] [address]  Renal function panel     Status: Abnormal   Collection Time: 03/27/19  7:09 AM  Result Value Ref Range   Sodium 124 (L) 135 - 145 mmol/L   Potassium 4.8 3.5 - 5.1 mmol/L   Chloride 92 (L) 98 - 111 mmol/L   CO2 18 (L) 22 - 32 mmol/L   Glucose, Bld 62 (L) 70 - 99 mg/dL   BUN 80 (H) 6 - 20 mg/dL   Creatinine, Ser 5.89 (H) 0.61 - 1.24 mg/dL   Calcium 8.7 (L) 8.9 - 10.3 mg/dL   Phosphorus 5.3 (H) 2.5 - 4.6 mg/dL   Albumin 3.4 (L) 3.5 - 5.0 g/dL   GFR calc non Af Amer 11 (L) >60 mL/min   GFR calc Af Amer 13 (L) >60 mL/min   Anion gap 14 5 - 15    Comment: Performed at [facility] [address] [place], [place] [address]  Hemoglobin A1c     Status: Abnormal   Collection Time: 03/27/19  7:40 AM  Result Value Ref Range   Hgb A1c MFr Bld 4.7 (L) 4.8 - 5.6 %    Comment: (NOTE) Pre diabetes:          5.7%-6.4% Diabetes:              >6.4% Glycemic control for   <7.0% adults with diabetes    Mean Plasma Glucose 88.19 mg/dL    Comment: Performed at [facility] [address] [place], [place] [address]  Renal function panel     Status: Abnormal   Collection Time: 03/27/19 10:49 AM  Result Value Ref Range   Sodium 126 (L) 135 - 145 mmol/L   Potassium 4.8 3.5 - 5.1 mmol/L   Chloride 94 (L) 98 - 111 mmol/L  CO2 18 (L) 22 - 32 mmol/L   Glucose, Bld 68 (L) 70 - 99 mg/dL   BUN 80 (H) 6 - 20 mg/dL   Creatinine, Ser 5.70 (H) 0.61 - 1.24 mg/dL   Calcium 8.7 (L) 8.9 - 10.3 mg/dL   Phosphorus 5.3 (H) 2.5 - 4.6 mg/dL   Albumin 3.3 (L) 3.5 - 5.0 g/dL   GFR calc non Af Amer 12 (L) >60 mL/min   GFR calc Af Amer 14 (L) >60 mL/min   Anion gap 14 5 - 15    Comment: Performed at [facility] [address], [place], [place] [address]  Urinalysis, Routine w reflex microscopic     Status: Abnormal   Collection Time: 03/27/19 12:23 PM  Result Value Ref Range   Color, Urine STRAW (A) YELLOW   APPearance CLEAR CLEAR   Specific Gravity, Urine 1.005 1.005 - 1.030   pH 6.0 5.0 - 8.0   Glucose, UA NEGATIVE NEGATIVE mg/dL   Hgb urine dipstick SMALL (A) NEGATIVE   Bilirubin Urine NEGATIVE NEGATIVE   Ketones, ur NEGATIVE NEGATIVE mg/dL   Protein, ur NEGATIVE NEGATIVE mg/dL   Nitrite NEGATIVE NEGATIVE   Leukocytes,Ua NEGATIVE NEGATIVE   WBC, UA 0-5 0 - 5 WBC/hpf   Bacteria, UA NONE SEEN NONE SEEN    Comment: Performed at [facility] [address] [place], [place] [address]  Protein / creatinine ratio, urine     Status: Abnormal   Collection Time: 03/27/19 12:27 PM  Result Value Ref Range   Creatinine, Urine 37.87 mg/dL   Total Protein, Urine 10 mg/dL    Comment: NO  NORMAL RANGE ESTABLISHED FOR THIS TEST   Protein Creatinine Ratio 0.26 (H) 0.00 - 0.15 mg/mg[Cre]    Comment: Performed at [facility], [place] [address], [place], [place] [address]    Ct Abdomen Pelvis Wo Contrast  Result Date: 03/26/2019 CLINICAL DATA:  Left-sided abdominal pain and vomiting. Rectal bleeding. Diarrhea. Kidney transplant patient. EXAM: CT ABDOMEN AND PELVIS WITHOUT CONTRAST TECHNIQUE: Multidetector CT imaging of the abdomen and pelvis was performed following the standard protocol without IV contrast. COMPARISON:  None. FINDINGS: Lower chest: Small bilateral pleural effusions. 7 mm subpleural nodular opacity in the posterior right lower lobe on image 20/series 5. Hepatobiliary: No mass visualized on this unenhanced exam. Gallbladder is unremarkable. Pancreas: No mass or inflammatory process visualized on this unenhanced exam. Spleen:  Within normal limits in size. Adrenals/Urinary tract: Previous bilateral nephrectomies. Normal appearance of renal transplant in left iliac fossa on this unenhanced exam. No evidence of hydronephrosis. Postop changes noted in the right iliac fossa, likely from previous renal transplant in this location. Unremarkable unopacified urinary bladder. Stomach/Bowel: No evidence of obstruction, inflammatory process, or abnormal fluid collections. Vascular/Lymphatic: No pathologically enlarged lymph nodes identified. No evidence of abdominal aortic aneurysm. Reproductive:  No mass or other significant abnormality. Other:  Diffuse mesenteric and body wall edema. Mild ascites. Musculoskeletal:  No suspicious bone lesions identified. IMPRESSION: 1. Small bilateral pleural effusions, mild ascites, and diffuse mesenteric and body wall edema, consistent with third spacing. 2. No other acute findings identified. 3. Normal appearance of renal transplant in left iliac fossa. No evidence of hydronephrosis. Electronically Signed   By: Marlaine Hind M.D.   On: 03/26/2019 15:03    Dg Chest Port 1 View  Result Date: 03/26/2019 CLINICAL DATA:  Vomiting and shortness of breath. EXAM: PORTABLE CHEST 1 VIEW COMPARISON:  July 11, 2013 FINDINGS: Cardiomediastinal silhouette is  normal. Mediastinal contours appear intact. Diffusely increased interstitial markings. Multiple less than a cm nodular opacities are seen throughout both lungs with central predominance. Osseous structures are without acute abnormality. Soft tissues are grossly normal. IMPRESSION: Diffusely increased interstitial markings with multiple less than a cm nodular opacities throughout both lungs. These findings may represent mixed pattern pulmonary edema or atypical pneumonia. Electronically Signed   By: Fidela Salisbury M.D.   On: 03/26/2019 12:26    Assessment/Plan **chronic abdominal pain + diarrhea:  Has had extensive evaluations and etiology typically attributed to chronic pancreatitis.  Imaging with CT here unrevealing.  In setting of acute worsening in transplant immunosuppressed host r/o infectious etiologies needed.  C diff negative, GI pathogen panel pending.  Check CMV VL.  If symptoms not improving with supportive care would consider involving GI for endoscopies.  Agree with primary teams continuation of home meds and consideration for testicular US.    **hyponatremia:  Appears hypervolemic on exam with pitting edema and report of 25lb wt gain + has h/o SIADH and has improved with fluid restriction + diuresis in the past.  Rec'd fluids yesterday followed by 1 dose lasix last PM and is on 1.5L fluid restriction.  Na improved from 122 to 126.   I favor holding the course for right now - no IVF and no diuretics. Strict I/Os imperative.  Will assess frequently and make further recs on fluids/diuretics.  Will send urine sodium and osm for interest now - will need to cautiously interpret results.  **ESRD s/p renal transplant x 2:  Baseline Cr lately around mid 3s, some as high as 4s.  Biopsy a few weeks ago  without acute rejection.  Now with AKI Cr in the mid 5s this admission.  UA not suggestive of pyelonephritis.  Says compliant with medications.  Check renal transplant [place] and follow bladder scans - has h/o mild urinary retention.  No plans for renal biopsy but will follow. Per chart has been relisted for another transplant.   Cont home cysA, azathioprine, prednisone.  CysA level in the AM ordered.  **HTN:   Well controlled  Currently.   **Hyperkalemia: resolved   **Anemia:  Hb 8s- similar to recents at [facility]. Follow.   **Abnormal CXR: not c/o pulmonary symptoms and exam seems normal but agree with cross sectional imaging to further eval in immunosuppressed pt.   Justin Mend 03/27/2019, 2:33 PM

## 2019-03-27 NOTE — Progress Notes (Signed)
Orders were place at 1845 for lasixs, calcium gluconate, 10 insulin/D50. Which was not done prior to patient transported to unit. This RN was attempting to catch up on patient's meds. Attempted to push D50 into patient's left IV, patient screamed out in pain. Attempted to flush right IV and was occluded. Both IVs were removed.  IV team consulted. Several attempts were made by the IV team but was unsuccessful.   MD approved for IV placed in left foot and also approved for foot stick if lab is unable to get labs from hands/arms.

## 2019-03-27 NOTE — Progress Notes (Signed)
  Paged by nursing at 1:02, due to patient becoming disoriented. Nursing stated she was pushing Dilaudid when pt became confused, sat up in bed, was trying to get out of bed, and appeared to have jerking hand movements. At arrival to the bedside, pt was laying still back in bed but seemed confused. Nursing stated it took for her to get help from others to get him laying back down as he ended up diagonal with his feet hanging off the bed. On exam, pt was oriented to himself and place. He was confused and couldn't remember what had just happened. He stated he came into the hospital for recent falls at home. Pt appeared calm at that time.    Today's Vitals   03/26/19 2027 03/26/19 2051 03/26/19 2101 03/26/19 2253  BP:  (!) 150/114  108/74  Pulse:  (!) 59  (!) 49  Resp:  12  15  Temp: (!) 92.5 F (33.6 C) (!) 93.1 F (33.9 C)  (!) 94.8 F (34.9 C)  TempSrc: Rectal Rectal  Rectal  SpO2: 100% 96%  98%  Weight:   79.2 kg   Height:      PainSc:       Body mass index is 27.35 kg/m.   Plan At bedside arrival, pt appeared to have calmed down since episode though still confused. Responding appropriately to questions, and doesn't appear post-ictal. Vitals stable in the room with HR in the 60s. Pt able to move all four extremities spontaneously. Don't believe there is a need for a 1 to 1 sitter at this time. -will give 1,000mg  levetiracetam now as pt did not receive evening dose -previous K 5.7, pt received 1 dose Lokelma earlier tonight while trying to get IV access -will give 1g calcium gluconate as pt has IV access in L foot now -follow-up BMP @3am  -monitor vitals, could consider repeat EKG

## 2019-03-28 ENCOUNTER — Inpatient Hospital Stay (HOSPITAL_COMMUNITY): Payer: Medicare Other

## 2019-03-28 DIAGNOSIS — N186 End stage renal disease: Secondary | ICD-10-CM

## 2019-03-28 LAB — RENAL FUNCTION PANEL
Albumin: 3.2 g/dL — ABNORMAL LOW (ref 3.5–5.0)
Albumin: 3.3 g/dL — ABNORMAL LOW (ref 3.5–5.0)
Anion gap: 12 (ref 5–15)
Anion gap: 13 (ref 5–15)
BUN: 74 mg/dL — ABNORMAL HIGH (ref 6–20)
BUN: 73 mg/dL — ABNORMAL HIGH (ref 6–20)
CO2: 20 mmol/L — ABNORMAL LOW (ref 22–32)
CO2: 20 mmol/L — ABNORMAL LOW (ref 22–32)
Calcium: 8.3 mg/dL — ABNORMAL LOW (ref 8.9–10.3)
Calcium: 8.4 mg/dL — ABNORMAL LOW (ref 8.9–10.3)
Chloride: 89 mmol/L — ABNORMAL LOW (ref 98–111)
Chloride: 89 mmol/L — ABNORMAL LOW (ref 98–111)
Creatinine, Ser: 5.27 mg/dL — ABNORMAL HIGH (ref 0.61–1.24)
Creatinine, Ser: 5.34 mg/dL — ABNORMAL HIGH (ref 0.61–1.24)
GFR calc Af Amer: 15 mL/min — ABNORMAL LOW (ref >60)
GFR calc Af Amer: 15 mL/min — ABNORMAL LOW (ref >60)
GFR calc non Af Amer: 13 mL/min — ABNORMAL LOW (ref >60)
GFR calc non Af Amer: 13 mL/min — ABNORMAL LOW (ref >60)
Glucose, Bld: 86 mg/dL (ref 70–99)
Glucose, Bld: 93 mg/dL (ref 70–99)
Phosphorus: 4.9 mg/dL — ABNORMAL HIGH (ref 2.5–4.6)
Phosphorus: 5 mg/dL — ABNORMAL HIGH (ref 2.5–4.6)
Potassium: 4.9 mmol/L (ref 3.5–5.1)
Potassium: 4.6 mmol/L (ref 3.5–5.1)
Sodium: 121 mmol/L — ABNORMAL LOW (ref 135–145)
Sodium: 122 mmol/L — ABNORMAL LOW (ref 135–145)

## 2019-03-28 LAB — BASIC METABOLIC PANEL
Anion gap: 13 (ref 5–15)
BUN: 69 mg/dL — ABNORMAL HIGH (ref 6–20)
CO2: 20 mmol/L — ABNORMAL LOW (ref 22–32)
Calcium: 8.6 mg/dL — ABNORMAL LOW (ref 8.9–10.3)
Chloride: 89 mmol/L — ABNORMAL LOW (ref 98–111)
Creatinine, Ser: 5.21 mg/dL — ABNORMAL HIGH (ref 0.61–1.24)
GFR calc Af Amer: 15 mL/min — ABNORMAL LOW (ref >60)
GFR calc non Af Amer: 13 mL/min — ABNORMAL LOW (ref >60)
Glucose, Bld: 93 mg/dL (ref 70–99)
Potassium: 4.6 mmol/L (ref 3.5–5.1)
Sodium: 122 mmol/L — ABNORMAL LOW (ref 135–145)

## 2019-03-28 LAB — CBC
HCT: 24 % — ABNORMAL LOW (ref 39.0–52.0)
Hemoglobin: 8.4 g/dL — ABNORMAL LOW (ref 13.0–17.0)
MCH: 31 pg (ref 26.0–34.0)
MCHC: 35 g/dL (ref 30.0–36.0)
MCV: 88.6 fL (ref 80.0–100.0)
Platelets: 216 10*3/uL (ref 150–400)
RBC: 2.71 MIL/uL — ABNORMAL LOW (ref 4.22–5.81)
RDW: 12.6 % (ref 11.5–15.5)
WBC: 5 10*3/uL (ref 4.0–10.5)
nRBC: 0 % (ref 0.0–0.2)

## 2019-03-28 LAB — LEVETIRACETAM LEVEL: Levetiracetam Lvl: 89.7 ug/mL — ABNORMAL HIGH (ref 10.0–40.0)

## 2019-03-28 MED ORDER — LACOSAMIDE 50 MG PO TABS
200.0000 mg | ORAL_TABLET | Freq: Two times a day (BID) | ORAL | Status: DC
  Administered 2019-03-28 – 2019-03-30 (×4): 200 mg via ORAL
  Filled 2019-03-28 (×4): qty 4

## 2019-03-28 MED ORDER — MIRTAZAPINE 15 MG PO TABS
7.5000 mg | ORAL_TABLET | Freq: Every day | ORAL | Status: DC
  Administered 2019-03-28 – 2019-03-30 (×3): 7.5 mg via ORAL
  Filled 2019-03-28 (×3): qty 1

## 2019-03-28 MED ORDER — LEVETIRACETAM 500 MG PO TABS
500.0000 mg | ORAL_TABLET | Freq: Two times a day (BID) | ORAL | Status: DC
  Administered 2019-03-28 – 2019-03-31 (×6): 500 mg via ORAL
  Filled 2019-03-28 (×6): qty 1

## 2019-03-28 NOTE — Progress Notes (Signed)
@  2142 Paged Dr. Ronnald Ramp on-call for IMTS at pt's request to ask for restart of home dose of Remeron.  Page promptly returned and order received. Will administer and continue to monitor.

## 2019-03-28 NOTE — Consult Note (Addendum)
[facility] Progress Note   Subjective:   Feels some improvement but still only liquid oral intake due to nausea.  2 episodes of non bloody loose stool.  No new complaints. No H/A vision changes.  UOP 1.78L yesterday.  No ins documented.  Objective Vitals:   03/28/19 0700 03/28/19 0947 03/28/19 1100 03/28/19 1133  BP:  (!) 133/116  122/84  Pulse:  91  (!) 29  Resp: (!) 9  13 (!) 81  Temp:  97.9 F (36.6 C)  97.7 F (36.5 C)  TempSrc:  Oral  Oral  SpO2:  91%  92%  Weight:      Height:       Physical Exam General: lying flat in bed, appears much more comfortable Heart: RRR, no rub Lungs: normal WOB flat on RA Abdomen: soft, mild distended, nontender to palpation. LLQ renal transplant Extremities: trace ankle edema, edema has now moved centrally - 2+ pitting thigh, 1+ arms  Additional Objective Labs: Basic Metabolic Panel: Recent Labs  Lab 03/27/19 1940 03/27/19 2308 03/28/19 0247  NA 123* 121* 122*  K 5.3* 4.9 4.6  CL 92* 89* 89*  CO2 18* 20* 20*  GLUCOSE 85 86 93  BUN 77* 74* 73*  CREATININE 5.59* 5.27* 5.34*  CALCIUM 8.4* 8.3* 8.4*  PHOS 5.2* 4.9* 5.0*   Liver Function Tests: Recent Labs  Lab 03/26/19 1130  03/27/19 1940 03/27/19 2308 03/28/19 0247  AST 17  --   --   --   --   ALT 13  --   --   --   --   ALKPHOS 43  --   --   --   --   BILITOT 1.2  --   --   --   --   PROT 6.2*  --   --   --   --   ALBUMIN 3.8   < > 3.2* 3.2* 3.3*   < > = values in this interval not displayed.   Recent Labs  Lab 03/26/19 1130  LIPASE 33   CBC: Recent Labs  Lab 03/26/19 1130 03/26/19 1208 03/27/19 0321 03/28/19 0247  WBC 7.7  --  5.3 5.0  NEUTROABS 7.1  --   --   --   HGB 9.8* 9.2* 8.4* 8.4*  HCT 29.0* 27.0* 23.7* 24.0*  MCV 91.5  --  87.8 88.6  PLT 256  --  222 216   Blood Culture    Component Value Date/Time   SDES URINE, RANDOM 03/26/2019 1134   SPECREQUEST NONE 03/26/2019 1134   CULT  03/26/2019 1134    NO GROWTH Performed at  [facility], [place] [address], [place], [place] [address]    REPTSTATUS 03/27/2019 FINAL 03/26/2019 1134    Cardiac Enzymes: No results for input(s): CKTOTAL, CKMB, CKMBINDEX, TROPONINI in the last 168 hours. CBG: No results for input(s): GLUCAP in the last 168 hours. Iron Studies: No results for input(s): IRON, TIBC, TRANSFERRIN, FERRITIN in the last 72 hours. @lablastinr3 @ Studies/Results: Ct Abdomen Pelvis Wo Contrast  Result Date: 03/26/2019 CLINICAL DATA:  Left-sided abdominal pain and vomiting. Rectal bleeding. Diarrhea. Kidney transplant patient. EXAM: CT ABDOMEN AND PELVIS WITHOUT CONTRAST TECHNIQUE: Multidetector CT imaging of the abdomen and pelvis was performed following the standard protocol without IV contrast. COMPARISON:  None. FINDINGS: Lower chest: Small bilateral pleural effusions. 7 mm subpleural nodular opacity in the posterior right lower lobe on image 20/series 5. Hepatobiliary: No mass visualized on this unenhanced exam. Gallbladder is  unremarkable. Pancreas: No mass or inflammatory process visualized on this unenhanced exam. Spleen:  Within normal limits in size. Adrenals/Urinary tract: Previous bilateral nephrectomies. Normal appearance of renal transplant in left iliac fossa on this unenhanced exam. No evidence of hydronephrosis. Postop changes noted in the right iliac fossa, likely from previous renal transplant in this location. Unremarkable unopacified urinary bladder. Stomach/Bowel: No evidence of obstruction, inflammatory process, or abnormal fluid collections. Vascular/Lymphatic: No pathologically enlarged lymph nodes identified. No evidence of abdominal aortic aneurysm. Reproductive:  No mass or other significant abnormality. Other:  Diffuse mesenteric and body wall edema. Mild ascites. Musculoskeletal:  No suspicious bone lesions identified. IMPRESSION: 1. Small bilateral pleural effusions, mild ascites, and diffuse mesenteric and body wall edema, consistent with  third spacing. 2. No other acute findings identified. 3. Normal appearance of renal transplant in left iliac fossa. No evidence of hydronephrosis. Electronically Signed   By: Marlaine Hind M.D.   On: 03/26/2019 15:03   Ct Chest Wo Contrast  Result Date: 03/27/2019 CLINICAL DATA:  Follow-up chest x-ray results. Chronic dyspnea. EXAM: CT CHEST WITHOUT CONTRAST TECHNIQUE: Multidetector CT imaging of the chest was performed following the standard protocol without IV contrast. COMPARISON:  Chest x-ray dated 03/26/2019. FINDINGS: Cardiovascular: Heart size small pericardial effusion. Walls of the LEFT atrium are hyperdense, of uncertain significance, possibly related to calcium deposition. Mediastinum/Nodes: No mass or enlarged lymph nodes appreciated within the mediastinum or perihilar regions. Esophagus is unremarkable. Trachea appears normal. Lungs/Pleura: Moderate-sized pleural effusions bilaterally, with associated compressive atelectasis. Additional mild atelectasis within the LEFT upper lobe and RIGHT lower lobe. Lungs otherwise clear. Upper Abdomen: Moderate amount of free fluid, incompletely imaged. Otherwise limited exam of the upper abdomen. Musculoskeletal: No acute or suspicious osseous finding. Diffuse atherosclerotic calcifications of the small vessels of the chest wall, mediastinum and upper abdomen. IMPRESSION: 1. Moderate-sized pleural effusions bilaterally, with associated compressive atelectasis. Lungs are otherwise clear. No evidence of pneumonia or pulmonary edema. 2. Small pericardial effusion. 3. Walls of the LEFT atrium are hyperdense, of uncertain significance, likely related to calcium deposition given the clinical data of possible renal transplant and given the diffuse atherosclerotic calcifications of the small vessels of the chest wall, mediastinum and upper abdomen. 4. Moderate amount of free fluid within the upper abdomen, incompletely imaged. Electronically Signed   By: Franki Cabot  M.D.   On: 03/27/2019 19:54   US Renal Transplant W/doppler  Result Date: 03/28/2019 CLINICAL DATA:  Acute kidney injury and history of renal transplantation. EXAM: ULTRASOUND OF RENAL TRANSPLANT WITH RENAL DOPPLER ULTRASOUND TECHNIQUE: Ultrasound examination of the renal transplant was performed with gray-scale, color and duplex doppler evaluation. COMPARISON:  Prior ultrasound at [facility] on 02/07/2014 FINDINGS: Transplant kidney location: Left lower quadrant Transplant Kidney: Renal measurements: 11.6 x 6.4 x 5.6 cm = volume: 25mL. Cortex of the transplant kidney appears intact in thickness. Echogenicity of the cortex is mildly increased. There is mild transplant hydronephrosis. Some adjacent free fluid appears to be related to ascites in the peritoneal cavity. No renal masses or visible shadowing calculi. Tiny cortical cyst of the lower pole measures 0.5 cm. Color flow in the main renal artery:  Yes Color flow in the main renal vein:  Yes Duplex Doppler Evaluation: Main Renal Artery Resistive Index: 0.62 Venous waveform in main renal vein:  Present. Intrarenal resistive index in upper pole:  0.52 (normal 0.6-0.8; equivocal 0.8-0.9; abnormal >= 0.9) Intrarenal resistive index in lower pole: 0.64 (normal 0.6-0.8; equivocal 0.8-0.9; abnormal >= 0.9) Bladder: Normal  for degree of bladder distention. Other findings:  None. IMPRESSION: 1. Mild increased cortical echogenicity of transplant kidney in the left lower quadrant with mild transplant hydronephrosis. 2. Normal arterial resistance indices and waveforms at the level of the transplant artery and within the renal transplant. 3. Patent transplant renal vein. 4. Free fluid adjacent to the renal transplant appears to be related to ascites in the peritoneal cavity. Electronically Signed   By: Aletta Edouard M.D.   On: 03/28/2019 08:57   Dg Chest Port 1 View  Result Date: 03/26/2019 CLINICAL DATA:  Vomiting and shortness of breath. EXAM: PORTABLE CHEST 1 VIEW  COMPARISON:  July 11, 2013 FINDINGS: Cardiomediastinal silhouette is normal. Mediastinal contours appear intact. Diffusely increased interstitial markings. Multiple less than a cm nodular opacities are seen throughout both lungs with central predominance. Osseous structures are without acute abnormality. Soft tissues are grossly normal. IMPRESSION: Diffusely increased interstitial markings with multiple less than a cm nodular opacities throughout both lungs. These findings may represent mixed pattern pulmonary edema or atypical pneumonia. Electronically Signed   By: Fidela Salisbury M.D.   On: 03/26/2019 12:26   Medications:  . apixaban  2.5 mg Oral BID  . azaTHIOprine  50 mg Oral Daily  . cloNIDine  0.3 mg Oral TID  . cycloSPORINE modified  75 mg Oral BID  . insulin aspart  10 Units Intravenous Once   And  . dextrose  1 ampule Intravenous Once  . dicyclomine  10 mg Oral TID AC & HS  . divalproex  500 mg Oral BID  . lacosamide  250 mg Oral BID  . lamoTRIgine  50 mg Oral Daily  . leflunomide  20 mg Oral Daily  . levETIRAcetam  1,000 mg Oral BID  . lipase/protease/amylase  48,000 Units Oral TID WC  . pantoprazole  40 mg Oral BID  . predniSONE  10 mg Oral Daily  . sodium bicarbonate  1,950 mg Oral TID  . sodium chloride flush  3 mL Intravenous Q12H  . tamsulosin  0.4 mg Oral Daily     Assessment/Plan: **chronic abdominal pain + diarrhea:  Has had extensive evaluations and etiology typically attributed to chronic pancreatitis.  Imaging with CT here unrevealing.  In setting of acute worsening in transplant immunosuppressed host r/o infectious etiologies needed.  C diff negative, GI pathogen panel pending.  CMV VL pending.   Agree with primary teams continuation of home meds and consideration for testicular US.  Overall appears much more comfortable.  **hyponatremia:  Appears hypervolemic on exam with pitting edema and report of 25lb wt gain + has h/o SIADH and has improved with fluid  restriction + diuresis in the past.  Rec'd fluids yesterday followed by 1 dose lasix 7/12 PM and 1.5L fluid restriction.  Na improved from 122 to 126 initially but then through course of day yesterday and into this morning trended down to 122 again.  Urine osm 215 which is inconsistent with SIADH -- it appears this may be more consistent with low solute intake and continued consumption of hypotonic fluids.  He is not taking any real nutrition in.  Says cannot tolerate protein drinks due to diarrhea.  Asked he take zofran for nausea today and try to eat more substantial meal.  For now in setting of his volume overload would maintain fluid restriction.  Check repeat BMP at 2pm, will follow up.   **ESRD s/p renal transplant x 2:  Baseline Cr lately around mid 3s, some as high as 4s.  Biopsy a few weeks ago without acute rejection.  Now with AKI Cr in the mid 5s this admission - stable but not improved.  UA not suggestive of pyelonephritis.  Says compliant with medications.  Renal transplant US shows mild hydronephrosis - has h/o mild retention; check PVRs and I/O cath if > 252mL.  No plans for renal biopsy but will follow. Per chart has been relisted for another transplant.   Cont home cysA, azathioprine, prednisone.  CysA level pending.  **HTN:   Well controlled  Currently.   **Anemia:  Hb 8s- similar to recents at [facility]. Follow.   **Abnormal CXR: CT chest with effusions and ascites but no infiltrates.   Addendum:  F/u afternoon Na 122 still -- no changes for now --> use antiemetics to treat nausea and promote oral intake, hopefully some protein and solute intake will help.  Maintain fluid restriction.  Very challenging case.   Jannifer Hick MD 03/28/2019, 12:08 PM  [facility] Pager: [phone]

## 2019-03-28 NOTE — Progress Notes (Signed)
Subjective: John Cline is stable today. He reports continued pain, dissimilar to prior episodes of pancreatitis and similar to the pain he has been told is secondary to scar tissue from his kidney transplants. He reports continued nausea and we encouraged use of prn antiemetics. He is able to drink fluids but too nauseated to eat at the moment. He reports marijuana use and we discussed that that could possibly be contributing to his nausea and he was amenable to stopping to see if that helped. He has no other complaints or concerns.  Consults: nephrology on board  Objective:  Vital signs in last 24 hours: Vitals:   03/27/19 1955 03/28/19 0036 03/28/19 0428 03/28/19 0500  BP: (!) 134/96 (!) 133/100 (!) 131/96   Pulse: 94 71 75   Resp: 17 10 14    Temp: 98.1 F (36.7 C) 97.8 F (36.6 C) 98.1 F (36.7 C)   TempSrc: Oral Oral Oral   SpO2: 95% 98% 96%   Weight:    82.1 kg  Height:       Physical Exam  Constitutional: He is oriented to person, place, and time.  HENT:  Head: Normocephalic and atraumatic.  Cardiovascular: Intact distal pulses.  irregularly irregular rhythm; edematous extremities  Pulmonary/Chest: Effort normal and breath sounds normal. No respiratory distress. He has no wheezes.  Abdominal: Soft. Bowel sounds are normal. He exhibits no distension. There is abdominal tenderness.  Well healed scar LLQ from kidney transplant  Neurological: He is alert and oriented to person, place, and time.  Skin: Skin is warm and dry. No erythema.  Nursing note and vitals reviewed.  IOs: 1776 ml out yesterday  Labs:  CMP Latest Ref Rng & Units 03/28/2019 03/27/2019 03/27/2019  Glucose 70 - 99 mg/dL 93 86 85  BUN 6 - 20 mg/dL 73(H) 74(H) 77(H)  Creatinine 0.61 - 1.24 mg/dL 5.34(H) 5.27(H) 5.59(H)  Sodium 135 - 145 mmol/L 122(L) 121(L) 123(L)  Potassium 3.5 - 5.1 mmol/L 4.6 4.9 5.3(H)  Chloride 98 - 111 mmol/L 89(L) 89(L) 92(L)  CO2 22 - 32 mmol/L 20(L) 20(L) 18(L)  Calcium 8.9 -  10.3 mg/dL 8.4(L) 8.3(L) 8.4(L)  Total Protein 6.5 - 8.1 g/dL - - -  Total Bilirubin 0.3 - 1.2 mg/dL - - -  Alkaline Phos 38 - 126 U/L - - -  AST 15 - 41 U/L - - -  ALT 0 - 44 U/L - - -   CBC Latest Ref Rng & Units 03/28/2019 03/27/2019 03/26/2019  WBC 4.0 - 10.5 K/uL 5.0 5.3 -  Hemoglobin 13.0 - 17.0 g/dL 8.4(L) 8.4(L) 9.2(L)  Hematocrit 39.0 - 52.0 % 24.0(L) 23.7(L) 27.0(L)  Platelets 150 - 400 K/uL 216 222 -   Imaging: US Renal Transplant with Doppler: 1. Mild increased cortical echogenicity of transplant kidney in the LLQ with mild hydro 2. Normal arterial resistance indices and waveforms at the level of the transplant artery and within the transplant 3. Patent transplant renal vein 4. 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   Plan: Active Problems:    Abdominal pain -  patient pain has improved somewhat with sx pain relief with bentyl, tylenol, PPI, anti emetics and prn dilaudid. Continues to feel nauseated and encouraged to take prn antiemetics  -reports stabbing LUQ pain that radiates to his left groin and testicle which is stable and similar to prior episodes which have been contributed to scar tissue -CT abdomen was unremarkable and showed no hydronephrosis -no emesis or BMs noted today but patient unable to eat secondary to nausea -continue medications for sx control  -fu nephrology recs    Hyponatremia -Na of 121 on presentation, now 122 with fluid restriction, no lasix since night of admission per  nephrology recs  -hx of hyponatremia, likely SIADH/renal insufficiency -pt still edematous on exam -fu with RFTs  -fu nephro recs    ESRD s/p transplant: -continue home regimen of cyclosporine, azathioprine and prednisone -creatinine 5.34 from 5.5 on admission and 3.5 10 days ago -continue following creatinine with RFTs -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, could be continued rejection or worsening -renal transplant [place] w/ doppler showed mild increased cortical echogenicity of transplant kidney in LLW w/ mild hydro; normal arterial resistance indices and waveforms at level of transplant artery and within renal transplant; patent transplant renal vein; free fluid adjacent to the renal transplant related to ascites in the peritoneal cavity -nephrology consulted, appreciate recs    Atrial Fibrillation/Flutter: -hold coreg and dilt 180 mg due to bradycardia  -continue apixaban 2.5 mg BID    Hypertension: -continue clonidine 0.3 mg TID -amlodipine, lasix and spironolactone held due to soft bps on admission    Seizure Disorder: -continue home meds for now -concern from pharmacy about actual medication regimen, will review and readjust   Dispo: Anticipated discharge in approximately in 3-4 days.  Al Decant, MD 03/28/2019, 6:18 AM Pager: [phone]

## 2019-03-28 NOTE — Progress Notes (Signed)
Bladder scan completed - result 322 ml.

## 2019-03-28 NOTE — Progress Notes (Signed)
  Date: 03/28/2019  Patient name: John Cline.  Medical record number: [id number]  Date of birth: 1986-05-02   I have seen and evaluated this patient and I have discussed the plan of care with the house staff. Please see their note for complete details. I concur with their findings with the following additions/corrections: Mr. Mcinroy was seen this morning on team rounds.  He states no significant improvement in his abdominal pain, vomiting, or diarrhea.  No etiology has been found.  His acute GI pathogens panel was negative.  CMV is pending.  I agree with Dr. Madilyn Fireman that he should stop marijuana to see if this improves his symptoms although I doubt his symptoms are only attributable to marijuana at 68 exacerbating his underlying issues.   His hyponatremia is not significantly improving.  Greatly appreciate nephrology's assistance.  Nephrology does not feel this is consistent with SIADH but rather low solute intake with hypotonic fluids.  His weight was 63.6 kg in February of this year, 79.2 kg on admission, and 82.1 kg today.  He has gained almost 20 kg since February.  He has been encouraged to try to increase his oral intake.  His creatinine is stable but not downwards trending.  Follow for now.  [name], MD 03/28/2019, 4:18 PM

## 2019-03-29 LAB — RENAL FUNCTION PANEL
Albumin: 3.3 g/dL — ABNORMAL LOW (ref 3.5–5.0)
Anion gap: 13 (ref 5–15)
BUN: 65 mg/dL — ABNORMAL HIGH (ref 6–20)
CO2: 21 mmol/L — ABNORMAL LOW (ref 22–32)
Calcium: 8.6 mg/dL — ABNORMAL LOW (ref 8.9–10.3)
Chloride: 89 mmol/L — ABNORMAL LOW (ref 98–111)
Creatinine, Ser: 4.82 mg/dL — ABNORMAL HIGH (ref 0.61–1.24)
GFR calc Af Amer: 17 mL/min — ABNORMAL LOW (ref >60)
GFR calc non Af Amer: 14 mL/min — ABNORMAL LOW (ref >60)
Glucose, Bld: 80 mg/dL (ref 70–99)
Phosphorus: 4.5 mg/dL (ref 2.5–4.6)
Potassium: 4.5 mmol/L (ref 3.5–5.1)
Sodium: 123 mmol/L — ABNORMAL LOW (ref 135–145)

## 2019-03-29 MED ORDER — FUROSEMIDE 10 MG/ML IJ SOLN
80.0000 mg | Freq: Once | INTRAMUSCULAR | Status: AC
  Administered 2019-03-29: 80 mg via INTRAVENOUS
  Filled 2019-03-29: qty 8

## 2019-03-29 MED ORDER — ORAL CARE MOUTH RINSE
15.0000 mL | Freq: Two times a day (BID) | OROMUCOSAL | Status: DC
  Administered 2019-03-29 – 2019-03-30 (×2): 15 mL via OROMUCOSAL

## 2019-03-29 MED ORDER — PREDNISONE 10 MG PO TABS
10.0000 mg | ORAL_TABLET | Freq: Every day | ORAL | Status: DC
  Administered 2019-03-29 – 2019-03-31 (×3): 10 mg via ORAL
  Filled 2019-03-29 (×3): qty 1

## 2019-03-29 MED ORDER — TAMSULOSIN HCL 0.4 MG PO CAPS
0.4000 mg | ORAL_CAPSULE | Freq: Every day | ORAL | Status: DC
  Administered 2019-03-29 – 2019-03-31 (×3): 0.4 mg via ORAL
  Filled 2019-03-29 (×3): qty 1

## 2019-03-29 MED ORDER — LEFLUNOMIDE 20 MG PO TABS
20.0000 mg | ORAL_TABLET | Freq: Every day | ORAL | Status: DC
  Administered 2019-03-29 – 2019-03-31 (×3): 20 mg via ORAL
  Filled 2019-03-29 (×3): qty 1

## 2019-03-29 MED ORDER — CHLORHEXIDINE GLUCONATE 0.12 % MT SOLN
15.0000 mL | Freq: Two times a day (BID) | OROMUCOSAL | Status: DC
  Administered 2019-03-29 – 2019-03-31 (×5): 15 mL via OROMUCOSAL
  Filled 2019-03-29 (×5): qty 15

## 2019-03-29 MED ORDER — PRO-STAT SUGAR FREE PO LIQD
30.0000 mL | Freq: Three times a day (TID) | ORAL | Status: DC
  Administered 2019-03-29 – 2019-03-31 (×7): 30 mL via ORAL
  Filled 2019-03-29 (×7): qty 30

## 2019-03-29 MED ORDER — DILTIAZEM HCL 60 MG PO TABS
30.0000 mg | ORAL_TABLET | Freq: Four times a day (QID) | ORAL | Status: DC
  Administered 2019-03-29 – 2019-03-30 (×4): 30 mg via ORAL
  Filled 2019-03-29 (×4): qty 1

## 2019-03-29 MED ORDER — AZATHIOPRINE 50 MG PO TABS
50.0000 mg | ORAL_TABLET | Freq: Every day | ORAL | Status: DC
  Administered 2019-03-29 – 2019-03-31 (×3): 50 mg via ORAL
  Filled 2019-03-29 (×3): qty 1

## 2019-03-29 MED ORDER — LAMOTRIGINE 25 MG PO TABS
50.0000 mg | ORAL_TABLET | Freq: Every day | ORAL | Status: DC
  Administered 2019-03-29 – 2019-03-31 (×3): 50 mg via ORAL
  Filled 2019-03-29 (×3): qty 2

## 2019-03-29 NOTE — Progress Notes (Signed)
Subjective: [name] is stable today. He reports continued pain and nausea for which we again encouraged use of prn antiemetics. He is amenable to trying to eat today with plan to get zofran prior. We discussed that his pain is unfortunately chronic in nature and he may always have some at baseline although we don't want it so severe it intereferes with his daily life. But we wanted him to keep that in mind as we discussed his pain regimen and the fact that we cannot send him out on his current pain regimen. He has no other complaints or concerns.  Consults: nephrology on board  Objective:  Vital signs in last 24 hours: Vitals:   03/28/19 2347 03/29/19 0500 03/29/19 0530 03/29/19 0857  BP: (!) 147/102  (!) 138/99 (!) 152/97  Pulse: (!) 113  (!) 42   Resp: 12  (!) 7 11  Temp: 97.8 F (36.6 C)  98.6 F (37 C) 98.1 F (36.7 C)  TempSrc: Oral  Oral Oral  SpO2: 94%  92% 93%  Weight:  79.9 kg    Height:       Physical Exam  Constitutional: He is oriented to person, place, and time. No distress.  HENT:  Head: Normocephalic and atraumatic.  Cardiovascular: Intact distal pulses.  irregularly irregular rhythm; edematous extremities  Pulmonary/Chest: Effort normal and breath sounds normal. No respiratory distress. He has no wheezes.  Abdominal: Soft. Bowel sounds are normal. He exhibits no distension. There is abdominal tenderness.  Well healed scar LLQ from kidney transplant  Neurological: He is alert and oriented to person, place, and time.  Skin: He is not diaphoretic. No erythema.  Left arm leaking and surrounding bedding wet   Nursing note and vitals reviewed.  IOs: 600 ml out yesterday, 450 out so far today   Labs:  CMP Latest Ref Rng & Units 03/29/2019 03/28/2019 03/28/2019  Glucose 70 - 99 mg/dL 80 93 93  BUN 6 - 20 mg/dL 65(H) 69(H) 73(H)  Creatinine 0.61 - 1.24 mg/dL 4.82(H) 5.21(H) 5.34(H)  Sodium 135 - 145 mmol/L 123(L) 122(L) 122(L)  Potassium 3.5 - 5.1 mmol/L 4.5 4.6  4.6  Chloride 98 - 111 mmol/L 89(L) 89(L) 89(L)  CO2 22 - 32 mmol/L 21(L) 20(L) 20(L)  Calcium 8.9 - 10.3 mg/dL 8.6(L) 8.6(L) 8.4(L)  Total Protein 6.5 - 8.1 g/dL - - -  Total Bilirubin 0.3 - 1.2 mg/dL - - -  Alkaline Phos 38 - 126 U/L - - -  AST 15 - 41 U/L - - -  ALT 0 - 44 U/L - - -   CBC Latest Ref Rng & Units 03/28/2019 03/27/2019 03/26/2019  WBC 4.0 - 10.5 K/uL 5.0 5.3 -  Hemoglobin 13.0 - 17.0 g/dL 8.4(L) 8.4(L) 9.2(L)  Hematocrit 39.0 - 52.0 % 24.0(L) 23.7(L) 27.0(L)  Platelets 150 - 400 K/uL 216 222 -   Imaging: US Renal Transplant with Doppler: 1. Mild increased cortical echogenicity of transplant kidney in the LLQ with mild hydro 2. Normal arterial resistance indices and waveforms at the level of the transplant artery and within the transplant 3. Patent transplant renal vein 4. 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   Plan: Active Problems:    Abdominal pain -patient pain has improved somewhat with sx pain relief with bentyl, tylenol, PPI, anti emetics and prn dilaudid. Continues to feel nauseated and encouraged to take prn antiemetics  -reports continued pain attributable to known scar tissue -CT abdomen was unremarkable and showed no hydronephrosis -no emesis or BMs noted today but patient still has not eaten; encouraged prn with zofran prior -continue medications for sx control  -fu nephrology recs    Hyponatremia -Na of 121 on presentation, now 122 with fluid restriction, no lasix since night  of admission per nephrology recs  -hx of hyponatremia, neprhology thinks secondary to low solute intake and continued consumption of hypotonic fluids  -pt still edematous on exam -per neprho, continue fluid restriction and encourage zofran and po  -fu with RFTs  -fu nephro recs    ESRD s/p transplant: -continue home regimen of cyclosporine, azathioprine and prednisone -creatinine 5.34 from 5.5 on admission and 3.5 10 days ago -continue following creatinine with RFTs -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, could be continued rejection or worsening -renal transplant [place] w/ doppler showed mild increased cortical echogenicity of transplant kidney in LLW w/ mild hydro; normal arterial resistance indices and waveforms at level of transplant artery and within renal transplant; patent transplant renal vein; free fluid adjacent to the renal transplant related to ascites in the peritoneal cavity -nephrology consulted, appreciate recs    Atrial Fibrillation/Flutter: -hold coreg and dilt 180 mg due to bradycardia  -continue apixaban 2.5 mg BID    Hypertension: -continue clonidine 0.3 mg TID -amlodipine, lasix and spironolactone held due to soft bps on admission    Seizure Disorder: -continue home meds for now at renally adjusted doses   Dispo: Anticipated discharge in approximately in 3-4 days.  Al Decant, MD 03/29/2019, 11:24 AM Pager: [phone]

## 2019-03-29 NOTE — Progress Notes (Signed)
  Date: 03/29/2019  Patient name: John Cline.  Medical record number: [id number]  Date of birth: [birth year]   I have seen and evaluated this patient and I have discussed the plan of care with the house staff. Please see their note for complete details. I concur with their findings with the following additions/corrections: [name] was seen this AM on team rounds. Subjectively, he states his pain is no better but objectively  he looks to be in less distress today and is tolerating auscultation of his abdomen.  The etiology of his acute on chronic abdominal pain is not clear and is likely multifactorial.  I am hesitant to add any SNRIs as I am not convinced it will significantly decrease his pain, increase his medication burden, and increased chances of interactions.  We will continue symptomatic treatment and only investigate further with new findings.  He is having seepage of fluid from his left upper extremity.  He states he is having edema of his face which is causing congestion.  Sodium is unchanged nephrology is trying 1 dose of IV Lasix today.  [name], MD 03/29/2019, 2:38 PM

## 2019-03-29 NOTE — Progress Notes (Signed)
[facility] Progress Note   Subjective:  Still c/o abd pain.  On clear liquid diet - requesting change.  UOP 1.8L yesterday.  Intake 1.2L.   Objective Vitals:   03/29/19 0500 03/29/19 0530 03/29/19 0857 03/29/19 1200  BP:  (!) 138/99 (!) 152/97 (!) 145/99  Pulse:  (!) 42    Resp:  (!) 7 11 (!) 7  Temp:  98.6 F (37 C) 98.1 F (36.7 C) 99.1 F (37.3 C)  TempSrc:  Oral Oral Oral  SpO2:  92% 93% 93%  Weight: 79.9 kg     Height:       Physical Exam General: lying flat in bed, appears much more comfortable Heart: RRR, no rub Lungs: normal WOB flat on RA Abdomen: soft, mild distended, nontender to palpation. LLQ renal transplant Extremities: trace ankle edema, edema has now moved centrally - 2+ pitting thigh, 1+ arms  Additional Objective Labs: Basic Metabolic Panel: Recent Labs  Lab 03/27/19 2308 03/28/19 0247 03/28/19 1449 03/29/19 0408  NA 121* 122* 122* 123*  K 4.9 4.6 4.6 4.5  CL 89* 89* 89* 89*  CO2 20* 20* 20* 21*  GLUCOSE 86 93 93 80  BUN 74* 73* 69* 65*  CREATININE 5.27* 5.34* 5.21* 4.82*  CALCIUM 8.3* 8.4* 8.6* 8.6*  PHOS 4.9* 5.0*  --  4.5   Liver Function Tests: Recent Labs  Lab 03/26/19 1130  03/27/19 2308 03/28/19 0247 03/29/19 0408  AST 17  --   --   --   --   ALT 13  --   --   --   --   ALKPHOS 43  --   --   --   --   BILITOT 1.2  --   --   --   --   PROT 6.2*  --   --   --   --   ALBUMIN 3.8   < > 3.2* 3.3* 3.3*   < > = values in this interval not displayed.   Recent Labs  Lab 03/26/19 1130  LIPASE 33   CBC: Recent Labs  Lab 03/26/19 1130 03/26/19 1208 03/27/19 0321 03/28/19 0247  WBC 7.7  --  5.3 5.0  NEUTROABS 7.1  --   --   --   HGB 9.8* 9.2* 8.4* 8.4*  HCT 29.0* 27.0* 23.7* 24.0*  MCV 91.5  --  87.8 88.6  PLT 256  --  222 216   Blood Culture    Component Value Date/Time   SDES BLOOD RIGHT HAND 03/27/2019 0722   SPECREQUEST AEROBIC BOTTLE ONLY Blood Culture adequate volume 03/27/2019 0722   CULT   03/27/2019 0722    NO GROWTH 1 DAY Performed at [facility], [place] [address] [place], [place] [address]    REPTSTATUS PENDING 03/27/2019 1856    Cardiac Enzymes: No results for input(s): CKTOTAL, CKMB, CKMBINDEX, TROPONINI in the last 168 hours. CBG: No results for input(s): GLUCAP in the last 168 hours. Iron Studies: No results for input(s): IRON, TIBC, TRANSFERRIN, FERRITIN in the last 72 hours. @lablastinr3 @ Studies/Results: Ct Chest Wo Contrast  Result Date: 03/27/2019 CLINICAL DATA:  Follow-up chest x-ray results. Chronic dyspnea. EXAM: CT CHEST WITHOUT CONTRAST TECHNIQUE: Multidetector CT imaging of the chest was performed following the standard protocol without IV contrast. COMPARISON:  Chest x-ray dated 03/26/2019. FINDINGS: Cardiovascular: Heart size small pericardial effusion. Walls of the LEFT atrium are hyperdense, of uncertain significance, possibly related to calcium deposition. Mediastinum/Nodes: No mass or enlarged lymph nodes  appreciated within the mediastinum or perihilar regions. Esophagus is unremarkable. Trachea appears normal. Lungs/Pleura: Moderate-sized pleural effusions bilaterally, with associated compressive atelectasis. Additional mild atelectasis within the LEFT upper lobe and RIGHT lower lobe. Lungs otherwise clear. Upper Abdomen: Moderate amount of free fluid, incompletely imaged. Otherwise limited exam of the upper abdomen. Musculoskeletal: No acute or suspicious osseous finding. Diffuse atherosclerotic calcifications of the small vessels of the chest wall, mediastinum and upper abdomen. IMPRESSION: 1. Moderate-sized pleural effusions bilaterally, with associated compressive atelectasis. Lungs are otherwise clear. No evidence of pneumonia or pulmonary edema. 2. Small pericardial effusion. 3. Walls of the LEFT atrium are hyperdense, of uncertain significance, likely related to calcium deposition given the clinical data of possible renal transplant and given the  diffuse atherosclerotic calcifications of the small vessels of the chest wall, mediastinum and upper abdomen. 4. Moderate amount of free fluid within the upper abdomen, incompletely imaged. Electronically Signed   By: Franki Cabot M.D.   On: 03/27/2019 19:54   US Renal Transplant W/doppler  Result Date: 03/28/2019 CLINICAL DATA:  Acute kidney injury and history of renal transplantation. EXAM: ULTRASOUND OF RENAL TRANSPLANT WITH RENAL DOPPLER ULTRASOUND TECHNIQUE: Ultrasound examination of the renal transplant was performed with gray-scale, color and duplex doppler evaluation. COMPARISON:  Prior ultrasound at [facility] on 02/07/2014 FINDINGS: Transplant kidney location: Left lower quadrant Transplant Kidney: Renal measurements: 11.6 x 6.4 x 5.6 cm = volume: 265mL. Cortex of the transplant kidney appears intact in thickness. Echogenicity of the cortex is mildly increased. There is mild transplant hydronephrosis. Some adjacent free fluid appears to be related to ascites in the peritoneal cavity. No renal masses or visible shadowing calculi. Tiny cortical cyst of the lower pole measures 0.5 cm. Color flow in the main renal artery:  Yes Color flow in the main renal vein:  Yes Duplex Doppler Evaluation: Main Renal Artery Resistive Index: 0.62 Venous waveform in main renal vein:  Present. Intrarenal resistive index in upper pole:  0.52 (normal 0.6-0.8; equivocal 0.8-0.9; abnormal >= 0.9) Intrarenal resistive index in lower pole: 0.64 (normal 0.6-0.8; equivocal 0.8-0.9; abnormal >= 0.9) Bladder: Normal for degree of bladder distention. Other findings:  None. IMPRESSION: 1. Mild increased cortical echogenicity of transplant kidney in the left lower quadrant with mild transplant hydronephrosis. 2. Normal arterial resistance indices and waveforms at the level of the transplant artery and within the renal transplant. 3. Patent transplant renal vein. 4. Free fluid adjacent to the renal transplant appears to be related to ascites  in the peritoneal cavity. Electronically Signed   By: Aletta Edouard M.D.   On: 03/28/2019 08:57   Medications:  . apixaban  2.5 mg Oral BID  . azaTHIOprine  50 mg Oral Daily  . chlorhexidine  15 mL Mouth Rinse BID  . cloNIDine  0.3 mg Oral TID  . cycloSPORINE modified  75 mg Oral BID  . insulin aspart  10 Units Intravenous Once   And  . dextrose  1 ampule Intravenous Once  . dicyclomine  10 mg Oral TID AC & HS  . divalproex  500 mg Oral BID  . lacosamide  200 mg Oral BID  . lamoTRIgine  50 mg Oral Daily  . leflunomide  20 mg Oral Daily  . levETIRAcetam  500 mg Oral BID  . lipase/protease/amylase  48,000 Units Oral TID WC  . mouth rinse  15 mL Mouth Rinse q12n4p  . mirtazapine  7.5 mg Oral QHS  . pantoprazole  40 mg Oral BID  . predniSONE  10 mg Oral Daily  . sodium bicarbonate  1,950 mg Oral TID  . sodium chloride flush  3 mL Intravenous Q12H  . tamsulosin  0.4 mg Oral Daily     Assessment/Plan: **chronic abdominal pain + diarrhea:  Has had extensive evaluations and etiology typically attributed to chronic pancreatitis.  Imaging with CT here unrevealing.  In setting of acute worsening in transplant immunosuppressed host r/o infectious etiologies needed.  C diff negative, GI pathogen panel negative.  CMV VL pending.   Agree with primary teams plan and agree with marijuana cessation.   **hyponatremia:  Has h/o SIADH and hypervolemic hyponatremia which has improved with fluid restriction and diuresis.  This admission his urine osm are on low side and along with hypoalbuminemia I think this is at least in part related to poor intake.  Na 123 this AM on 1523mL fluid restriction with plan for antiemetic and increased oral intake of protein containing food.  Discussed again - change to 2g sodium diet with 1560mL fluid restriction.   I am going to give 1 dose of IV lasix 80mg  given so edematous and he's in agreement.   This is certainly a challenging situation.   **ESRD s/p renal  transplant x 2:  Baseline Cr lately around mid 3s, some as high as 4s.  Biopsy a few weeks ago without acute rejection.  Now with AKI Cr in the mid 5s this admission but into the 4.8s today.   UA not suggestive of pyelonephritis.  Says compliant with medications.  Renal transplant US shows mild hydronephrosis - has h/o mild retention; check PVRs and I/O cath if > 227mL.  No plans for renal biopsy but will follow. Per chart has been relisted for another transplant.  Cont home cysA, azathioprine, prednisone.  CysA level pending.  **HTN:   BP creeping up.  On clonidine 0.3 TID, other home meds on hold -- give IV lasix today so hold on resuming other meds.   **Anemia:  Hb 8s- similar to recents at [facility]. Follow. Check iron indices, consider ESA.   **Abnormal CXR: CT chest with effusions and ascites but no infiltrates.   **Seizure DO: on home meds  **A fib/flutter:  Coreg and diltiazem on hold for bradycardia; on apixiban 2.5 BID.  HR now in the 70-90s. Resume diltiazem but use 30q6h for now.   Jannifer Hick MD 03/29/2019, 12:13 PM  [facility] Pager: [phone]

## 2019-03-29 NOTE — Progress Notes (Signed)
Initial Nutrition Assessment  DOCUMENTATION CODES:   Not applicable  INTERVENTION:   -30 ml Prostat TID, each supplement provides 100 kcals and 15 grams protein.  -Once diet advanced: Ensure Enlive po BID, each supplement provides 350 kcal and 20 grams of protein  NUTRITION DIAGNOSIS:   Inadequate oral intake related to nausea, vomiting as evidenced by meal completion < 50%.  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 Presents this admission with abdominal pain/vomiting and hyponatremia.   Spoke with pt at bedside. Reports appetite has been inconsistent for the last couple of months due to chronic abdominal pain/diarrhea. States some days he consume two meals and other days three. Meals consist of eggs for breakfast, a deli sandwich for lunch, and meat with grain/vegetable for dinner. He does not use supplementation at home. Meal completion charted as 25% for his last meal. Discussed the importance of protein intake for preservation of lean body mass while on clear liquid diet. Pt would like to have something with less volume as nausea continues.   Pt endorses a UBW of 130 lb and reports an unknown amount of weight gain likely from fluid accumulation. Records indicate pt weighed 145 lb in 06/06/18 and 174 lb this admission.   I/O: -2,126 ml since admit UOP: 1,825 ml x 24 hrs   Medications: SS novolog, creon 48,000 with meals, remeron, prednisone, 1,950 mg sodium bicarb TID Labs: Na 123 (L) Cr 4.82- trending down   NUTRITION - FOCUSED PHYSICAL EXAM:    Most Recent Value  Orbital Region  No depletion  Upper Arm Region  No depletion   Thoracic and Lumbar Region  Unable to assess  Buccal Region  No depletion  Temple Region  No depletion  Clavicle Bone Region  No depletion  Clavicle and Acromion Bone Region  No depletion  Scapular Bone Region  Unable to assess  Dorsal Hand  No depletion  Patellar Region  Unable to assess  Anterior Thigh Region  Unable to assess  Posterior Calf Region  Unable to assess  Edema (RD Assessment)  Unable to assess  Hair  Unable to assess  Eyes  Unable to assess  Mouth  Unable to assess  Skin  Unable to assess  Nails  Unable to assess     Diet Order:   Diet Order            Diet clear liquid Room service appropriate? Yes; Fluid consistency: Thin; Fluid restriction: 1500 mL Fluid  Diet effective now              EDUCATION NEEDS:   Education needs have been addressed  Skin:  Skin Assessment: Reviewed RN Assessment  Last BM:  7/13  Height:   Ht Readings from Last 1 Encounters:  03/26/19 5\' 7"  (1.702 m)    Weight:   Wt Readings from Last 1 Encounters:  03/29/19 79.9 kg    Ideal Body Weight:  67.3 kg  BMI:  Body mass index is 27.6 kg/m.  Estimated Nutritional Needs:   Kcal:  1950-2150 kcal  Protein:  95-115 grams  Fluid:  1.5 L fluid restriction  Mariana Single RD, LDN Clinical Nutrition Pager # -  [phone]  

## 2019-03-29 NOTE — Progress Notes (Signed)
Had a lengthy conversation w/ pt related to cleanliness and personal hygiene. Explained importance of infection prevention related to daily personal care. Pt once again declined bed bath or sitting on edge of bed w/ assistance or even a CHG bath. Finally pt agree to receiving assistance w/ bath on Thursday at 11am per his request.----JM

## 2019-03-29 NOTE — Plan of Care (Signed)
  Problem: Nutrition: Goal: Adequate nutrition will be maintained Outcome: Not Progressing   

## 2019-03-29 NOTE — Progress Notes (Signed)
Tech offered pt bath x2 but refused. When tech asked why he doesn't want to bathe he stated," I just don't feel like getting out of bed." Tech educated on the importance of bathing to decrease germs and keeping proper hygiene while in the hospital. Pt understood but still refused. Reported to RN and Therapist, sports.

## 2019-03-29 NOTE — Care Management Important Message (Signed)
Important Message  Patient Details  Name: John Cline. MRN: [id number] Date of Birth: [birth year]   Medicare Important Message Given:  Yes     Shelda Altes 03/29/2019, 12:57 PM

## 2019-03-30 LAB — RENAL FUNCTION PANEL
Albumin: 3.2 g/dL — ABNORMAL LOW (ref 3.5–5.0)
Albumin: 3.3 g/dL — ABNORMAL LOW (ref 3.5–5.0)
Anion gap: 11 (ref 5–15)
Anion gap: 13 (ref 5–15)
BUN: 65 mg/dL — ABNORMAL HIGH (ref 6–20)
BUN: 65 mg/dL — ABNORMAL HIGH (ref 6–20)
CO2: 24 mmol/L (ref 22–32)
CO2: 24 mmol/L (ref 22–32)
Calcium: 8.6 mg/dL — ABNORMAL LOW (ref 8.9–10.3)
Calcium: 8.6 mg/dL — ABNORMAL LOW (ref 8.9–10.3)
Chloride: 95 mmol/L — ABNORMAL LOW (ref 98–111)
Chloride: 94 mmol/L — ABNORMAL LOW (ref 98–111)
Creatinine, Ser: 4.61 mg/dL — ABNORMAL HIGH (ref 0.61–1.24)
Creatinine, Ser: 4.62 mg/dL — ABNORMAL HIGH (ref 0.61–1.24)
GFR calc Af Amer: 18 mL/min — ABNORMAL LOW (ref >60)
GFR calc Af Amer: 18 mL/min — ABNORMAL LOW (ref >60)
GFR calc non Af Amer: 15 mL/min — ABNORMAL LOW (ref >60)
GFR calc non Af Amer: 15 mL/min — ABNORMAL LOW (ref >60)
Glucose, Bld: 93 mg/dL (ref 70–99)
Glucose, Bld: 87 mg/dL (ref 70–99)
Phosphorus: 4.5 mg/dL (ref 2.5–4.6)
Phosphorus: 4.5 mg/dL (ref 2.5–4.6)
Potassium: 4.4 mmol/L (ref 3.5–5.1)
Potassium: 4.4 mmol/L (ref 3.5–5.1)
Sodium: 130 mmol/L — ABNORMAL LOW (ref 135–145)
Sodium: 131 mmol/L — ABNORMAL LOW (ref 135–145)

## 2019-03-30 LAB — IRON AND TIBC
Iron: 33 ug/dL — ABNORMAL LOW (ref 45–182)
Saturation Ratios: 12 % — ABNORMAL LOW (ref 17.9–39.5)
TIBC: 279 ug/dL (ref 250–450)
UIBC: 246 ug/dL

## 2019-03-30 LAB — CYCLOSPORINE: Cyclosporine, LabCorp: 231 ng/mL (ref 100–400)

## 2019-03-30 LAB — FERRITIN: Ferritin: 126 ng/mL (ref 24–336)

## 2019-03-30 MED ORDER — DILTIAZEM HCL 60 MG PO TABS
60.0000 mg | ORAL_TABLET | Freq: Three times a day (TID) | ORAL | Status: DC
  Administered 2019-03-30 – 2019-03-31 (×3): 60 mg via ORAL
  Filled 2019-03-30 (×3): qty 1

## 2019-03-30 MED ORDER — SODIUM CHLORIDE 0.9 % IV SOLN
510.0000 mg | Freq: Once | INTRAVENOUS | Status: AC
  Administered 2019-03-30: 510 mg via INTRAVENOUS
  Filled 2019-03-30: qty 17

## 2019-03-30 MED ORDER — FUROSEMIDE 10 MG/ML IJ SOLN
80.0000 mg | Freq: Once | INTRAMUSCULAR | Status: AC
  Administered 2019-03-30: 80 mg via INTRAVENOUS
  Filled 2019-03-30: qty 8

## 2019-03-30 MED ORDER — LACOSAMIDE 50 MG PO TABS
100.0000 mg | ORAL_TABLET | Freq: Two times a day (BID) | ORAL | Status: DC
  Administered 2019-03-30 – 2019-03-31 (×2): 100 mg via ORAL
  Filled 2019-03-30 (×2): qty 2

## 2019-03-30 MED ORDER — OXYCODONE-ACETAMINOPHEN 5-325 MG PO TABS
1.0000 | ORAL_TABLET | Freq: Four times a day (QID) | ORAL | Status: DC | PRN
  Administered 2019-03-30 – 2019-03-31 (×4): 2 via ORAL
  Filled 2019-03-30 (×4): qty 2

## 2019-03-30 NOTE — Plan of Care (Signed)
  Problem: Clinical Measurements: Goal: Diagnostic test results will improve Outcome: Progressing   

## 2019-03-30 NOTE — Progress Notes (Signed)
  Date: 03/30/2019  Patient name: John Cline.  Medical record number: [id number]  Date of birth: 26-Aug-1986   I have seen and evaluated this patient and I have discussed the plan of care with the house staff. Please see their note for complete details. I concur with their findings with the following additions/corrections: John Cline was seen this morning on team rounds.  I agree with Dr. Webb Silversmith plans to switch from IV opioids to oral opioids.  I have reviewed nephrology's notes and she notes improved hyponatremia with increased protein intake along with IV Lasix.  She plans to re-dose IV Lasix today.  His edema has definitely improved.  Nephrology is also modifying his diltiazem dosage for better blood pressure control.  Everything is moving in the right direction and I anticipate discharge in the next few days.  [name], MD 03/30/2019, 4:08 PM

## 2019-03-30 NOTE — Plan of Care (Signed)
  Problem: Education: Goal: Knowledge of General Education information will improve Description Including pain rating scale, medication(s)/side effects and non-pharmacologic comfort measures Outcome: Progressing   

## 2019-03-30 NOTE — Progress Notes (Signed)
Subjective: John Cline is feeling better today. He was able to eat a lot yesterday and was eating a muffin and drinking coffee while we talked. His pain and nausea have improved. He expressed concern about these continued episodes and we counseled on marijuana cessation as possibly being helpful. He has no other complaints or concerns.  Consults: nephrology on board  Objective:  Vital signs in last 24 hours: Vitals:   03/29/19 2310 03/30/19 0436 03/30/19 0757 03/30/19 1107  BP: (!) 146/109 (!) 135/92 (!) 144/105 (!) 148/104  Pulse: 84 (!) 50    Resp: 10 15 10 18   Temp: 98.1 F (36.7 C) 98.4 F (36.9 C) 98.2 F (36.8 C) 98.3 F (36.8 C)  TempSrc: Oral Oral Oral Oral  SpO2: 95% 97% 96% 96%  Weight:  76.3 kg    Height:       Physical Exam  Constitutional: He is oriented to person, place, and time. No distress.  HENT:  Head: Normocephalic and atraumatic.  Cardiovascular: Normal rate and intact distal pulses.  irregularly irregular rhythm; edematous extremities  Pulmonary/Chest: Effort normal and breath sounds normal. No respiratory distress. He has no wheezes.  Abdominal: Soft. Bowel sounds are normal. He exhibits no distension.  Well healed scar LLQ from kidney transplant  Neurological: He is alert and oriented to person, place, and time.  Skin: Skin is warm and dry. He is not diaphoretic. No erythema.  Nursing note and vitals reviewed.  IOs: out 2.6 L net yesterday  Labs:  CMP Latest Ref Rng & Units 03/30/2019 03/30/2019 03/29/2019  Glucose 70 - 99 mg/dL 87 93 80  BUN 6 - 20 mg/dL 65(H) 65(H) 65(H)  Creatinine 0.61 - 1.24 mg/dL 4.62(H) 4.61(H) 4.82(H)  Sodium 135 - 145 mmol/L 131(L) 130(L) 123(L)  Potassium 3.5 - 5.1 mmol/L 4.4 4.4 4.5  Chloride 98 - 111 mmol/L 94(L) 95(L) 89(L)  CO2 22 - 32 mmol/L 24 24 21(L)  Calcium 8.9 - 10.3 mg/dL 8.6(L) 8.6(L) 8.6(L)  Total Protein 6.5 - 8.1 g/dL - - -  Total Bilirubin 0.3 - 1.2 mg/dL - - -  Alkaline Phos 38 - 126 U/L - - -   AST 15 - 41 U/L - - -  ALT 0 - 44 U/L - - -   CBC Latest Ref Rng & Units 03/28/2019 03/27/2019 03/26/2019  WBC 4.0 - 10.5 K/uL 5.0 5.3 -  Hemoglobin 13.0 - 17.0 g/dL 8.4(L) 8.4(L) 9.2(L)  Hematocrit 39.0 - 52.0 % 24.0(L) 23.7(L) 27.0(L)  Platelets 150 - 400 K/uL 216 222 -   Assessment/Plan:  Assessment: John Cline is a 33 yo M with a PMHx of 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 Patient hyponatremic on admission with improvement with po intake, fluid restriction and IV lasix.   Plan: Active Problems:    Abdominal pain -patient pain has improved, with 50% less IV pain medication requirement -no emesis or diarrhea noted; CMV VL pending -continue medications for sx control  -transition from IV to oral pain regimen in preparation for discharge    Hyponatremia -Na of 121 on presentation, now 130 with fluid restriction, IV lasix yesterday and increased po intake (nutrition recommended supplements) with zofran -hx of hyponatremia, neprhology thinks secondary to  low solute intake and continued consumption of hypotonic fluids  -pt still edematous on exam, although decreased from days prior -out 2.6 L net yesterday -per neprho, continue fluid restriction, zofran and po intake, IV lasix again today -fu with RFTs  -fu nephro recs    ESRD s/p transplant: -continue home regimen of cyclosporine, azathioprine and prednisone -creatinine 4.62 today, down from 5.72 on admission with baseline of around 3 -continue following creatinine with RFTs -CysA level 231 -nephrology consulted and recommends maintaining current CysA dose and rechecking tonight     Atrial  Fibrillation/Flutter: -home meds previously held due to bradycardia  -dilt 30q6h started yesterday and changed to 60 q8h (home dose 180 CR) -still holding coreg -continue apixaban 2.5 mg BID    Hypertension: -continue clonidine 0.3 mg TID -pt received IV lasix yesterday per nephro for hyponatremia -amlodipine and spironolactone held     Seizure Disorder: -continue home meds for now at renally adjusted doses   Dispo: Anticipated discharge in approximately in 2-3 days.  Al Decant, MD 03/30/2019, 12:20 PM Pager: [phone]

## 2019-03-30 NOTE — Progress Notes (Signed)
[facility] Progress Note   Subjective:  Still c/o abd pain but was able to tolerate chicken sandwich for dinner.  UOP 3.6L yesterday.  Intake ~1L.   Objective Vitals:   03/29/19 1930 03/29/19 2310 03/30/19 0436 03/30/19 0757  BP: (!) 124/96 (!) 146/109 (!) 135/92 (!) 144/105  Pulse: 74 84 (!) 50   Resp: 10 10 15 10   Temp: 97.9 F (36.6 C) 98.1 F (36.7 C) 98.4 F (36.9 C) 98.2 F (36.8 C)  TempSrc: Oral Oral Oral Oral  SpO2: 97% 95% 97% 96%  Weight:   76.3 kg   Height:       Physical Exam General: lying flat in bed, appears much more comfortable Heart: RRR, no rub Lungs: normal WOB flat on RA Abdomen: soft, mild distended, nontender to palpation. LLQ renal transplant Extremities: trace ankle edema, edema has now moved centrally - 1+ pitting thigh, 1+ arms  Additional Objective Labs: Basic Metabolic Panel: Recent Labs  Lab 03/29/19 0408 03/30/19 0316 03/30/19 0550  NA 123* 130* 131*  K 4.5 4.4 4.4  CL 89* 95* 94*  CO2 21* 24 24  GLUCOSE 80 93 87  BUN 65* 65* 65*  CREATININE 4.82* 4.61* 4.62*  CALCIUM 8.6* 8.6* 8.6*  PHOS 4.5 4.5 4.5   Liver Function Tests: Recent Labs  Lab 03/26/19 1130  03/29/19 0408 03/30/19 0316 03/30/19 0550  AST 17  --   --   --   --   ALT 13  --   --   --   --   ALKPHOS 43  --   --   --   --   BILITOT 1.2  --   --   --   --   PROT 6.2*  --   --   --   --   ALBUMIN 3.8   < > 3.3* 3.2* 3.3*   < > = values in this interval not displayed.   Recent Labs  Lab 03/26/19 1130  LIPASE 33   CBC: Recent Labs  Lab 03/26/19 1130 03/26/19 1208 03/27/19 0321 03/28/19 0247  WBC 7.7  --  5.3 5.0  NEUTROABS 7.1  --   --   --   HGB 9.8* 9.2* 8.4* 8.4*  HCT 29.0* 27.0* 23.7* 24.0*  MCV 91.5  --  87.8 88.6  PLT 256  --  222 216   Blood Culture    Component Value Date/Time   SDES BLOOD RIGHT HAND 03/27/2019 0722   SPECREQUEST AEROBIC BOTTLE ONLY Blood Culture adequate volume 03/27/2019 0722   CULT  03/27/2019 0722     NO GROWTH 2 DAYS Performed at [facility], [place] [address], [place], [place] [address]    REPTSTATUS PENDING 03/27/2019 0973    Cardiac Enzymes: No results for input(s): CKTOTAL, CKMB, CKMBINDEX, TROPONINI in the last 168 hours. CBG: No results for input(s): GLUCAP in the last 168 hours. Iron Studies:  Recent Labs    03/30/19 0316  IRON 33*  TIBC 279  FERRITIN 126   @[email or web address] @ Studies/Results: No results found. Medications:  . apixaban  2.5 mg Oral BID  . azaTHIOprine  50 mg Oral Daily  . chlorhexidine  15 mL Mouth Rinse BID  . cloNIDine  0.3 mg Oral TID  . cycloSPORINE modified  75 mg Oral BID  . insulin aspart  10 Units Intravenous Once   And  . dextrose  1 ampule Intravenous Once  . dicyclomine  10 mg Oral TID AC & HS  .  diltiazem  30 mg Oral Q6H  . divalproex  500 mg Oral BID  . feeding supplement (PRO-STAT SUGAR FREE 64)  30 mL Oral TID  . lacosamide  200 mg Oral BID  . lamoTRIgine  50 mg Oral Daily  . leflunomide  20 mg Oral Daily  . levETIRAcetam  500 mg Oral BID  . lipase/protease/amylase  48,000 Units Oral TID WC  . mouth rinse  15 mL Mouth Rinse q12n4p  . mirtazapine  7.5 mg Oral QHS  . pantoprazole  40 mg Oral BID  . predniSONE  10 mg Oral Daily  . sodium bicarbonate  1,950 mg Oral TID  . sodium chloride flush  3 mL Intravenous Q12H  . tamsulosin  0.4 mg Oral Daily     Assessment/Plan: **chronic abdominal pain + diarrhea:  Has had extensive evaluations and etiology typically attributed to chronic pancreatitis.  Imaging with CT here unrevealing.  In setting of acute worsening in transplant immunosuppressed host r/o infectious etiologies needed.  C diff negative, GI pathogen panel negative.  CMV VL pending.   Agree with primary teams plan and agree with marijuana cessation.   **hyponatremia:  Has h/o SIADH and hypervolemic hyponatremia which has improved with fluid restriction and diuresis.  This admission his urine osm are on low side and  along with hypoalbuminemia I think this is at least in part related to poor intake.  Sodium improving with increased po intake and s/p lasix 80 IV yesterday.  Cont po intake protein, 1561mL fluid restriction.  Give another dose of lasix 80 IV today.   **ESRD s/p renal transplant x 2:  Baseline Cr lately around mid 3s, some as high as 4s.  Biopsy a few weeks ago without acute rejection.  Now with AKI Cr in the mid 5s this admission but into the 4.6s today.   UA not suggestive of pyelonephritis.  Says compliant with medications.  Renal transplant US shows mild hydronephrosis - has h/o mild retention; check PVRs and I/O cath if > 296mL - has not been necessary.  No plans for renal biopsy but will follow. Per chart has been relisted for another transplant.  Cont home cysA, azathioprine, prednisone.  CysA level 231.  Maintain current dose and check another level tonight esp in light of diltiazem adjustments during admission.  **HTN:   BP ok.  On clonidine 0.3 TID, diltiazem 30q6h added 7/15 - change to 60 q8h (home dose is 180 CR); other home meds on hold.  **Anemia:  Hb 8s- similar to recents at [facility]. Follow. Iron indices pending, consider ESA if iron ok, otherwise supplement iron.  Hemoccult neg this admission.   **Abnormal CXR: CT chest with effusions and ascites but no infiltrates.   **Seizure DO: on home meds  **A fib/flutter:  Coreg and diltiazem on hold for bradycardia; on apixiban 2.5 BID.  HR now in the 70-90s. Resume diltiazem but use 60q8h for now.   Jannifer Hick MD 03/30/2019, 8:18 AM  [facility] Pager: [phone]

## 2019-03-31 DIAGNOSIS — Z888 Allergy status to other drugs, medicaments and biological substances status: Secondary | ICD-10-CM

## 2019-03-31 DIAGNOSIS — D649 Anemia, unspecified: Secondary | ICD-10-CM

## 2019-03-31 DIAGNOSIS — Z885 Allergy status to narcotic agent status: Secondary | ICD-10-CM

## 2019-03-31 LAB — RENAL FUNCTION PANEL
Albumin: 3.6 g/dL (ref 3.5–5.0)
Anion gap: 12 (ref 5–15)
BUN: 66 mg/dL — ABNORMAL HIGH (ref 6–20)
CO2: 27 mmol/L (ref 22–32)
Calcium: 9 mg/dL (ref 8.9–10.3)
Chloride: 94 mmol/L — ABNORMAL LOW (ref 98–111)
Creatinine, Ser: 4.36 mg/dL — ABNORMAL HIGH (ref 0.61–1.24)
GFR calc Af Amer: 19 mL/min — ABNORMAL LOW (ref >60)
GFR calc non Af Amer: 16 mL/min — ABNORMAL LOW (ref >60)
Glucose, Bld: 91 mg/dL (ref 70–99)
Phosphorus: 3.9 mg/dL (ref 2.5–4.6)
Potassium: 4 mmol/L (ref 3.5–5.1)
Sodium: 133 mmol/L — ABNORMAL LOW (ref 135–145)

## 2019-03-31 LAB — CMV DNA, QUANTITATIVE, PCR
CMV DNA Quant: POSITIVE IU/mL
Log10 CMV Qn DNA Pl: UNDETERMINED log10 IU/mL

## 2019-03-31 MED ORDER — DILTIAZEM HCL 60 MG PO TABS: 60.0000 mg | ORAL_TABLET | Freq: Three times a day (TID) | ORAL | 0 refills | Status: DC

## 2019-03-31 MED ORDER — LAMOTRIGINE 25 MG PO TABS: 50.0000 mg | ORAL_TABLET | Freq: Every day | ORAL | 0 refills | Status: DC

## 2019-03-31 MED ORDER — LEVETIRACETAM 500 MG PO TABS: 500.0000 mg | ORAL_TABLET | Freq: Two times a day (BID) | ORAL | 0 refills | Status: DC

## 2019-03-31 MED ORDER — LACOSAMIDE 100 MG PO TABS: 100.0000 mg | ORAL_TABLET | Freq: Two times a day (BID) | ORAL | 0 refills | Status: DC

## 2019-03-31 MED ORDER — FUROSEMIDE 40 MG PO TABS: 20.0000 mg | ORAL_TABLET | Freq: Every day | ORAL | 11 refills | Status: DC

## 2019-03-31 NOTE — Progress Notes (Signed)
  Date: 03/31/2019  Patient name: John Cline.  Medical record number: [id number]  Date of birth: 07-12-1986   I have seen and evaluated this patient and I have discussed the plan of care with the house staff. Please see their note for complete details. I concur with their findings with the following additions/corrections: [name] was seen this morning on team rounds.  I have reviewed Dr. Webb Silversmith progress note and agree with the plan to discharge him to home with [facility] follow-up.  [name], MD 03/31/2019, 2:56 PM

## 2019-03-31 NOTE — Care Management Important Message (Signed)
Important Message  Patient Details  Name: John Cline. MRN: [id number] Date of Birth: 10-Apr-1986   Medicare Important Message Given:  Yes     Shelda Altes 03/31/2019, 12:09 PM

## 2019-03-31 NOTE — Discharge Summary (Signed)
Name: John Cline. MRN: [id number] DOB: 05/12/1986 33 y.o. PCP: Servando Snare, MD  Date of Admission: 03/26/2019 10:57 AM Date of Discharge: 03/31/2019 Attending Physician: No att. providers found  Discharge Diagnosis: 1. Chronic abdominal pain/diarrhea 2. Hyponatremia 3. ESRD s/p renal transplant  4. HTN 5. Anemia 6. Seizure DO 7. A fib/flutter  Discharge Medications: Allergies as of 03/31/2019      Reactions   Dapsone Other (See Comments)   Methemoglobinemia   Lipase Other (See Comments)   Constipation   Morphine And Related Rash      Medication List    STOP taking these medications   diltiazem 180 MG 24 hr capsule Commonly known as: CARDIZEM CD     TAKE these medications   acetaminophen 500 MG tablet Commonly known as: TYLENOL Take 500 mg by mouth every 6 (six) hours as needed for mild pain or headache.   amLODipine 5 MG tablet Commonly known as: NORVASC Take 5 mg by mouth daily.   azaTHIOprine 50 MG tablet Commonly known as: IMURAN Take 100 mg by mouth daily.   carvedilol 25 MG tablet Commonly known as: COREG Take 37.5 mg by mouth 2 (two) times daily with a meal.   cloNIDine 0.3 MG tablet Commonly known as: CATAPRES Take 0.3 mg by mouth 3 (three) times daily.   Creon 24000-76000 units Cpep Generic drug: Pancrelipase (Lip-Prot-Amyl) Take 1-2 capsules by mouth 3 (three) times daily as needed (with meals).   cycloSPORINE modified 25 MG capsule Commonly known as: NEORAL Take 75 mg by mouth 2 (two) times daily.   dicyclomine 10 MG capsule Commonly known as: BENTYL Take 20 mg by mouth 4 (four) times daily as needed for spasms.   diltiazem 60 MG tablet Commonly known as: CARDIZEM Take 1 tablet (60 mg total) by mouth 3 (three) times daily.   divalproex 250 MG DR tablet Commonly known as: DEPAKOTE Take 500 mg by mouth 2 (two) times daily.   Eliquis 2.5 MG Tabs tablet Generic drug: apixaban Take 2.5 mg by mouth 2 (two) times daily.   furosemide 40 MG tablet Commonly known as: LASIX Take 0.5 tablets (20 mg total) by mouth daily. What changed:   how much to take  when to take this   gabapentin 300 MG capsule Commonly known as: NEURONTIN Take 300 mg by mouth 2 (two) times daily.   Lacosamide 100 MG Tabs Take 1 tablet (100 mg total) by mouth 2 (two) times daily. What changed: how much to take   lamoTRIgine 25 MG tablet Commonly known as: LAMICTAL Take 2 tablets (50 mg total) by mouth daily. What changed:   how much to take  when to take this   leflunomide 20 MG tablet Commonly known as: ARAVA Take 20 mg by mouth daily.   levETIRAcetam 500 MG tablet Commonly known as: KEPPRA Take 1 tablet (500 mg total) by mouth 2 (two) times daily. What changed: how much to take   magnesium oxide 400 MG tablet Commonly known as: MAG-OX Take 400 mg by mouth daily.   mirtazapine 15 MG tablet Commonly known as: REMERON Take 7.5 mg by mouth at bedtime.   ondansetron 8 MG tablet Commonly known as: ZOFRAN Take 8 mg by mouth every 8 (eight) hours as needed for nausea or vomiting.   pantoprazole 20 MG tablet Commonly known as: PROTONIX Take 20 mg by mouth daily.   predniSONE 10 MG tablet Commonly known as: DELTASONE Take 10 mg by mouth daily.  sildenafil 50 MG tablet Commonly known as: VIAGRA Take 50 mg by mouth daily as needed for erectile dysfunction.   sodium bicarbonate 650 MG tablet Take 1,300 mg by mouth 3 (three) times daily.   spironolactone 25 MG tablet Commonly known as: ALDACTONE Take 25 mg by mouth daily.   tamsulosin 0.4 MG Caps capsule Commonly known as: FLOMAX Take 0.4 mg by mouth daily.   Vitamin D3 1.25 MG (50000 UT) Caps Take 50,000 Units by mouth every 7 (seven) days.       Disposition and follow-up:   Mr.[employer] was discharged from [facility] in Good condition.  At the hospital follow up visit please address:  1.  Labs / imaging needed at time  of follow-up: Cyclosporine level 1 week after discharge BMP for sodium level and renal function CBC to check Hgb  2.  Pending labs/ test needing follow-up: none  Follow-up Appointments: Follow-up Information    Detwiler, Tamera Stands, MD Follow up.   Specialty: Nephrology Why: follow up with your doctor within one week Contact information: [address] [place] [address] [facility] Course by problem list: 1. Chronic abdominal pain/diarrhea -pt presented with complaints of abdominal pain, vomiting and diarrhea -has had extensive work up and evaluations for such, sometimes attributed to chronic pancreatitis and other times to chronic pain from scar tissue from transplant surgeries -imaging unrevealing -given pt on immunosuppressive meds, infectious causes were explored, c diff and gi path panel negative -patient has hx of marijuana use and was counseled on cessation to prevent nausea/vomiting  2. Hyponatremia -pt presented with hyponatremia with known h/o SIADH and hypervolemic hyponatremia -Na improved with fluid restriction and diuresis -urine osm on low side along with hypoalbuminemia, suggestive of poor po intake -continue po intake protein with 1500 ml fluid restriction -continue furosemide 20 mg daily as outpatient for 1 week  -fu panel 1 week from discharge   3. ESRD s/p renal transplant  -pt presented with AKI on CKD with Cr in mid 5s with baseline around mid 3s, sometimes the 4s -UA not suggestive of pyelo, pt complaint with medications -renal US showed mild hydro -recent biopsy was without acute rejection -home cysA, azathioprine and prednisone continued -Cr near baseline in the low 4s on discharge -fu cyclosporine and renal function level in 1 week post discharge  4. HTN -patient low to normotensive on admission -home clonidine 0.3 TID was continued but other home meds, amlodipine, lasix and spironolactone were held during admission  -pt normotensive during admission with some elevated diastolics -home meds resumed on discharge -lasix dose changed to 20 mg once daily for one week for volume rather than bp management  5. Anemia -Hgb 9.8 on admission, was intravascularly volume down at the time -Hemoccult neg -Hgb stable in the 8s throughout admission with additional po intake -Iron and saturation low, received IV iron transfusion -fu Hgb outpatient   6. Seizure DO -patient continued home meds at renally adjusted doses  7. A fib/flutter -pt in afib throughout admission -received apixaban 2.5 BID -coreg and diltiazem held for most of admission due to bradycardia -diltiazem resumed at 60q8h -coreg resumed at discharge  Discharge Vitals:   BP (!) 150/99 (BP Location: Right Arm)   Pulse 69   Temp 98.1 F (36.7 C) (Oral)   Resp 13   Ht 5\' 7"  (1.702 m)   Wt 75.4 kg   SpO2 95%  BMI 26.03 kg/m   Pertinent Labs, Studies, and Procedures:  BMP Latest Ref Rng & Units 03/31/2019 03/30/2019 03/30/2019  Glucose 70 - 99 mg/dL 91 87 93  BUN 6 - 20 mg/dL 66(H) 65(H) 65(H)  Creatinine 0.61 - 1.24 mg/dL 4.36(H) 4.62(H) 4.61(H)  Sodium 135 - 145 mmol/L 133(L) 131(L) 130(L)  Potassium 3.5 - 5.1 mmol/L 4.0 4.4 4.4  Chloride 98 - 111 mmol/L 94(L) 94(L) 95(L)  CO2 22 - 32 mmol/L 27 24 24   Calcium 8.9 - 10.3 mg/dL 9.0 8.6(L) 8.6(L)   CBC Latest Ref Rng & Units 03/28/2019 03/27/2019 03/26/2019  WBC 4.0 - 10.5 K/uL 5.0 5.3 -  Hemoglobin 13.0 - 17.0 g/dL 8.4(L) 8.4(L) 9.2(L)  Hematocrit 39.0 - 52.0 % 24.0(L) 23.7(L) 27.0(L)  Platelets 150 - 400 K/uL 216 222 -   Renal US Transplant w/ Doppler:  1. Mild increased cortical echogenicity of transplant kidney in the LLQ ww/ mild transplant hydronephrosis 2. Normal arterial resistance indices and waveforms at the level of the transplant artery and within the renal transplant 3. Patent transplant renal vein 4. Free fluid adjacent to the renal transplant appears to be related to  ascites in peritoneal cavity  Discharge Instructions: Discharge Instructions    Call MD for:  persistant nausea and vomiting   Complete by: As directed      Signed: Al Decant, MD 04/03/2019, 6:14 AM   Pager: [phone]

## 2019-03-31 NOTE — Progress Notes (Addendum)
[facility]    NEPHROLOGY PROGRESS NOTE  SUBJECTIVE: Patient seen and examined.  Complains of being swollen.  Denies chest pain, shortness of breath, nausea, vomiting, diarrhea or dysuria.  All other review of systems are negative.  Once the home.     OBJECTIVE:  Vitals:   03/31/19 0410 03/31/19 0747  BP: (!) 142/109 (!) 150/99  Pulse:  69  Resp: 14 13  Temp: 98 F (36.7 C) 98.1 F (36.7 C)  SpO2: 99% 95%    Intake/Output Summary (Last 24 hours) at 03/31/2019 1357 Last data filed at 03/31/2019 1046 Gross per 24 hour  Intake 609.99 ml  Output 2295 ml  Net -1685.01 ml      General:  AAOx3 NAD HEENT: MMM [place] AT anicteric sclera Neck:  No JVD, no adenopathy CV:  Heart RRR  Lungs:  L/S CTA bilaterally Abd:  abd SNT/ND with normal BS GU:  Bladder non-palpable, Left lower quadrant graft without tenderness Extremities:  +1 to +2 bilateral extremity edema. Skin:  No skin rash  MEDICATIONS:  . apixaban  2.5 mg Oral BID  . azaTHIOprine  50 mg Oral Daily  . chlorhexidine  15 mL Mouth Rinse BID  . cloNIDine  0.3 mg Oral TID  . cycloSPORINE modified  75 mg Oral BID  . dicyclomine  10 mg Oral TID AC & HS  . diltiazem  60 mg Oral Q8H  . divalproex  500 mg Oral BID  . feeding supplement (PRO-STAT SUGAR FREE 64)  30 mL Oral TID  . lacosamide  100 mg Oral BID  . lamoTRIgine  50 mg Oral Daily  . leflunomide  20 mg Oral Daily  . levETIRAcetam  500 mg Oral BID  . lipase/protease/amylase  48,000 Units Oral TID WC  . mouth rinse  15 mL Mouth Rinse q12n4p  . mirtazapine  7.5 mg Oral QHS  . pantoprazole  40 mg Oral BID  . predniSONE  10 mg Oral Daily  . sodium bicarbonate  1,950 mg Oral TID  . sodium chloride flush  3 mL Intravenous Q12H  . tamsulosin  0.4 mg Oral Daily       LABS:   CBC Latest Ref Rng & Units 03/28/2019 03/27/2019 03/26/2019  WBC 4.0 - 10.5 K/uL 5.0 5.3 -  Hemoglobin 13.0 - 17.0 g/dL 8.4(L) 8.4(L) 9.2(L)  Hematocrit 39.0 - 52.0 % 24.0(L)  23.7(L) 27.0(L)  Platelets 150 - 400 K/uL 216 222 -    CMP Latest Ref Rng & Units 03/31/2019 03/30/2019 03/30/2019  Glucose 70 - 99 mg/dL 91 87 93  BUN 6 - 20 mg/dL 66(H) 65(H) 65(H)  Creatinine 0.61 - 1.24 mg/dL 4.36(H) 4.62(H) 4.61(H)  Sodium 135 - 145 mmol/L 133(L) 131(L) 130(L)  Potassium 3.5 - 5.1 mmol/L 4.0 4.4 4.4  Chloride 98 - 111 mmol/L 94(L) 94(L) 95(L)  CO2 22 - 32 mmol/L 27 24 24   Calcium 8.9 - 10.3 mg/dL 9.0 8.6(L) 8.6(L)  Total Protein 6.5 - 8.1 g/dL - - -  Total Bilirubin 0.3 - 1.2 mg/dL - - -  Alkaline Phos 38 - 126 U/L - - -  AST 15 - 41 U/L - - -  ALT 0 - 44 U/L - - -    Lab Results  Component Value Date   CALCIUM 9.0 03/31/2019   CAION 1.08 (L) 03/26/2019   PHOS 3.9 03/31/2019       Component Value Date/Time   COLORURINE STRAW (A) 03/27/2019 1223   APPEARANCEUR CLEAR 03/27/2019 1223  APPEARANCEUR Clear 05/25/2014 1509   LABSPEC 1.005 03/27/2019 1223   LABSPEC 1.005 05/25/2014 1509   PHURINE 6.0 03/27/2019 1223   GLUCOSEU NEGATIVE 03/27/2019 1223   GLUCOSEU Negative 05/25/2014 1509   HGBUR SMALL (A) 03/27/2019 1223   BILIRUBINUR NEGATIVE 03/27/2019 1223   BILIRUBINUR Negative 05/25/2014 1509   KETONESUR NEGATIVE 03/27/2019 1223   PROTEINUR NEGATIVE 03/27/2019 1223   NITRITE NEGATIVE 03/27/2019 1223   LEUKOCYTESUR NEGATIVE 03/27/2019 1223   LEUKOCYTESUR Negative 05/25/2014 1509      Component Value Date/Time   HCO3 19.3 (L) 03/26/2019 1208   TCO2 20 (L) 03/26/2019 1208   ACIDBASEDEF 6.0 (H) 03/26/2019 1208   O2SAT 100.0 03/26/2019 1208       Component Value Date/Time   IRON 33 (L) 03/30/2019 0316   TIBC 279 03/30/2019 0316   FERRITIN 126 03/30/2019 0316   IRONPCTSAT 12 (L) 03/30/2019 0316       ASSESSMENT/PLAN:    **chronic abdominal pain + diarrhea: Has had extensive evaluations and etiology typically attributed to chronic pancreatitis. Imaging with CT here unrevealing. In setting of acute worsening in transplant immunosuppressed host  r/o infectious etiologies needed. C diff negative, GI pathogen panel negative. CMV VL pending.  Agree with primary teams plan and agree with marijuana cessation.   **hyponatremia: Has h/o SIADH and hypervolemic hyponatremia which has improved with fluid restriction and diuresis.  This admission his urine osm are on low side and along with hypoalbuminemia I think this is at least in part related to poor intake.  Sodium improving with increased po intake and s/p lasix 80 IV yesterday.  Cont po intake protein, 1549mL fluid restriction.  Would continue Furosemide 20 mg Daily as an outpatient 1 week. Will need follow-up panel in 1 week.  **ESRD s/p renal transplant x 2: Baseline Cr lately around mid 3s, some as high as 4s. Biopsy a few weeks ago without acute rejection. Now with AKI Cr in the mid 5s this admission but into the 4s today.  UA not suggestive of pyelonephritis. Says compliant with medications. Renal transplant US shows mild hydronephrosis - has h/o mild retention; check PVRs and I/O cath if > 292mL - has not been necessary. No plans for renal biopsy but will follow. Per chart has been relisted for another transplant.  Cont home cysA, azathioprine, prednisone. CysA level 231.  Maintain current dose and check another level tonight esp in light of diltiazem adjustments during admission.  Will need follow-up cyclosporine level in 1 week post discharge.  Renal function is near baseline.  **HTN: BP ok.  On clonidine 0.3 TID, diltiazem 30q6h added 7/15 - change to 60 q8h (home dose is 180 CR); other home meds on hold.  **Anemia: Hb 8s- similar to recents at [facility]. Follow. Iron saturation low.  Will add oral iron.  Hemoccult neg this admission.   **Abnormal CXR: CT chest with effusions and ascites but no infiltrates.   **Seizure DO: on home meds  **A fib/flutter:  Coreg and diltiazem on hold for bradycardia; on apixiban 2.5 BID.  HR now in the 70-90s. Resume diltiazem but use 60q8h  for now.    Okay for disposition from renal standpoint with above instructions.   [place], DO, [place]

## 2019-03-31 NOTE — Progress Notes (Signed)
Subjective: Mr. Sayegh is feeling better today. He does not complain of any pain. He is eating and drinking well. His swelling is down. He has no other complaints or concerns.  Consults: nephrology on board  Objective:  Vital signs in last 24 hours: Vitals:   03/31/19 0021 03/31/19 0110 03/31/19 0409 03/31/19 0410  BP:  (!) 137/94  (!) 142/109  Pulse: 85     Resp: 15 14  14   Temp: 98.2 F (36.8 C)   98 F (36.7 C)  TempSrc: Oral   Oral  SpO2: 96%   99%  Weight:   75.4 kg   Height:       Physical Exam  Constitutional: He is oriented to person, place, and time. No distress.  HENT:  Head: Normocephalic and atraumatic.  Cardiovascular: Normal rate and intact distal pulses.  irregularly irregular rhythm  Pulmonary/Chest: Effort normal and breath sounds normal. No respiratory distress. He has no wheezes.  Abdominal: Soft. Bowel sounds are normal. He exhibits no distension.  Neurological: He is alert and oriented to person, place, and time.  Skin: Skin is warm and dry. He is not diaphoretic. No erythema.  Nursing note and vitals reviewed.  IOs: out 1.6 L net yesterday, out .6 L out today  Labs:  CMP Latest Ref Rng & Units 03/31/2019 03/30/2019 03/30/2019  Glucose 70 - 99 mg/dL 91 87 93  BUN 6 - 20 mg/dL 66(H) 65(H) 65(H)  Creatinine 0.61 - 1.24 mg/dL 4.36(H) 4.62(H) 4.61(H)  Sodium 135 - 145 mmol/L 133(L) 131(L) 130(L)  Potassium 3.5 - 5.1 mmol/L 4.0 4.4 4.4  Chloride 98 - 111 mmol/L 94(L) 94(L) 95(L)  CO2 22 - 32 mmol/L 27 24 24   Calcium 8.9 - 10.3 mg/dL 9.0 8.6(L) 8.6(L)  Total Protein 6.5 - 8.1 g/dL - - -  Total Bilirubin 0.3 - 1.2 mg/dL - - -  Alkaline Phos 38 - 126 U/L - - -  AST 15 - 41 U/L - - -  ALT 0 - 44 U/L - - -   CBC Latest Ref Rng & Units 03/28/2019 03/27/2019 03/26/2019  WBC 4.0 - 10.5 K/uL 5.0 5.3 -  Hemoglobin 13.0 - 17.0 g/dL 8.4(L) 8.4(L) 9.2(L)  Hematocrit 39.0 - 52.0 % 24.0(L) 23.7(L) 27.0(L)  Platelets 150 - 400 K/uL 216 222 -   Assessment/Plan:   Assessment: Mr. 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 Patient hyponatremic on admission with improvement with po intake, fluid restriction and IV lasix.   Plan: Active Problems:    Abdominal pain -patient pain has improved, managed with oral meds only  -patient ready for dc     Hyponatremia -Na of 121 on presentation, now 133 with fluid restriction, IV lasix yesterday and increased po intake -hx of hyponatremia, neprhology thinks secondary to low solute intake and continued consumption of hypotonic fluids  -out 1.6 L net yesterday, out .6 L so far today net  -stable for discharge per primary team but will defer to nephro    ESRD s/p transplant: -continue home regimen of cyclosporine, azathioprine and prednisone -creatinine 4.36 today, down from 5.72 on admission with baseline of around 3 -continue following creatinine with  RFTs -defer to nephro re discharge    Atrial Fibrillation/Flutter: -home meds previously held due to bradycardia  -dilt 60 q8h (home dose 180 CR) -still holding coreg -continue apixaban 2.5 mg BID    Hypertension: -continue clonidine 0.3 mg TID -pt received IV lasix yesterday per nephro for hyponatremia -amlodipine and spironolactone held     Seizure Disorder: -continue home meds for now at renally adjusted doses   Dispo: Anticipated discharge today or tomorrow.  Al Decant, MD 03/31/2019, 5:55 AM Pager: [phone]

## 2019-03-31 NOTE — Discharge Instructions (Signed)
You were admitted to the hospital for abdominal pain, nausea and vomiting. You were found to have low sodium and decreasing kidney function. Imaging was done of your abdomen which did not demonstrate any acute problems. We tested you for infections and did not find any infection. We treated your pain and nausea with medicine. We treated your low sodium with fluid restriction, by encouraging eating and with IV diuretic medication. Your pain and kidney function improved while you were here. We encourage you to follow up with your nephrologist and continue cessation of marijuana use. Please continue your furosemide 20 mg daily for a week.

## 2019-04-01 LAB — CULTURE, BLOOD (ROUTINE X 2)
Culture: NO GROWTH
Culture: NO GROWTH
Special Requests: ADEQUATE
Special Requests: ADEQUATE

## 2019-04-03 LAB — CYCLOSPORINE: Cyclosporine, LabCorp: 118 ng/mL (ref 100–400)

## 2019-08-28 DIAGNOSIS — N2581 Secondary hyperparathyroidism of renal origin: Secondary | ICD-10-CM | POA: Insufficient documentation

## 2019-08-28 DIAGNOSIS — D509 Iron deficiency anemia, unspecified: Secondary | ICD-10-CM | POA: Insufficient documentation

## 2019-10-26 IMAGING — CT CT MAXILLOFACIAL W/O CM
4 of 8 series · 16 of 47 positions shown, 19 images · non-contrast
Comparison: Head CT 10/12/2013 and maxillofacial CT 07/11/2013

CLINICAL DATA: Syncopal episode hitting right side of head on
furniture. Laceration right periorbital region.

EXAM:
CT HEAD WITHOUT CONTRAST
CT MAXILLOFACIAL WITHOUT CONTRAST
TECHNIQUE: Multidetector CT imaging of the head and maxillofacial structures
were performed using the standard protocol without intravenous
contrast. Multiplanar CT image reconstructions of the maxillofacial
structures were also generated.

[Series 3: head bone · axial · 0.41mm/px · z∈[+326,+444]mm · 10 of 73 slices shown, 13 images]
[im 7/73  brain]
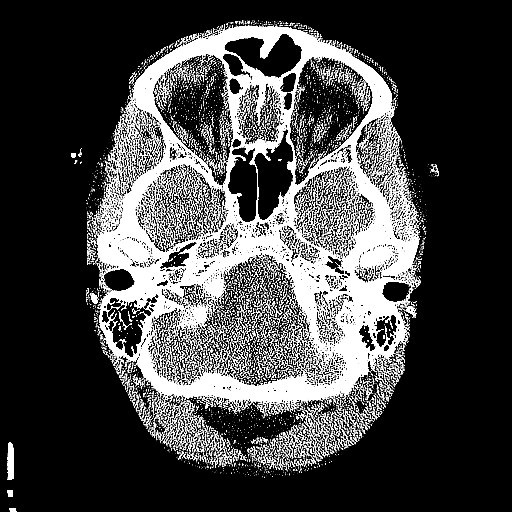
[im 7/73  bone]
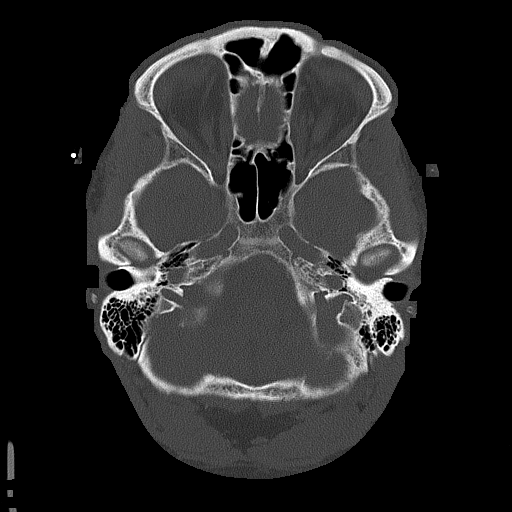
[im 14/73  bone]
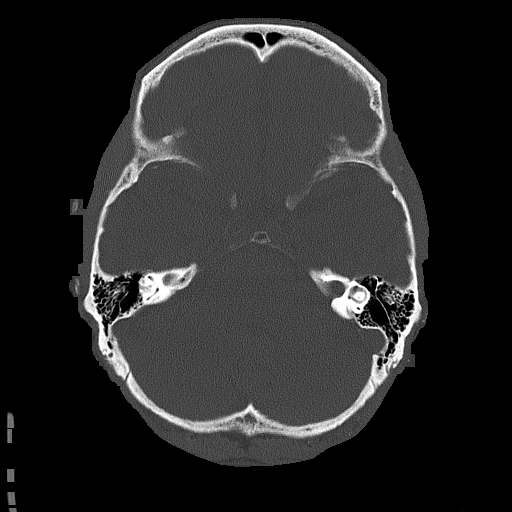
[im 20/73  bone]
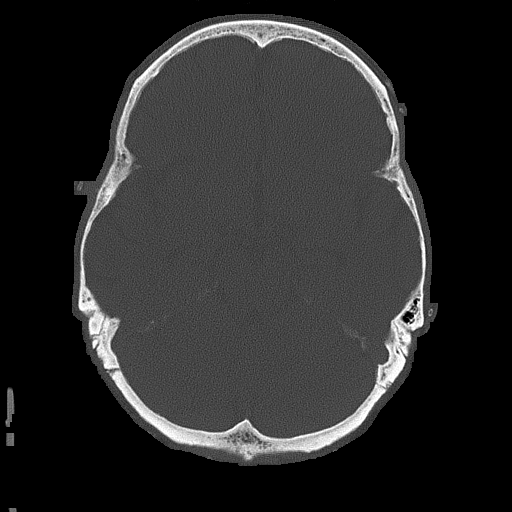
[im 27/73  bone]
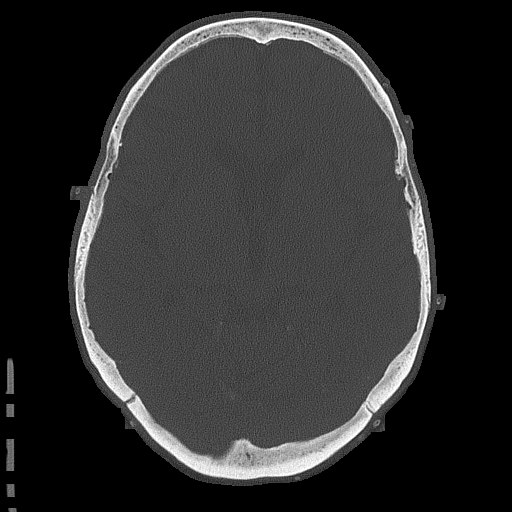
[im 33/73  brain]
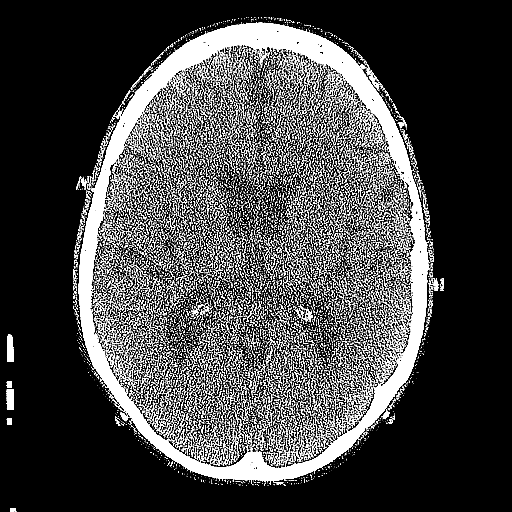
[im 33/73  bone]
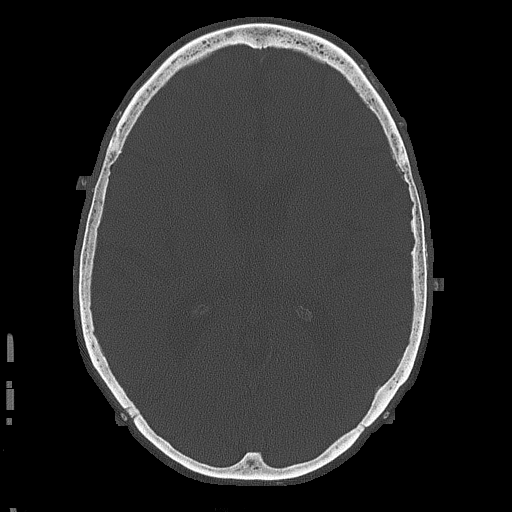
[im 40/73  bone]
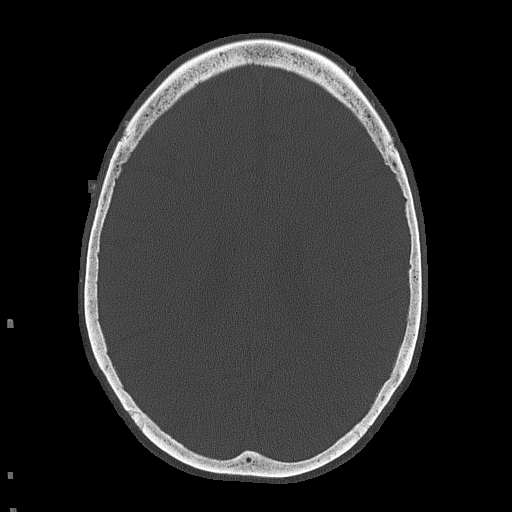
[im 46/73  bone]
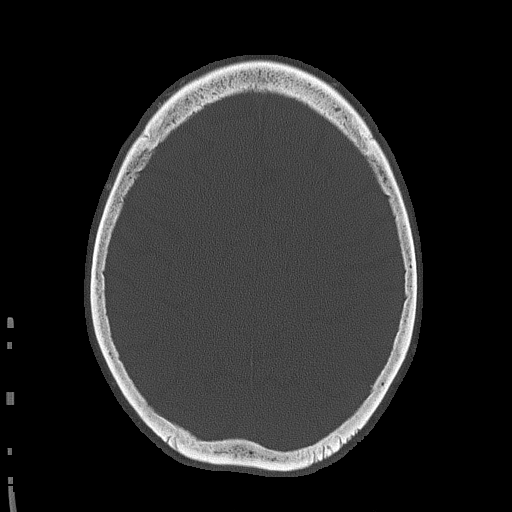
[im 53/73  bone]
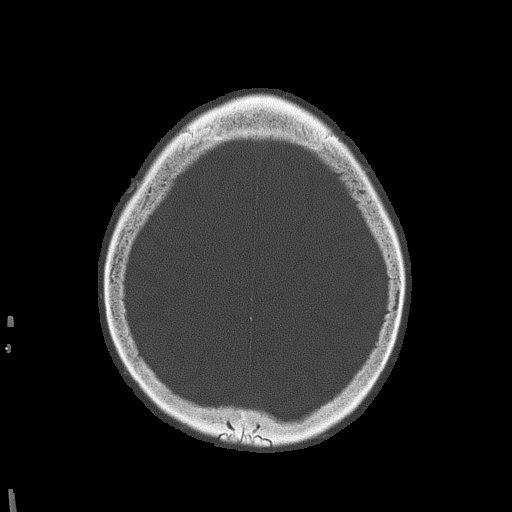
[im 59/73  brain]
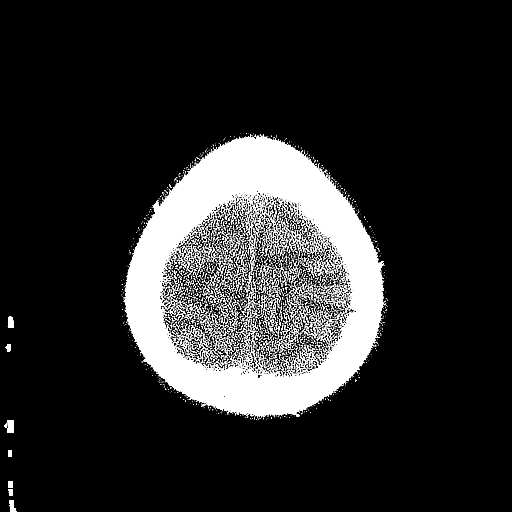
[im 59/73  bone]
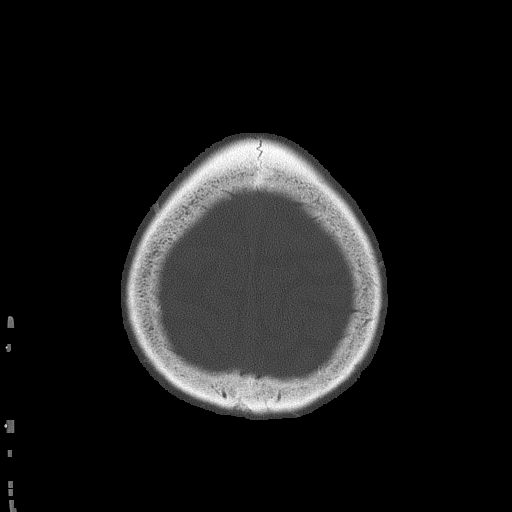
[im 66/73  bone]
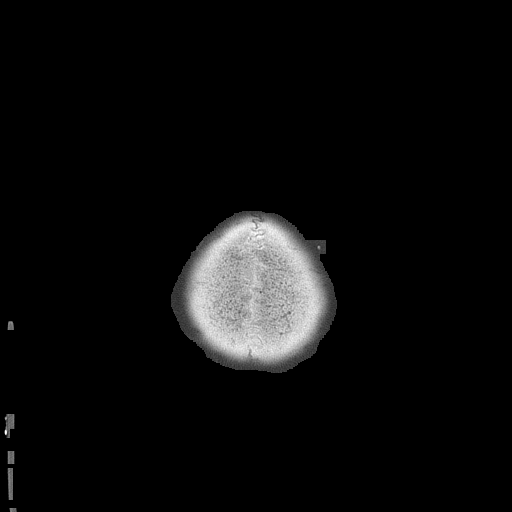

[Series 6: max soft · axial · 0.33mm/px · z∈[+207,+233]mm · 2 of 78 slices shown]
[im 7/78  brain]
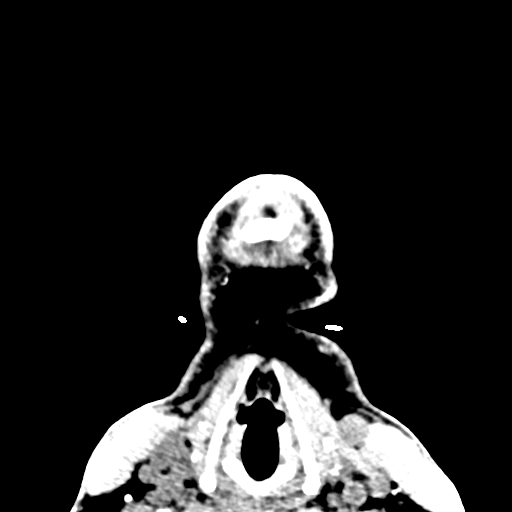
[im 20/78  brain]
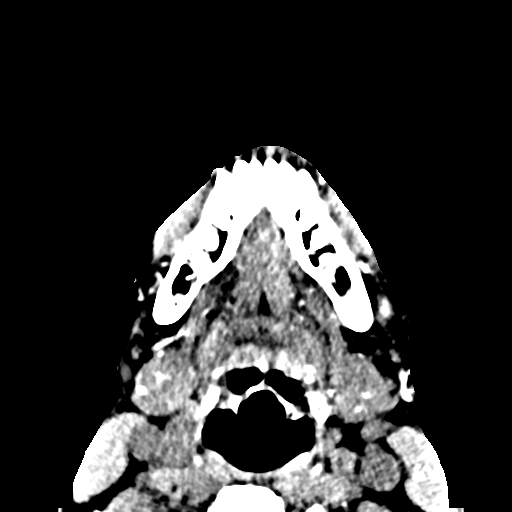

[Series 10: coronal soft · coronal · 0.35mm/px · 3 of 78 slices shown]
[im 32/78  bone]
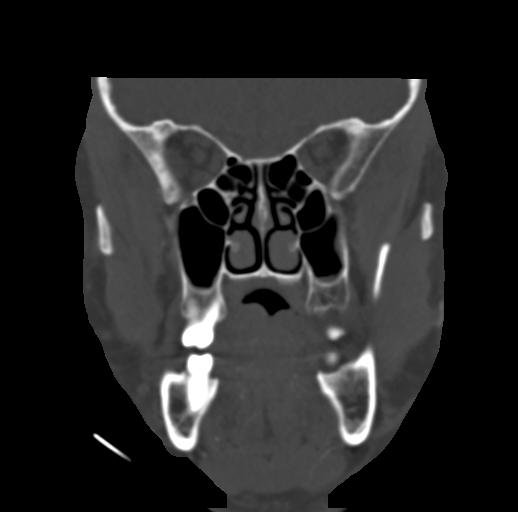
[im 43/78  bone]
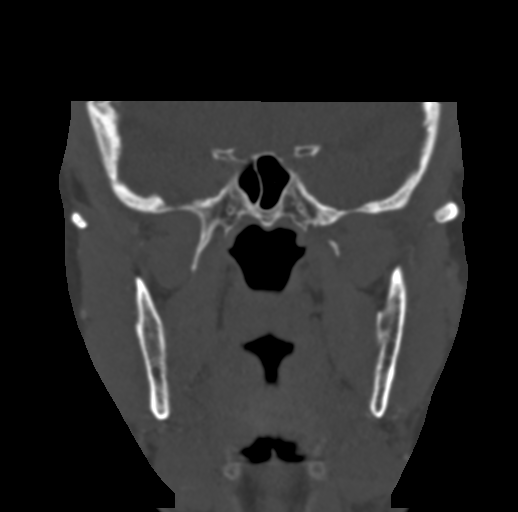
[im 55/78  bone]
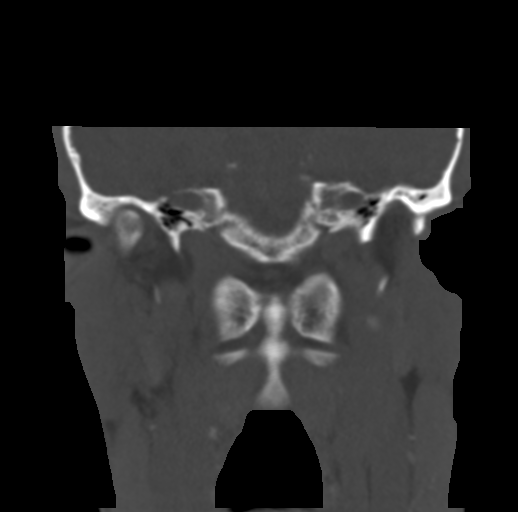

[Series 13: sagittal bone · sagittal · 0.35mm/px · 1 of 77 slices shown]
[im 39/77  bone]
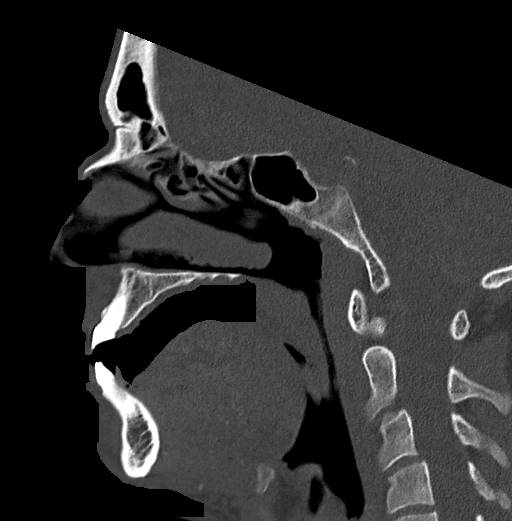

[16 of 47 positions shown; findings below may reference images not displayed]

FINDINGS: CT HEAD FINDINGS

Brain: The ventricles, cisterns and other CSF spaces are normal.
There is no mass, mass effect, shift of midline structures or acute
hemorrhage. Subtle stable hazy low-attenuation over the midline left
posterior parietooccipital cortex. No evidence of acute infarction.

Vascular: No hyperdense vessel or unexpected calcification.

Skull: Normal. Negative for fracture or focal lesion.

Other: None.

CT MAXILLOFACIAL FINDINGS

Osseous: No acute fracture.

Orbits: Negative. No traumatic or inflammatory finding.

Sinuses: Paranasal sinuses are well developed and well aerated
without air-fluid level. Subtle deviation of the nasal septum to the
left.

Soft tissues: Subtle right periorbital laceration.
IMPRESSION: No acute brain injury.

No acute facial bone fracture.

Subtle stable chronic low-attenuation over the midline posterior
left parietal/occipital cortex.

## 2019-12-26 DIAGNOSIS — W19XXXA Unspecified fall, initial encounter: Secondary | ICD-10-CM | POA: Insufficient documentation

## 2019-12-26 DIAGNOSIS — I959 Hypotension, unspecified: Secondary | ICD-10-CM | POA: Insufficient documentation

## 2019-12-29 ENCOUNTER — Other Ambulatory Visit: Payer: Self-pay

## 2019-12-29 ENCOUNTER — Encounter: Payer: Self-pay | Admitting: Emergency Medicine

## 2019-12-29 ENCOUNTER — Emergency Department: Payer: Medicare Other

## 2019-12-29 DIAGNOSIS — D631 Anemia in chronic kidney disease: Secondary | ICD-10-CM | POA: Diagnosis present

## 2019-12-29 DIAGNOSIS — Z20822 Contact with and (suspected) exposure to covid-19: Secondary | ICD-10-CM | POA: Diagnosis present

## 2019-12-29 DIAGNOSIS — Z7901 Long term (current) use of anticoagulants: Secondary | ICD-10-CM

## 2019-12-29 DIAGNOSIS — T8612 Kidney transplant failure: Secondary | ICD-10-CM | POA: Diagnosis present

## 2019-12-29 DIAGNOSIS — G8929 Other chronic pain: Secondary | ICD-10-CM | POA: Diagnosis present

## 2019-12-29 DIAGNOSIS — E876 Hypokalemia: Principal | ICD-10-CM | POA: Diagnosis present

## 2019-12-29 DIAGNOSIS — N1330 Unspecified hydronephrosis: Secondary | ICD-10-CM | POA: Diagnosis present

## 2019-12-29 DIAGNOSIS — N186 End stage renal disease: Secondary | ICD-10-CM | POA: Diagnosis present

## 2019-12-29 DIAGNOSIS — R569 Unspecified convulsions: Secondary | ICD-10-CM | POA: Diagnosis present

## 2019-12-29 DIAGNOSIS — I4891 Unspecified atrial fibrillation: Secondary | ICD-10-CM | POA: Diagnosis present

## 2019-12-29 DIAGNOSIS — I120 Hypertensive chronic kidney disease with stage 5 chronic kidney disease or end stage renal disease: Secondary | ICD-10-CM | POA: Diagnosis present

## 2019-12-29 DIAGNOSIS — F1290 Cannabis use, unspecified, uncomplicated: Secondary | ICD-10-CM | POA: Diagnosis present

## 2019-12-29 DIAGNOSIS — K861 Other chronic pancreatitis: Secondary | ICD-10-CM | POA: Diagnosis present

## 2019-12-29 DIAGNOSIS — Z79899 Other long term (current) drug therapy: Secondary | ICD-10-CM

## 2019-12-29 DIAGNOSIS — Z885 Allergy status to narcotic agent status: Secondary | ICD-10-CM

## 2019-12-29 DIAGNOSIS — I700 Atherosclerosis of aorta: Secondary | ICD-10-CM | POA: Diagnosis present

## 2019-12-29 DIAGNOSIS — K449 Diaphragmatic hernia without obstruction or gangrene: Secondary | ICD-10-CM | POA: Diagnosis present

## 2019-12-29 DIAGNOSIS — Z8673 Personal history of transient ischemic attack (TIA), and cerebral infarction without residual deficits: Secondary | ICD-10-CM

## 2019-12-29 DIAGNOSIS — Z5329 Procedure and treatment not carried out because of patient's decision for other reasons: Secondary | ICD-10-CM | POA: Diagnosis not present

## 2019-12-29 DIAGNOSIS — Z992 Dependence on renal dialysis: Secondary | ICD-10-CM

## 2019-12-29 LAB — BASIC METABOLIC PANEL
Anion gap: 21 — ABNORMAL HIGH (ref 5–15)
BUN: 58 mg/dL — ABNORMAL HIGH (ref 6–20)
CO2: 27 mmol/L (ref 22–32)
Calcium: 9.1 mg/dL (ref 8.9–10.3)
Chloride: 83 mmol/L — ABNORMAL LOW (ref 98–111)
Creatinine, Ser: 7.36 mg/dL — ABNORMAL HIGH (ref 0.61–1.24)
GFR calc Af Amer: 10 mL/min — ABNORMAL LOW (ref >60)
GFR calc non Af Amer: 9 mL/min — ABNORMAL LOW (ref >60)
Glucose, Bld: 101 mg/dL — ABNORMAL HIGH (ref 70–99)
Potassium: 2.9 mmol/L — ABNORMAL LOW (ref 3.5–5.1)
Sodium: 131 mmol/L — ABNORMAL LOW (ref 135–145)

## 2019-12-29 LAB — HEPATIC FUNCTION PANEL
ALT: 13 U/L (ref 0–44)
AST: 29 U/L (ref 15–41)
Albumin: 4 g/dL (ref 3.5–5.0)
Alkaline Phosphatase: 86 U/L (ref 38–126)
Bilirubin, Direct: 0.3 mg/dL — ABNORMAL HIGH (ref 0.0–0.2)
Indirect Bilirubin: 1.7 mg/dL — ABNORMAL HIGH (ref 0.3–0.9)
Total Bilirubin: 2 mg/dL — ABNORMAL HIGH (ref 0.3–1.2)
Total Protein: 7.3 g/dL (ref 6.5–8.1)

## 2019-12-29 LAB — CBC
HCT: 33.7 % — ABNORMAL LOW (ref 39.0–52.0)
Hemoglobin: 12.4 g/dL — ABNORMAL LOW (ref 13.0–17.0)
MCH: 33.7 pg (ref 26.0–34.0)
MCHC: 36.8 g/dL — ABNORMAL HIGH (ref 30.0–36.0)
MCV: 91.6 fL (ref 80.0–100.0)
Platelets: 381 10*3/uL (ref 150–400)
RBC: 3.68 MIL/uL — ABNORMAL LOW (ref 4.22–5.81)
RDW: 13.6 % (ref 11.5–15.5)
WBC: 5.8 10*3/uL (ref 4.0–10.5)
nRBC: 0 % (ref 0.0–0.2)

## 2019-12-29 LAB — TROPONIN I (HIGH SENSITIVITY): Troponin I (High Sensitivity): 14 ng/L (ref <18)

## 2019-12-29 LAB — LIPASE, BLOOD: Lipase: 36 U/L (ref 11–51)

## 2019-12-29 MED ORDER — ACETAMINOPHEN 325 MG PO TABS
ORAL_TABLET | ORAL | Status: AC
  Filled 2019-12-29: qty 2

## 2019-12-29 MED ORDER — ONDANSETRON HCL 4 MG/2ML IJ SOLN
4.0000 mg | Freq: Once | INTRAMUSCULAR | Status: DC
  Filled 2019-12-29: qty 2

## 2019-12-29 MED ORDER — ONDANSETRON 4 MG PO TBDP
4.0000 mg | ORAL_TABLET | Freq: Once | ORAL | Status: AC | PRN
  Administered 2019-12-29: 4 mg via ORAL
  Filled 2019-12-29: qty 1

## 2019-12-29 MED ORDER — ACETAMINOPHEN 325 MG PO TABS
650.0000 mg | ORAL_TABLET | Freq: Once | ORAL | Status: AC
  Administered 2019-12-29: 650 mg via ORAL

## 2019-12-29 MED ORDER — SODIUM CHLORIDE 0.9 % IV BOLUS: 1000.0000 mL | Freq: Once | INTRAVENOUS | Status: DC

## 2019-12-29 NOTE — ED Notes (Addendum)
Pt in hallway requesting pain "twelve out of ten" and nausea meds, first RN notified  Pt oriented to effectiveness window for PO meds and told that he would be roomed once a room is available

## 2019-12-29 NOTE — ED Notes (Signed)
Unable to obtain an IV. Pt states US is usually required.

## 2019-12-29 NOTE — ED Triage Notes (Signed)
Pt reports chest pain since 3 am this morning, woke him up from sleep, and has been vomiting all day repots approximately 10 episodes of emesis. Pt reports pressure in the middle of his chest denies pain radiating to any other part of this body. Pt talks in complete sentences no distress. Pt has feeding tube.

## 2019-12-30 ENCOUNTER — Inpatient Hospital Stay
Admission: EM | Admit: 2019-12-30 | Discharge: 2019-12-30 | DRG: 640 | Discharge status: Against Medical Advice | Payer: Medicare Other | Attending: Internal Medicine | Admitting: Internal Medicine

## 2019-12-30 ENCOUNTER — Emergency Department: Payer: Medicare Other

## 2019-12-30 DIAGNOSIS — R109 Unspecified abdominal pain: Secondary | ICD-10-CM | POA: Insufficient documentation

## 2019-12-30 DIAGNOSIS — R1110 Vomiting, unspecified: Secondary | ICD-10-CM

## 2019-12-30 DIAGNOSIS — Z8673 Personal history of transient ischemic attack (TIA), and cerebral infarction without residual deficits: Secondary | ICD-10-CM | POA: Diagnosis not present

## 2019-12-30 DIAGNOSIS — T8612 Kidney transplant failure: Secondary | ICD-10-CM | POA: Diagnosis present

## 2019-12-30 DIAGNOSIS — R1084 Generalized abdominal pain: Secondary | ICD-10-CM

## 2019-12-30 DIAGNOSIS — I480 Paroxysmal atrial fibrillation: Secondary | ICD-10-CM | POA: Insufficient documentation

## 2019-12-30 DIAGNOSIS — R569 Unspecified convulsions: Secondary | ICD-10-CM | POA: Diagnosis present

## 2019-12-30 DIAGNOSIS — G8929 Other chronic pain: Secondary | ICD-10-CM | POA: Insufficient documentation

## 2019-12-30 DIAGNOSIS — I4891 Unspecified atrial fibrillation: Secondary | ICD-10-CM | POA: Diagnosis present

## 2019-12-30 DIAGNOSIS — K2990 Gastroduodenitis, unspecified, without bleeding: Secondary | ICD-10-CM | POA: Insufficient documentation

## 2019-12-30 DIAGNOSIS — F1290 Cannabis use, unspecified, uncomplicated: Secondary | ICD-10-CM | POA: Diagnosis present

## 2019-12-30 DIAGNOSIS — D631 Anemia in chronic kidney disease: Secondary | ICD-10-CM | POA: Diagnosis not present

## 2019-12-30 DIAGNOSIS — N186 End stage renal disease: Secondary | ICD-10-CM | POA: Diagnosis not present

## 2019-12-30 DIAGNOSIS — K449 Diaphragmatic hernia without obstruction or gangrene: Secondary | ICD-10-CM | POA: Diagnosis present

## 2019-12-30 DIAGNOSIS — Z885 Allergy status to narcotic agent status: Secondary | ICD-10-CM | POA: Diagnosis not present

## 2019-12-30 DIAGNOSIS — Z20822 Contact with and (suspected) exposure to covid-19: Secondary | ICD-10-CM | POA: Diagnosis present

## 2019-12-30 DIAGNOSIS — Z7901 Long term (current) use of anticoagulants: Secondary | ICD-10-CM | POA: Diagnosis not present

## 2019-12-30 DIAGNOSIS — Z8719 Personal history of other diseases of the digestive system: Secondary | ICD-10-CM | POA: Insufficient documentation

## 2019-12-30 DIAGNOSIS — I482 Chronic atrial fibrillation, unspecified: Secondary | ICD-10-CM | POA: Diagnosis not present

## 2019-12-30 DIAGNOSIS — I700 Atherosclerosis of aorta: Secondary | ICD-10-CM | POA: Diagnosis present

## 2019-12-30 DIAGNOSIS — I120 Hypertensive chronic kidney disease with stage 5 chronic kidney disease or end stage renal disease: Secondary | ICD-10-CM | POA: Diagnosis present

## 2019-12-30 DIAGNOSIS — K861 Other chronic pancreatitis: Secondary | ICD-10-CM | POA: Diagnosis present

## 2019-12-30 DIAGNOSIS — N1330 Unspecified hydronephrosis: Secondary | ICD-10-CM | POA: Diagnosis present

## 2019-12-30 DIAGNOSIS — E876 Hypokalemia: Secondary | ICD-10-CM | POA: Diagnosis present

## 2019-12-30 DIAGNOSIS — Z5329 Procedure and treatment not carried out because of patient's decision for other reasons: Secondary | ICD-10-CM | POA: Diagnosis not present

## 2019-12-30 DIAGNOSIS — Z79899 Other long term (current) drug therapy: Secondary | ICD-10-CM | POA: Diagnosis not present

## 2019-12-30 DIAGNOSIS — Z992 Dependence on renal dialysis: Secondary | ICD-10-CM

## 2019-12-30 LAB — URINALYSIS, COMPLETE (UACMP) WITH MICROSCOPIC
Bilirubin Urine: NEGATIVE
Glucose, UA: NEGATIVE mg/dL
Hgb urine dipstick: NEGATIVE
Ketones, ur: NEGATIVE mg/dL
Leukocytes,Ua: NEGATIVE
Nitrite: NEGATIVE
Protein, ur: 100 mg/dL — AB
Specific Gravity, Urine: 1.016 (ref 1.005–1.030)
pH: 6 (ref 5.0–8.0)

## 2019-12-30 LAB — MAGNESIUM: Magnesium: 2.1 mg/dL (ref 1.7–2.4)

## 2019-12-30 LAB — TROPONIN I (HIGH SENSITIVITY): Troponin I (High Sensitivity): 21 ng/L — ABNORMAL HIGH (ref <18)

## 2019-12-30 MED ORDER — APIXABAN 2.5 MG PO TABS
2.5000 mg | ORAL_TABLET | Freq: Two times a day (BID) | ORAL | Status: DC
  Administered 2019-12-30: 2.5 mg via ORAL
  Filled 2019-12-30 (×2): qty 1

## 2019-12-30 MED ORDER — CYCLOSPORINE MODIFIED (NEORAL) 25 MG PO CAPS
75.0000 mg | ORAL_CAPSULE | Freq: Two times a day (BID) | ORAL | Status: DC
  Administered 2019-12-30: 75 mg via ORAL
  Filled 2019-12-30 (×2): qty 3

## 2019-12-30 MED ORDER — SODIUM CHLORIDE 0.9 % IV BOLUS
500.0000 mL | Freq: Once | INTRAVENOUS | Status: AC
  Administered 2019-12-30: 500 mL via INTRAVENOUS

## 2019-12-30 MED ORDER — ACETAMINOPHEN 500 MG PO TABS
1000.00 | ORAL_TABLET | ORAL | Status: DC
Start: 2019-12-28 — End: 2019-12-30

## 2019-12-30 MED ORDER — DM-GG-POT CITRATE-CITRIC ACID PO
1000.00 | ORAL | Status: DC
Start: 2019-12-28 — End: 2019-12-30

## 2019-12-30 MED ORDER — LAMOTRIGINE 25 MG PO TABS
50.0000 mg | ORAL_TABLET | Freq: Every day | ORAL | Status: DC
  Administered 2019-12-30: 50 mg via ORAL
  Filled 2019-12-30: qty 2

## 2019-12-30 MED ORDER — GABAPENTIN 300 MG PO CAPS
300.00 | ORAL_CAPSULE | ORAL | Status: DC
Start: 2019-12-28 — End: 2019-12-30

## 2019-12-30 MED ORDER — PANCRELIPASE (LIP-PROT-AMYL) 24000-76000 UNITS PO CPEP
ORAL_CAPSULE | ORAL | Status: DC
Start: 2019-12-28 — End: 2019-12-30

## 2019-12-30 MED ORDER — HYDROMORPHONE HCL 1 MG/ML IJ SOLN
INTRAMUSCULAR | Status: AC
  Filled 2019-12-30: qty 1

## 2019-12-30 MED ORDER — AZATHIOPRINE 50 MG PO TABS
100.0000 mg | ORAL_TABLET | Freq: Every day | ORAL | Status: DC
  Administered 2019-12-30: 100 mg via ORAL
  Filled 2019-12-30: qty 2

## 2019-12-30 MED ORDER — DULOXETINE HCL 30 MG PO CPEP
30.00 | ORAL_CAPSULE | ORAL | Status: DC
Start: 2019-12-29 — End: 2019-12-30

## 2019-12-30 MED ORDER — LEVETIRACETAM IN NACL 500 MG/100ML IV SOLN
500.0000 mg | Freq: Two times a day (BID) | INTRAVENOUS | Status: DC
  Filled 2019-12-30 (×2): qty 100

## 2019-12-30 MED ORDER — CLONIDINE HCL 0.1 MG PO TABS: 0.3000 mg | ORAL_TABLET | Freq: Three times a day (TID) | ORAL | Status: DC

## 2019-12-30 MED ORDER — SODIUM BICARBONATE 650 MG PO TABS
1300.0000 mg | ORAL_TABLET | Freq: Three times a day (TID) | ORAL | Status: DC
  Filled 2019-12-30 (×3): qty 2

## 2019-12-30 MED ORDER — HISTUSS PD PO
30.00 | ORAL | Status: DC
Start: 2019-12-29 — End: 2019-12-30

## 2019-12-30 MED ORDER — GENERIC EXTERNAL MEDICATION
Status: DC
Start: ? — End: 2019-12-30

## 2019-12-30 MED ORDER — SODIUM CHLORIDE 0.9 % IV SOLN: Freq: Once | INTRAVENOUS | Status: DC

## 2019-12-30 MED ORDER — NUTREN RENAL PO LIQD
2400.00 | ORAL | Status: DC
Start: 2019-12-28 — End: 2019-12-30

## 2019-12-30 MED ORDER — Medication
1000.00 | Status: DC
Start: 2019-12-29 — End: 2019-12-30

## 2019-12-30 MED ORDER — ONDANSETRON HCL 4 MG PO TABS: 4.0000 mg | ORAL_TABLET | Freq: Four times a day (QID) | ORAL | Status: DC | PRN

## 2019-12-30 MED ORDER — PANCRELIPASE (LIP-PROT-AMYL) 12000-38000 UNITS PO CPEP
12000.0000 [IU] | ORAL_CAPSULE | Freq: Three times a day (TID) | ORAL | Status: DC
  Filled 2019-12-30 (×2): qty 1

## 2019-12-30 MED ORDER — MULTILEX-T&M PO TABS
120.00 | ORAL_TABLET | ORAL | Status: DC
Start: 2019-12-29 — End: 2019-12-30

## 2019-12-30 MED ORDER — QC CLEAR STRIPS MISC
100.00 | Status: DC
Start: 2019-12-28 — End: 2019-12-30

## 2019-12-30 MED ORDER — HYDROMORPHONE HCL 2 MG PO TABS
2.00 | ORAL_TABLET | ORAL | Status: DC
Start: ? — End: 2019-12-30

## 2019-12-30 MED ORDER — METER BUFFER PH 4 SOLN
0.25 | Status: DC
Start: 2019-12-29 — End: 2019-12-30

## 2019-12-30 MED ORDER — CARVEDILOL 12.5 MG PO TABS
37.5000 mg | ORAL_TABLET | Freq: Two times a day (BID) | ORAL | Status: DC
  Administered 2019-12-30: 37.5 mg via ORAL
  Filled 2019-12-30 (×2): qty 3

## 2019-12-30 MED ORDER — HYDROMORPHONE HCL 1 MG/ML IJ SOLN
1.0000 mg | INTRAMUSCULAR | Status: DC | PRN
  Administered 2019-12-30: 1 mg via INTRAVENOUS

## 2019-12-30 MED ORDER — GENETUSS-2 10-15-300 MG/5ML PO LIQD
125.00 | ORAL | Status: DC
Start: 2019-12-28 — End: 2019-12-30

## 2019-12-30 MED ORDER — HYDROMORPHONE HCL 1 MG/ML IJ SOLN
1.0000 mg | Freq: Once | INTRAMUSCULAR | Status: AC
  Administered 2019-12-30: 1 mg via INTRAVENOUS
  Filled 2019-12-30: qty 1

## 2019-12-30 MED ORDER — LEFLUNOMIDE 20 MG PO TABS
20.0000 mg | ORAL_TABLET | Freq: Every day | ORAL | Status: DC
  Administered 2019-12-30: 20 mg via ORAL
  Filled 2019-12-30: qty 1

## 2019-12-30 MED ORDER — DIVALPROEX SODIUM 500 MG PO DR TAB
500.0000 mg | DELAYED_RELEASE_TABLET | Freq: Two times a day (BID) | ORAL | Status: DC
  Filled 2019-12-30: qty 1

## 2019-12-30 MED ORDER — AMLODIPINE BESYLATE 5 MG PO TABS
5.0000 mg | ORAL_TABLET | Freq: Every day | ORAL | Status: DC
  Administered 2019-12-30: 5 mg via ORAL
  Filled 2019-12-30: qty 1

## 2019-12-30 MED ORDER — CATALYTIC FORMULA PO
25.00 | ORAL | Status: DC
Start: 2019-12-28 — End: 2019-12-30

## 2019-12-30 MED ORDER — DILTIAZEM HCL 60 MG PO TABS
60.0000 mg | ORAL_TABLET | Freq: Three times a day (TID) | ORAL | Status: DC
  Administered 2019-12-30: 60 mg via ORAL
  Filled 2019-12-30: qty 1

## 2019-12-30 MED ORDER — LACOSAMIDE 50 MG PO TABS
100.0000 mg | ORAL_TABLET | Freq: Two times a day (BID) | ORAL | Status: DC
  Administered 2019-12-30: 100 mg via ORAL
  Filled 2019-12-30: qty 2
  Filled 2019-12-30: qty 1

## 2019-12-30 MED ORDER — POTASSIUM CHLORIDE IN NACL 20-0.9 MEQ/L-% IV SOLN
Freq: Once | INTRAVENOUS | Status: AC
  Filled 2019-12-30: qty 1000

## 2019-12-30 MED ORDER — MIRTAZAPINE 15 MG PO TABS: 7.5000 mg | ORAL_TABLET | Freq: Every day | ORAL | Status: DC

## 2019-12-30 MED ORDER — GABAPENTIN 300 MG PO CAPS
300.0000 mg | ORAL_CAPSULE | Freq: Two times a day (BID) | ORAL | Status: DC
  Administered 2019-12-30: 300 mg via ORAL
  Filled 2019-12-30: qty 1

## 2019-12-30 MED ORDER — TRONOLANE 0.25 % RE SUPP
2.50 | RECTAL | Status: DC
Start: 2019-12-28 — End: 2019-12-30

## 2019-12-30 MED ORDER — ONDANSETRON HCL 4 MG/2ML IJ SOLN: 4.0000 mg | Freq: Four times a day (QID) | INTRAMUSCULAR | Status: DC | PRN

## 2019-12-30 MED ORDER — HYDROMORPHONE HCL 2 MG PO TABS
1.0000 mg | ORAL_TABLET | Freq: Once | ORAL | Status: AC
  Administered 2019-12-30: 1 mg via ORAL
  Filled 2019-12-30: qty 1

## 2019-12-30 MED ORDER — Medication
Status: DC
Start: ? — End: 2019-12-30

## 2019-12-30 MED ORDER — VITAMIN D (ERGOCALCIFEROL) 1.25 MG (50000 UNIT) PO CAPS: 50000.0000 [IU] | ORAL_CAPSULE | ORAL | Status: DC

## 2019-12-30 MED ORDER — ONDANSETRON HCL 4 MG/2ML IJ SOLN
4.00 | INTRAMUSCULAR | Status: DC
Start: ? — End: 2019-12-30

## 2019-12-30 MED ORDER — PREDNISONE 10 MG PO TABS
10.0000 mg | ORAL_TABLET | Freq: Every day | ORAL | Status: DC
  Administered 2019-12-30: 10 mg via ORAL
  Filled 2019-12-30: qty 1

## 2019-12-30 MED ORDER — POTASSIUM CHLORIDE IN NACL 40-0.9 MEQ/L-% IV SOLN
INTRAVENOUS | Status: DC
  Administered 2019-12-30: 50 mL/h via INTRAVENOUS
  Filled 2019-12-30: qty 1000

## 2019-12-30 MED ORDER — HISTACOL DM 30-2-5-50 MG/5ML PO SYRP
100.00 | ORAL_SOLUTION | ORAL | Status: DC
Start: 2019-12-29 — End: 2019-12-30

## 2019-12-30 MED ORDER — ACETAMINOPHEN 500 MG PO TABS: 500.0000 mg | ORAL_TABLET | Freq: Four times a day (QID) | ORAL | Status: DC | PRN

## 2019-12-30 MED ORDER — MIRTAZAPINE 15 MG PO TABS
15.00 | ORAL_TABLET | ORAL | Status: DC
Start: ? — End: 2019-12-30

## 2019-12-30 MED ORDER — DICYCLOMINE HCL 10 MG PO CAPS: 20.0000 mg | ORAL_CAPSULE | Freq: Four times a day (QID) | ORAL | Status: DC | PRN

## 2019-12-30 MED ORDER — GUIATUSS PO
50.00 | ORAL | Status: DC
Start: 2019-12-29 — End: 2019-12-30

## 2019-12-30 MED ORDER — ONDANSETRON 4 MG PO TBDP
4.00 | ORAL_TABLET | ORAL | Status: DC
Start: ? — End: 2019-12-30

## 2019-12-30 MED ORDER — TAMSULOSIN HCL 0.4 MG PO CAPS
0.4000 mg | ORAL_CAPSULE | Freq: Every day | ORAL | Status: DC
  Administered 2019-12-30: 0.4 mg via ORAL
  Filled 2019-12-30: qty 1

## 2019-12-30 MED ORDER — DAMOR DRESSING EX PADS
10.00 | MEDICATED_PAD | CUTANEOUS | Status: DC
Start: 2019-12-29 — End: 2019-12-30

## 2019-12-30 MED ORDER — SPIRONOLACTONE 25 MG PO TABS
25.0000 mg | ORAL_TABLET | Freq: Every day | ORAL | Status: DC
  Administered 2019-12-30: 25 mg via ORAL
  Filled 2019-12-30: qty 1

## 2019-12-30 MED ORDER — ONDANSETRON HCL 4 MG/2ML IJ SOLN
4.0000 mg | Freq: Once | INTRAMUSCULAR | Status: AC
  Administered 2019-12-30: 4 mg via INTRAVENOUS
  Filled 2019-12-30: qty 2

## 2019-12-30 MED ORDER — EQL PEDIATRIC ELECTROLYTE PO SOLN
0.40 | ORAL | Status: DC
Start: 2019-12-29 — End: 2019-12-30

## 2019-12-30 MED ORDER — SODIUM CHLORIDE 0.9 % IV SOLN: Freq: Once | INTRAVENOUS | Status: AC

## 2019-12-30 NOTE — H&P (Signed)
History and Physical    John Cline. RWE:[id number] DOB: 1986/04/25 DOA: 12/30/2019  PCP: Servando Snare, MD   Patient coming from: Home  I have personally briefly reviewed patient's old medical records in [place]  Chief Complaint: Abdominal pain  HPI: [name]. is a 34 y.o. male with medical history significant for history of failed kidney transplant, end-stage renal disease on peritoneal dialysis, chronic pancreatitis,  atrial fibrillation on Eliquis, history of CVA, history of seizures and history of substance use and concern for opioid use disorder. Patient presents to the emergency room for evaluation of abdominal pain which started per patient 36 hours prior to his admission after he finished his peritoneal dialysis.  Pain was mostly in the epigastric and non radiating. He describes the pain as diffuse and rates it a 12 x 10 in intensity at its worst.  It is sharp and associated with several episodes of vomiting, he denies having any diarrhea.  Patient admits to marijuana use prior to this episode.  Patient has not been able to tolerate any oral intake and trial of p.o. was attempted in the ER which patient failed. Patient was recently admitted at [facility] from April 5-8 for acute on chronic abdominal pain, nausea and vomiting.  He had PD fluid cultures done during that hospitalization which were negative. He will be admitted to the hospital for further evaluation  ED Course: Patient with multiple medical problems including chronic pancreatitis who presents to the emergency room for evaluation of persistent abdominal pain associated with emesis and inability to tolerate any oral intake.  Was recently admitted for same at [facility] and work-up at that time was negative.  Patient will be admitted to the hospital for pain control  Review of Systems: As per HPI otherwise 10 point review of systems negative.    Past Medical History:  Diagnosis Date  . Atrial fibrillation  ([place])   . Hypertension   . Seizures ([place])     Past Surgical History:  Procedure Laterality Date  . KIDNEY TRANSPLANT       reports that he has never smoked. He has never used smokeless tobacco. He reports previous alcohol use. No history on file for drug.  Allergies  Allergen Reactions  . Dapsone Other (See Comments)    Methemoglobinemia    Other reaction(s): Other (See Comments) Childhood allergies methemoglobinemia Other reaction(s): Unknown   . Morphine Hives and Rash    Per patient, this is just redness and itching at injection site. Confusion Other reaction(s): Unknown   . Lipase Other (See Comments)    Constipation  Other reaction(s): Other (See Comments) Constipation Other reaction(s): Unknown  . Morphine And Related Rash    No family history on file.   Prior to Admission medications   Medication Sig Start Date End Date Taking? Authorizing Provider  acetaminophen (TYLENOL) 500 MG tablet Take 500 mg by mouth every 6 (six) hours as needed for mild pain or headache.    [provider]  amLODipine (NORVASC) 5 MG tablet Take 5 mg by mouth daily. 04/30/18   [provider]  apixaban (ELIQUIS) 2.5 MG TABS tablet Take 2.5 mg by mouth 2 (two) times daily.    [provider]  azaTHIOprine (IMURAN) 50 MG tablet Take 100 mg by mouth daily.  04/30/18   [provider]  carvedilol (COREG) 25 MG tablet Take 37.5 mg by mouth 2 (two) times daily with a meal.    [provider]  Cholecalciferol (VITAMIN D3) 1.25 MG (50000 UT) CAPS Take 50,000 Units by mouth every 7 (seven) days.    [provider]  cloNIDine (CATAPRES) 0.3 MG tablet Take 0.3 mg by mouth 3 (three) times daily. 03/19/19   [provider]  CREON 24000-76000 units CPEP Take 1-2 capsules by mouth 3 (three) times daily as needed (with meals).  05/11/18   [provider]  cycloSPORINE modified (NEORAL) 25 MG capsule Take 75 mg by mouth 2 (two) times  daily.  05/13/18   [provider]  dicyclomine (BENTYL) 10 MG capsule Take 20 mg by mouth 4 (four) times daily as needed for spasms.  04/30/18   [provider]  diltiazem (CARDIZEM) 60 MG tablet Take 1 tablet (60 mg total) by mouth 3 (three) times daily. 03/31/19   Al Decant, MD  divalproex (DEPAKOTE) 250 MG DR tablet Take 500 mg by mouth 2 (two) times daily. 05/10/18   [provider]  furosemide (LASIX) 40 MG tablet Take 0.5 tablets (20 mg total) by mouth daily. 03/31/19   Al Decant, MD  gabapentin (NEURONTIN) 300 MG capsule Take 300 mg by mouth 2 (two) times daily. 05/03/18   [provider]  lacosamide 100 MG TABS Take 1 tablet (100 mg total) by mouth 2 (two) times daily. 03/31/19   Al Decant, MD  lamoTRIgine (LAMICTAL) 25 MG tablet Take 2 tablets (50 mg total) by mouth daily. 04/01/19   Al Decant, MD  leflunomide (ARAVA) 20 MG tablet Take 20 mg by mouth daily. 05/26/18   [provider]  levETIRAcetam (KEPPRA) 500 MG tablet Take 1 tablet (500 mg total) by mouth 2 (two) times daily. 03/31/19   Al Decant, MD  magnesium oxide (MAG-OX) 400 MG tablet Take 400 mg by mouth daily.    [provider]  mirtazapine (REMERON) 15 MG tablet Take 7.5 mg by mouth at bedtime. 05/26/18   [provider]  ondansetron (ZOFRAN) 8 MG tablet Take 8 mg by mouth every 8 (eight) hours as needed for nausea or vomiting.    [provider]  pantoprazole (PROTONIX) 20 MG tablet Take 20 mg by mouth daily.    [provider]  predniSONE (DELTASONE) 10 MG tablet Take 10 mg by mouth daily. 05/19/18   [provider]  sildenafil (VIAGRA) 50 MG tablet Take 50 mg by mouth daily as needed for erectile dysfunction.    [provider]  sodium bicarbonate 650 MG tablet Take 1,300 mg by mouth 3 (three) times daily.  05/27/18   [provider]  spironolactone (ALDACTONE) 25 MG tablet Take 25 mg by mouth daily. 03/31/18    [provider]  tamsulosin (FLOMAX) 0.4 MG CAPS capsule Take 0.4 mg by mouth daily. 04/30/18   [provider]    Physical Exam: Vitals:   12/30/19 0400 12/30/19 0430 12/30/19 0604 12/30/19 0700  BP: 109/83 118/87 110/77 115/88  Pulse: 69 90 77 92  Resp: 12 13 14 18   Temp:      TempSrc:      SpO2: 100% 100% 98% 100%  Weight:      Height:         Vitals:   12/30/19 0400 12/30/19 0430 12/30/19 0604 12/30/19 0700  BP: 109/83 118/87 110/77 115/88  Pulse: 69 90 77 92  Resp: 12 13 14 18   Temp:      TempSrc:      SpO2: 100% 100% 98% 100%  Weight:      Height:  Constitutional: NAD, alert and oriented x 3 Eyes: PERRL, lids and conjunctivae normal ENMT: Mucous membranes are moist.  Neck: normal, supple, no masses, no thyromegaly Respiratory: clear to auscultation bilaterally, no wheezing, no crackles. Normal respiratory effort. No accessory muscle use.  Cardiovascular: Regular rate and rhythm, no murmurs / rubs / gallops. No extremity edema. 2+ pedal pulses. No carotid bruits.  Abdomen:  Tenderness in the epigastrium (guarding), no masses palpated. No hepatosplenomegaly. Bowel sounds positive. PD catheter in place Musculoskeletal: no clubbing / cyanosis. No joint deformity upper and lower extremities.  Skin: no rashes, lesions, ulcers.  Neurologic: No gross focal neurologic deficit. Psychiatric: Normal mood and affect.   Labs on Admission: I have personally reviewed following labs and imaging studies  CBC: Recent Labs  Lab 12/29/19 2029  WBC 5.8  HGB 12.4*  HCT 33.7*  MCV 91.6  PLT 494   Basic Metabolic Panel: Recent Labs  Lab 12/29/19 2029  NA 131*  K 2.9*  CL 83*  CO2 27  GLUCOSE 101*  BUN 58*  CREATININE 7.36*  CALCIUM 9.1   GFR: Estimated Creatinine Clearance: 10.7 mL/min (A) (by C-G formula based on SCr of 7.36 mg/dL (H)). Liver Function Tests: Recent Labs  Lab 12/29/19 2029  AST 29  ALT 13  ALKPHOS 86  BILITOT 2.0*    PROT 7.3  ALBUMIN 4.0   Recent Labs  Lab 12/29/19 2029  LIPASE 36   No results for input(s): AMMONIA in the last 168 hours. Coagulation Profile: No results for input(s): INR, PROTIME in the last 168 hours. Cardiac Enzymes: No results for input(s): CKTOTAL, CKMB, CKMBINDEX, TROPONINI in the last 168 hours. BNP (last 3 results) No results for input(s): PROBNP in the last 8760 hours. HbA1C: No results for input(s): HGBA1C in the last 72 hours. CBG: No results for input(s): GLUCAP in the last 168 hours. Lipid Profile: No results for input(s): CHOL, HDL, LDLCALC, TRIG, CHOLHDL, LDLDIRECT in the last 72 hours. Thyroid Function Tests: No results for input(s): TSH, T4TOTAL, FREET4, T3FREE, THYROIDAB in the last 72 hours. Anemia Panel: No results for input(s): VITAMINB12, FOLATE, FERRITIN, TIBC, IRON, RETICCTPCT in the last 72 hours. Urine analysis:    Component Value Date/Time   COLORURINE YELLOW (A) 12/30/2019 0552   APPEARANCEUR CLEAR (A) 12/30/2019 0552   APPEARANCEUR Clear 05/25/2014 1509   LABSPEC 1.016 12/30/2019 0552   LABSPEC 1.005 05/25/2014 1509   PHURINE 6.0 12/30/2019 [place] 12/30/2019 0552   GLUCOSEU Negative 05/25/2014 1509   HGBUR NEGATIVE 12/30/2019 0552   BILIRUBINUR NEGATIVE 12/30/2019 0552   BILIRUBINUR Negative 05/25/2014 [place] NEGATIVE 12/30/2019 0552   PROTEINUR 100 (A) 12/30/2019 0552   NITRITE NEGATIVE 12/30/2019 0552   LEUKOCYTESUR NEGATIVE 12/30/2019 0552   LEUKOCYTESUR Negative 05/25/2014 1509    Radiological Exams on Admission: CT Abdomen Pelvis Wo Contrast  Result Date: 12/30/2019 CLINICAL DATA:  Chest pain, vomiting EXAM: CT ABDOMEN AND PELVIS WITHOUT CONTRAST TECHNIQUE: Multidetector CT imaging of the abdomen and pelvis was performed following the standard protocol without IV contrast. COMPARISON:  03/26/2019 FINDINGS: Lower chest: No acute pleural or parenchymal lung disease. Small hiatal hernia again noted.  Hepatobiliary: Attenuation material within the gallbladder is likely biliary sludge. No evidence of cholecystitis. The liver is unremarkable. Pancreas: Unremarkable. No pancreatic ductal dilatation or surrounding inflammatory changes. Spleen: Normal in size without focal abnormality. Adrenals/Urinary Tract: Native kidneys are surgically absent. Transplant kidney left lower quadrant with moderate hydronephrosis. No urinary tract calculi. Bladder is  moderately distended without focal abnormality. Stomach/Bowel: There is no bowel obstruction or ileus. Moderate retained stool within the colon. Vascular/Lymphatic: Minimal atherosclerosis of the aorta and its branches. No pathologic adenopathy. Reproductive: Prostate is unremarkable. Other: Trace free fluid is seen within the upper abdomen. This could be related to peritoneal dialysis, as a Tenckhoff catheter is seen coiled within the lower pelvis. Musculoskeletal: There are no acute or destructive bony lesions. Reconstructed images demonstrate no additional findings. IMPRESSION: 1. Left lower quadrant transplant kidney, with moderate hydronephrosis. 2. Trace free fluid within the upper abdomen, which may be related to peritoneal dialysis. 3. Small hiatal hernia. 4.  Aortic Atherosclerosis (ICD10-I70.0). Electronically Signed   By: Randa Ngo M.D.   On: 12/30/2019 02:45   DG Chest 2 View  Result Date: 12/29/2019 CLINICAL DATA:  Chest pain since 3 a.m. today. EXAM: CHEST - 2 VIEW COMPARISON:  03/26/2019 FINDINGS: Normal sized heart. Clear lungs with normal vascularity. Unremarkable bones. Upper abdominal surgical clips. IMPRESSION: No acute abnormality. Electronically Signed   By: Claudie Revering M.D.   On: 12/29/2019 20:27    EKG: Independently reviewed.  Atrial flutter   Assessment/Plan Principal Problem:   Abdominal pain Active Problems:   Anemia in chronic renal disease   ESRD (end stage renal disease) (HCC)   Atrial fibrillation (HCC)   Hypokalemia     Abdominal pain Most likely secondary to acute on chronic pancreatitis Patient with a history of chronic pancreatitis who presents to the emergency room for evaluation of worsening abdominal pain associated with emesis Patient admits to cannabinoid use ??  Cannabinoid hyperemesis syndrome We will keep patient n.p.o. except for meds if he is able to tolerate Pain control   End-stage renal disease on peritoneal dialysis We will request nephrology consult   Hypokalemia secondary to GI loss Supplement potassium Obtain magnesium levels   History of atrial fibrillation/atrial flutter  Continue Cardizem and Coreg for rate control Continue Eliquis   Anemia of chronic disease H/H is stable   Hypertension Patient is normotensive Hold Clonidine Holding parameters for other antihypertensive medications   S/p Failed renal transplant Continue immunosuppressive therapy Continue cyclosporine, azathioprine and prednisone   History of seizures Place patient on IV Keppra since he is unable to tolerate oral intake Seizure precautions   DVT prophylaxis: Eliquis Code Status: Full Family Communication: Plan of care was discussed with patient in detail. He verbalizes understanding and agrees with the plan Disposition Plan: Back to previous home environment Consults called: Nephrology    Collier Bullock MD [facility]     12/30/2019, 8:27 AM

## 2019-12-30 NOTE — Progress Notes (Signed)
Patient called ED operator and let her know he "would like to be transferred to a different hospital" because "this is taking too long." MD aware. Will give one time dose of dilaudid as ordered.

## 2019-12-30 NOTE — Progress Notes (Signed)
Patient repeatedly asking to have pain medication and to see his doctor or the charge nurse. Both Dr. Francine Graven and Velna Hatchet (charge nurse) were made aware. Dr. Francine Graven unable to get to bedside at this time, but did put in an order for dilaudid as a one time dose. Patient was given this dose at 1405. Patient continued to scream into the hallway for a doctor. Dr. Francine Graven notified again, and that the patient wanted to be transferred to [facility]. Dr. Francine Graven was also notified the patient wanted to leave AMA, and she responded he could sign out. Patient signed AMA form with Claretta Fraise, RN. Patient became increasingly boisterous and rude to Claretta Fraise, cursing at her and stating "every person in this place is lazy, nobody will do anything for me!" Security called to the bedside. At this time, patient ripped off his telemetry leads and ripped out his IV, using the blanket from the bed to hold pressure on the IV site. Patient then exited the building shirtless with security following.

## 2019-12-30 NOTE — ED Notes (Signed)
Pt woken to given PO trial (water)

## 2019-12-30 NOTE — ED Notes (Addendum)
Pt requesting transfer to [facility] for peritoneal dialysis "I don't that four time a day"  Pt to CT att

## 2019-12-30 NOTE — Progress Notes (Signed)
Patient would not allow staff to check his vital signs before he left.

## 2019-12-30 NOTE — ED Notes (Signed)
Pt very irate wanting to be transferred and talk to his Dr or the charge nurse. After talking to both, the pt was informed they would come see him when they had a moment. This was not good enough for the pt and he became very rude to both me and Scientist, clinical (histocompatibility and immunogenetics) by saying "we're speaking to him like he's a retard", and "Ive been through all this before, I know what's going on." I responded very calmly saying I wasn't speaking to him any specific way and I was trying to help but he wanted to speak to an RN about getting transferred. Then Anderson Malta RN came over to try and help but the pt had only been getting angrier and decided to leave AMA.

## 2019-12-30 NOTE — ED Notes (Signed)
Pt resting in bed with eyes closed in NAD

## 2019-12-30 NOTE — ED Provider Notes (Signed)
[facility] Emergency Department Provider Note  ____________________________________________   First MD Initiated Contact with Patient 12/30/19 0139     (approximate)  I have reviewed the triage vital signs and the nursing notes.  History  Chief Complaint Chest Pain and Emesis    HPI John Cline. is a 34 y.o. male with hx of kidney transplant, ESRD on PD, chronic pancreatitis, chronic abdominal pain, HTN, AF on Eliquis, substance use and concern for opioid use disorder, seizure, stroke. Patient's care centered at [facility].   Patient presents to the ED for acute on chronic abdominal pain. States symptoms started this afternoon/evening after running his PD. Pain is severe and diffuse. States it is 12 out of 10 in severity. Sharp, pressure. Associated with several episodes of vomiting. No alleviating or aggravating components. No diarrhea. He states he presented to [facility] because he was worried he was going to pass out due to his symptoms and didn't think he could make it to [facility] in time. He states he has not been able to take or tolerate any of his home medications due to his symptoms.   He is specifically requesting IV narcotics and states he is allergic to morphine.   On chart review, patient was recently admitted from 4/5-4/8 at [facility] for acute on chronic abdominal pain, nausea/vomiting. Had PD cultures done, which were negative.   Past Medical Hx Past Medical History:  Diagnosis Date  . Atrial fibrillation ([place])   . Hypertension   . Seizures [facility])     Problem List Patient Active Problem List   Diagnosis Date Noted  . Hyponatremia 03/26/2019    Past Surgical Hx Past Surgical History:  Procedure Laterality Date  . KIDNEY TRANSPLANT      Medications Prior to Admission medications   Medication Sig Start Date End Date Taking? Authorizing Provider  acetaminophen (TYLENOL) 500 MG tablet Take 500 mg by mouth every 6 (six) hours as needed for mild  pain or headache.    [provider]  amLODipine (NORVASC) 5 MG tablet Take 5 mg by mouth daily. 04/30/18   [provider]  apixaban (ELIQUIS) 2.5 MG TABS tablet Take 2.5 mg by mouth 2 (two) times daily.    [provider]  azaTHIOprine (IMURAN) 50 MG tablet Take 100 mg by mouth daily.  04/30/18   [provider]  carvedilol (COREG) 25 MG tablet Take 37.5 mg by mouth 2 (two) times daily with a meal.    [provider]  Cholecalciferol (VITAMIN D3) 1.25 MG (50000 UT) CAPS Take 50,000 Units by mouth every 7 (seven) days.    [provider]  cloNIDine (CATAPRES) 0.3 MG tablet Take 0.3 mg by mouth 3 (three) times daily. 03/19/19   [provider]  CREON 24000-76000 units CPEP Take 1-2 capsules by mouth 3 (three) times daily as needed (with meals).  05/11/18   [provider]  cycloSPORINE modified (NEORAL) 25 MG capsule Take 75 mg by mouth 2 (two) times daily.  05/13/18   [provider]  dicyclomine (BENTYL) 10 MG capsule Take 20 mg by mouth 4 (four) times daily as needed for spasms.  04/30/18   [provider]  diltiazem (CARDIZEM) 60 MG tablet Take 1 tablet (60 mg total) by mouth 3 (three) times daily. 03/31/19   Al Decant, MD  divalproex (DEPAKOTE) 250 MG DR tablet Take 500 mg by mouth 2 (two) times daily. 05/10/18   [provider]  furosemide (LASIX) 40 MG  tablet Take 0.5 tablets (20 mg total) by mouth daily. 03/31/19   Al Decant, MD  gabapentin (NEURONTIN) 300 MG capsule Take 300 mg by mouth 2 (two) times daily. 05/03/18   [provider]  lacosamide 100 MG TABS Take 1 tablet (100 mg total) by mouth 2 (two) times daily. 03/31/19   Al Decant, MD  lamoTRIgine (LAMICTAL) 25 MG tablet Take 2 tablets (50 mg total) by mouth daily. 04/01/19   Al Decant, MD  leflunomide (ARAVA) 20 MG tablet Take 20 mg by mouth daily. 05/26/18   [provider]  levETIRAcetam (KEPPRA) 500 MG tablet  Take 1 tablet (500 mg total) by mouth 2 (two) times daily. 03/31/19   Al Decant, MD  magnesium oxide (MAG-OX) 400 MG tablet Take 400 mg by mouth daily.    [provider]  mirtazapine (REMERON) 15 MG tablet Take 7.5 mg by mouth at bedtime. 05/26/18   [provider]  ondansetron (ZOFRAN) 8 MG tablet Take 8 mg by mouth every 8 (eight) hours as needed for nausea or vomiting.    [provider]  pantoprazole (PROTONIX) 20 MG tablet Take 20 mg by mouth daily.    [provider]  predniSONE (DELTASONE) 10 MG tablet Take 10 mg by mouth daily. 05/19/18   [provider]  sildenafil (VIAGRA) 50 MG tablet Take 50 mg by mouth daily as needed for erectile dysfunction.    [provider]  sodium bicarbonate 650 MG tablet Take 1,300 mg by mouth 3 (three) times daily.  05/27/18   [provider]  spironolactone (ALDACTONE) 25 MG tablet Take 25 mg by mouth daily. 03/31/18   [provider]  tamsulosin (FLOMAX) 0.4 MG CAPS capsule Take 0.4 mg by mouth daily. 04/30/18   [provider]    Allergies Dapsone, Lipase, and Morphine and related  Family Hx No family history on file.  Social Hx Social History   Tobacco Use  . Smoking status: Never Smoker  . Smokeless tobacco: Never Used  Substance Use Topics  . Alcohol use: Not Currently  . Drug use: Not on file     Review of Systems  Constitutional: Negative for fever. Negative for chills. Eyes: Negative for visual changes. ENT: Negative for sore throat. Cardiovascular: Negative for chest pain. Respiratory: Negative for shortness of breath. Gastrointestinal: + abdominal pain, vomiting Genitourinary: Negative for dysuria. Musculoskeletal: Negative for leg swelling. Skin: Negative for rash. Neurological: Negative for headaches.   Physical Exam  Vital Signs: ED Triage Vitals  Enc Vitals Group     BP 12/29/19 1955 (!) 129/94     Pulse Rate 12/29/19 1955 96     Resp  12/29/19 1955 (!) 21     Temp 12/29/19 1955 97.8 F (36.6 C)     Temp Source 12/29/19 1955 Oral     SpO2 12/29/19 1955 97 %     Weight 12/29/19 1957 120 lb (54.4 kg)     Height 12/29/19 1957 6' (1.829 m)     Head Circumference --      Peak Flow --      Pain Score 12/29/19 1957 10     Pain Loc --      Pain Edu? --      Excl. in [place]? --     Constitutional: Alert and oriented. Chronically ill appearing. Appears older than stated age.  Head: Normocephalic. Atraumatic. Eyes: Conjunctivae clear. Sclera anicteric. Pupils equal and symmetric. Nose: No masses or lesions. No congestion or rhinorrhea.  Mouth/Throat: MM dry.  Neck: No stridor. Trachea midline.  Cardiovascular: Normal rate, regular rhythm. Extremities well perfused. Respiratory: Normal respiratory effort.  Lungs CTAB. Gastrointestinal: Soft. Non-distended. PD catheter in place. States diffuse tenderness even just with auscultation of abdomen. Anticipatory guarding. BS present.  Genitourinary: Deferred. Musculoskeletal: No lower extremity edema. No deformities. Neurologic:  Normal speech and language. No gross focal or lateralizing neurologic deficits are appreciated.  Skin: Skin is warm, dry and intact. No rash noted. Psychiatric: Mood and affect are appropriate for situation.  EKG  Personally reviewed and interpreted by myself.   Date: 12/29/19 Time: 1949 Rate: 106 Rhythm: AF with variable block Axis: normal Intervals: QRS 100 ms, QTc 496 ms AF w/ variable blcok PVCs No STEMI    Radiology  Personally reviewed available imaging myself.   CXR IMPRESSION:  No acute abnormality.   CT A/P IMPRESSION:  1. Left lower quadrant transplant kidney, with moderate  hydronephrosis.  2. Trace free fluid within the upper abdomen, which may be related  to peritoneal dialysis.  3. Small hiatal hernia.  4. Aortic Atherosclerosis (ICD10-I70.0).    Procedures  Procedure(s) performed (including critical  care):  Procedures   Initial Impression / Assessment and Plan / MDM / ED Course  34 y.o. male with PMHx as above who presents to the ED for acute on chronic abdominal pain, vomiting  Ddx: acute on chronic abdominal pain, pancreatitis, drug seeking vs opioid withdrawal. Less likely peritonitis from PD as he is afebrile, HDS, no leukocytosis and additionally symptoms consistent with prior episodes of pain, recently admitted at [facility] for same with negative PD cultures.   Will plan for labs, imaging, symptom control and reassess  CT negative for any acute abnormalities.  Labs reveal mildly decreased sodium, potassium, chloride, consistent with volume losses.  Receiving IV fluids and IV pain control and nausea control and reassess.   Clinical Course as of Dec 29 716  Sat Dec 30, 2019  0500 Patient resting comfortably. Will PO challenge and reassess.   [SM]  [phone] Patient reports persistent pain and no improvement with PO medication and inability to tolerate any further PO. He has multiple medication that require adherence, including his anti-epileptics and anticoagulation. As such, will plan to admit for symptom control.    [SM]    Clinical Course User Index [SM] Lilia Pro., MD     _______________________________   As part of my medical decision making I have reviewed available labs, radiology tests, reviewed old records/chart review.    Final Clinical Impression(s) / ED Diagnosis  Final diagnoses:  Generalized abdominal pain  Vomiting in adult       Note:  This document was prepared using Dragon voice recognition software and may include unintentional dictation errors.   Lilia Pro., MD 12/30/19 [phone]

## 2019-12-30 NOTE — Progress Notes (Signed)
231mL Fluid bolus given r/t hypotension; BP back up to 90/63. Patient states "you're killing me holding my pain medicine. People have been lying to me all day about when I can have it." Writer explained patient could not have IV dilaudid at this time r/t his hypotension. Patient requested another fluid bolus, states pain 10/10 mid upper abdomen. Dr. Francine Graven aware.

## 2019-12-30 NOTE — ED Notes (Signed)
Sent pharmacy a message to verify meds

## 2020-01-01 ENCOUNTER — Inpatient Hospital Stay
Admission: EM | Admit: 2020-01-01 | Discharge: 2020-01-04 | DRG: 438 | Disposition: A | Discharge status: Home / Self Care | Payer: Medicare Other | Attending: Internal Medicine | Admitting: Internal Medicine

## 2020-01-01 ENCOUNTER — Other Ambulatory Visit: Payer: Self-pay

## 2020-01-01 ENCOUNTER — Emergency Department: Payer: Medicare Other

## 2020-01-01 ENCOUNTER — Encounter: Payer: Self-pay | Admitting: Emergency Medicine

## 2020-01-01 DIAGNOSIS — Z992 Dependence on renal dialysis: Secondary | ICD-10-CM

## 2020-01-01 DIAGNOSIS — D84821 Immunodeficiency due to drugs: Secondary | ICD-10-CM | POA: Diagnosis present

## 2020-01-01 DIAGNOSIS — N1330 Unspecified hydronephrosis: Secondary | ICD-10-CM | POA: Diagnosis present

## 2020-01-01 DIAGNOSIS — Z888 Allergy status to other drugs, medicaments and biological substances status: Secondary | ICD-10-CM

## 2020-01-01 DIAGNOSIS — Z885 Allergy status to narcotic agent status: Secondary | ICD-10-CM

## 2020-01-01 DIAGNOSIS — R1013 Epigastric pain: Secondary | ICD-10-CM

## 2020-01-01 DIAGNOSIS — K859 Acute pancreatitis without necrosis or infection, unspecified: Secondary | ICD-10-CM | POA: Diagnosis not present

## 2020-01-01 DIAGNOSIS — I482 Chronic atrial fibrillation, unspecified: Secondary | ICD-10-CM | POA: Diagnosis not present

## 2020-01-01 DIAGNOSIS — Z20822 Contact with and (suspected) exposure to covid-19: Secondary | ICD-10-CM | POA: Diagnosis present

## 2020-01-01 DIAGNOSIS — I4892 Unspecified atrial flutter: Secondary | ICD-10-CM | POA: Diagnosis present

## 2020-01-01 DIAGNOSIS — E876 Hypokalemia: Secondary | ICD-10-CM | POA: Diagnosis present

## 2020-01-01 DIAGNOSIS — T8619 Other complication of kidney transplant: Secondary | ICD-10-CM | POA: Diagnosis present

## 2020-01-01 DIAGNOSIS — Z79899 Other long term (current) drug therapy: Secondary | ICD-10-CM

## 2020-01-01 DIAGNOSIS — I4891 Unspecified atrial fibrillation: Secondary | ICD-10-CM | POA: Diagnosis present

## 2020-01-01 DIAGNOSIS — G8929 Other chronic pain: Secondary | ICD-10-CM | POA: Diagnosis present

## 2020-01-01 DIAGNOSIS — F1290 Cannabis use, unspecified, uncomplicated: Secondary | ICD-10-CM | POA: Diagnosis present

## 2020-01-01 DIAGNOSIS — Z8673 Personal history of transient ischemic attack (TIA), and cerebral infarction without residual deficits: Secondary | ICD-10-CM

## 2020-01-01 DIAGNOSIS — Z7901 Long term (current) use of anticoagulants: Secondary | ICD-10-CM

## 2020-01-01 DIAGNOSIS — K861 Other chronic pancreatitis: Secondary | ICD-10-CM | POA: Diagnosis not present

## 2020-01-01 DIAGNOSIS — N2581 Secondary hyperparathyroidism of renal origin: Secondary | ICD-10-CM | POA: Diagnosis present

## 2020-01-01 DIAGNOSIS — F32A Depression, unspecified: Secondary | ICD-10-CM | POA: Diagnosis present

## 2020-01-01 DIAGNOSIS — N186 End stage renal disease: Secondary | ICD-10-CM

## 2020-01-01 DIAGNOSIS — R109 Unspecified abdominal pain: Secondary | ICD-10-CM | POA: Diagnosis not present

## 2020-01-01 DIAGNOSIS — K8681 Exocrine pancreatic insufficiency: Secondary | ICD-10-CM | POA: Diagnosis present

## 2020-01-01 DIAGNOSIS — F329 Major depressive disorder, single episode, unspecified: Secondary | ICD-10-CM | POA: Diagnosis present

## 2020-01-01 DIAGNOSIS — G40909 Epilepsy, unspecified, not intractable, without status epilepticus: Secondary | ICD-10-CM

## 2020-01-01 DIAGNOSIS — D631 Anemia in chronic kidney disease: Secondary | ICD-10-CM | POA: Diagnosis present

## 2020-01-01 DIAGNOSIS — I120 Hypertensive chronic kidney disease with stage 5 chronic kidney disease or end stage renal disease: Secondary | ICD-10-CM | POA: Diagnosis present

## 2020-01-01 DIAGNOSIS — R112 Nausea with vomiting, unspecified: Secondary | ICD-10-CM

## 2020-01-01 DIAGNOSIS — I10 Essential (primary) hypertension: Secondary | ICD-10-CM | POA: Diagnosis not present

## 2020-01-01 DIAGNOSIS — Z7952 Long term (current) use of systemic steroids: Secondary | ICD-10-CM

## 2020-01-01 DIAGNOSIS — Z940 Kidney transplant status: Secondary | ICD-10-CM

## 2020-01-01 DIAGNOSIS — Y830 Surgical operation with transplant of whole organ as the cause of abnormal reaction of the patient, or of later complication, without mention of misadventure at the time of the procedure: Secondary | ICD-10-CM | POA: Diagnosis present

## 2020-01-01 DIAGNOSIS — Z7682 Awaiting organ transplant status: Secondary | ICD-10-CM

## 2020-01-01 LAB — COMPREHENSIVE METABOLIC PANEL
ALT: 15 U/L (ref 0–44)
AST: 34 U/L (ref 15–41)
Albumin: 3.5 g/dL (ref 3.5–5.0)
Alkaline Phosphatase: 76 U/L (ref 38–126)
Anion gap: 24 — ABNORMAL HIGH (ref 5–15)
BUN: 63 mg/dL — ABNORMAL HIGH (ref 6–20)
CO2: 27 mmol/L (ref 22–32)
Calcium: 9 mg/dL (ref 8.9–10.3)
Chloride: 80 mmol/L — ABNORMAL LOW (ref 98–111)
Creatinine, Ser: 7.55 mg/dL — ABNORMAL HIGH (ref 0.61–1.24)
GFR calc Af Amer: 10 mL/min — ABNORMAL LOW (ref >60)
GFR calc non Af Amer: 8 mL/min — ABNORMAL LOW (ref >60)
Glucose, Bld: 93 mg/dL (ref 70–99)
Potassium: 3.1 mmol/L — ABNORMAL LOW (ref 3.5–5.1)
Sodium: 131 mmol/L — ABNORMAL LOW (ref 135–145)
Total Bilirubin: 0.8 mg/dL (ref 0.3–1.2)
Total Protein: 6.3 g/dL — ABNORMAL LOW (ref 6.5–8.1)

## 2020-01-01 LAB — CBC
HCT: 33.8 % — ABNORMAL LOW (ref 39.0–52.0)
Hemoglobin: 11 g/dL — ABNORMAL LOW (ref 13.0–17.0)
MCH: 33.7 pg (ref 26.0–34.0)
MCHC: 32.5 g/dL (ref 30.0–36.0)
MCV: 103.7 fL — ABNORMAL HIGH (ref 80.0–100.0)
Platelets: 262 10*3/uL (ref 150–400)
RBC: 3.26 MIL/uL — ABNORMAL LOW (ref 4.22–5.81)
RDW: 13.3 % (ref 11.5–15.5)
WBC: 8.3 10*3/uL (ref 4.0–10.5)
nRBC: 0 % (ref 0.0–0.2)

## 2020-01-01 LAB — RESPIRATORY PANEL BY RT PCR (FLU A&B, COVID)
Influenza A by PCR: NEGATIVE
Influenza B by PCR: NEGATIVE
SARS Coronavirus 2 by RT PCR: NEGATIVE

## 2020-01-01 LAB — TROPONIN I (HIGH SENSITIVITY)
Troponin I (High Sensitivity): 18 ng/L — ABNORMAL HIGH (ref <18)
Troponin I (High Sensitivity): 24 ng/L — ABNORMAL HIGH (ref <18)

## 2020-01-01 LAB — LIPASE, BLOOD: Lipase: 59 U/L — ABNORMAL HIGH (ref 11–51)

## 2020-01-01 MED ORDER — PROMETHAZINE HCL 25 MG/ML IJ SOLN
25.0000 mg | Freq: Once | INTRAMUSCULAR | Status: AC
  Administered 2020-01-01: 25 mg via INTRAVENOUS
  Filled 2020-01-01: qty 1

## 2020-01-01 MED ORDER — PREDNISONE 20 MG PO TABS
10.0000 mg | ORAL_TABLET | Freq: Every day | ORAL | Status: DC
  Administered 2020-01-01 – 2020-01-04 (×4): 10 mg via ORAL
  Filled 2020-01-01 (×4): qty 1

## 2020-01-01 MED ORDER — HYDROMORPHONE HCL 1 MG/ML IJ SOLN
2.0000 mg | Freq: Once | INTRAMUSCULAR | Status: AC
  Administered 2020-01-01: 2 mg via INTRAVENOUS
  Filled 2020-01-01: qty 2

## 2020-01-01 MED ORDER — CARVEDILOL 25 MG PO TABS
37.5000 mg | ORAL_TABLET | Freq: Two times a day (BID) | ORAL | Status: DC
  Administered 2020-01-02 – 2020-01-04 (×5): 37.5 mg via ORAL
  Filled 2020-01-01 (×3): qty 1
  Filled 2020-01-01: qty 2
  Filled 2020-01-01: qty 1

## 2020-01-01 MED ORDER — CYCLOSPORINE 100 MG PO CAPS
100.0000 mg | ORAL_CAPSULE | Freq: Every morning | ORAL | Status: DC
  Administered 2020-01-02 – 2020-01-04 (×3): 100 mg via ORAL
  Filled 2020-01-01 (×3): qty 1

## 2020-01-01 MED ORDER — GABAPENTIN 300 MG PO CAPS
300.0000 mg | ORAL_CAPSULE | Freq: Two times a day (BID) | ORAL | Status: DC
  Administered 2020-01-01 – 2020-01-04 (×6): 300 mg via ORAL
  Filled 2020-01-01 (×6): qty 1

## 2020-01-01 MED ORDER — HYDROMORPHONE HCL 1 MG/ML IJ SOLN
2.0000 mg | INTRAMUSCULAR | Status: DC | PRN
  Administered 2020-01-01 – 2020-01-03 (×9): 2 mg via INTRAVENOUS
  Filled 2020-01-01 (×11): qty 2

## 2020-01-01 MED ORDER — LAMOTRIGINE 100 MG PO TABS
100.0000 mg | ORAL_TABLET | Freq: Every day | ORAL | Status: DC
  Administered 2020-01-01 – 2020-01-04 (×4): 100 mg via ORAL
  Filled 2020-01-01 (×4): qty 1

## 2020-01-01 MED ORDER — APIXABAN 2.5 MG PO TABS
2.5000 mg | ORAL_TABLET | Freq: Two times a day (BID) | ORAL | Status: DC
  Administered 2020-01-01 – 2020-01-04 (×6): 2.5 mg via ORAL
  Filled 2020-01-01 (×7): qty 1

## 2020-01-01 MED ORDER — HYOSCYAMINE SULFATE 0.125 MG PO TABS
0.1250 mg | ORAL_TABLET | ORAL | Status: DC | PRN
  Filled 2020-01-01: qty 1

## 2020-01-01 MED ORDER — SEVELAMER CARBONATE 800 MG PO TABS
2400.0000 mg | ORAL_TABLET | Freq: Three times a day (TID) | ORAL | Status: DC
  Administered 2020-01-01 – 2020-01-03 (×3): 2400 mg via ORAL
  Filled 2020-01-01 (×6): qty 3

## 2020-01-01 MED ORDER — VITAMIN B-12 1000 MCG PO TABS
1000.0000 ug | ORAL_TABLET | Freq: Every day | ORAL | Status: DC
  Administered 2020-01-01 – 2020-01-04 (×4): 1000 ug via ORAL
  Filled 2020-01-01 (×4): qty 1

## 2020-01-01 MED ORDER — ACETAMINOPHEN 500 MG PO TABS
500.0000 mg | ORAL_TABLET | Freq: Four times a day (QID) | ORAL | Status: DC | PRN
  Administered 2020-01-01: 1000 mg via ORAL
  Filled 2020-01-01: qty 2

## 2020-01-01 MED ORDER — HEPARIN 1000 UNIT/ML FOR PERITONEAL DIALYSIS
500.0000 [IU] | INTRAMUSCULAR | Status: DC | PRN
  Filled 2020-01-01: qty 0.5

## 2020-01-01 MED ORDER — TAMSULOSIN HCL 0.4 MG PO CAPS
0.4000 mg | ORAL_CAPSULE | Freq: Every day | ORAL | Status: DC
  Administered 2020-01-01 – 2020-01-04 (×4): 0.4 mg via ORAL
  Filled 2020-01-01 (×4): qty 1

## 2020-01-01 MED ORDER — ONDANSETRON HCL 4 MG PO TABS: 4.0000 mg | ORAL_TABLET | Freq: Four times a day (QID) | ORAL | Status: DC | PRN

## 2020-01-01 MED ORDER — ASPIRIN EC 81 MG PO TBEC
81.0000 mg | DELAYED_RELEASE_TABLET | Freq: Every day | ORAL | Status: DC
  Administered 2020-01-01: 81 mg via ORAL
  Filled 2020-01-01 (×4): qty 1

## 2020-01-01 MED ORDER — HYDROMORPHONE HCL 1 MG/ML IJ SOLN
1.0000 mg | Freq: Once | INTRAMUSCULAR | Status: AC
  Administered 2020-01-01: 1 mg via INTRAVENOUS
  Filled 2020-01-01: qty 1

## 2020-01-01 MED ORDER — LACOSAMIDE 50 MG PO TABS
100.0000 mg | ORAL_TABLET | Freq: Two times a day (BID) | ORAL | Status: DC
  Administered 2020-01-01 – 2020-01-04 (×6): 100 mg via ORAL
  Filled 2020-01-01 (×6): qty 2

## 2020-01-01 MED ORDER — PANCRELIPASE (LIP-PROT-AMYL) 12000-38000 UNITS PO CPEP
24000.0000 [IU] | ORAL_CAPSULE | Freq: Three times a day (TID) | ORAL | Status: DC | PRN
  Filled 2020-01-01: qty 2

## 2020-01-01 MED ORDER — CYCLOSPORINE 25 MG PO CAPS
75.0000 mg | ORAL_CAPSULE | Freq: Every day | ORAL | Status: DC
  Administered 2020-01-02 – 2020-01-03 (×2): 75 mg via ORAL
  Filled 2020-01-01 (×4): qty 3

## 2020-01-01 MED ORDER — MIRTAZAPINE 15 MG PO TABS
7.5000 mg | ORAL_TABLET | Freq: Every day | ORAL | Status: DC
  Administered 2020-01-02 – 2020-01-03 (×2): 7.5 mg via ORAL
  Filled 2020-01-01: qty 1
  Filled 2020-01-01: qty 0.5
  Filled 2020-01-01 (×2): qty 1

## 2020-01-01 MED ORDER — GENTAMICIN SULFATE 0.1 % EX CREA
1.0000 "application " | TOPICAL_CREAM | Freq: Every day | CUTANEOUS | Status: DC
  Administered 2020-01-01 – 2020-01-02 (×3): 1 via TOPICAL
  Filled 2020-01-01 (×2): qty 15

## 2020-01-01 MED ORDER — AMLODIPINE BESYLATE 10 MG PO TABS
10.0000 mg | ORAL_TABLET | Freq: Every day | ORAL | Status: DC
  Administered 2020-01-01 – 2020-01-04 (×4): 10 mg via ORAL
  Filled 2020-01-01: qty 2
  Filled 2020-01-01 (×2): qty 1
  Filled 2020-01-01: qty 2

## 2020-01-01 MED ORDER — POTASSIUM CHLORIDE CRYS ER 20 MEQ PO TBCR
20.0000 meq | EXTENDED_RELEASE_TABLET | Freq: Once | ORAL | Status: AC
  Administered 2020-01-01: 20 meq via ORAL
  Filled 2020-01-01: qty 1

## 2020-01-01 MED ORDER — DELFLEX-LC/1.5% DEXTROSE 344 MOSM/L IP SOLN
INTRAPERITONEAL | Status: DC
  Administered 2020-01-02: 8 L via INTRAPERITONEAL
  Filled 2020-01-01 (×4): qty 3000

## 2020-01-01 MED ORDER — TORSEMIDE 20 MG PO TABS
20.0000 mg | ORAL_TABLET | Freq: Every day | ORAL | Status: DC
  Administered 2020-01-01 – 2020-01-04 (×4): 20 mg via ORAL
  Filled 2020-01-01 (×4): qty 1

## 2020-01-01 MED ORDER — CINACALCET HCL 30 MG PO TABS
30.0000 mg | ORAL_TABLET | Freq: Every day | ORAL | Status: DC
  Administered 2020-01-02 – 2020-01-04 (×3): 30 mg via ORAL
  Filled 2020-01-01 (×3): qty 1

## 2020-01-01 MED ORDER — LEVETIRACETAM 500 MG PO TABS
1000.0000 mg | ORAL_TABLET | Freq: Every day | ORAL | Status: DC
  Administered 2020-01-01 – 2020-01-03 (×3): 1000 mg via ORAL
  Filled 2020-01-01 (×3): qty 2

## 2020-01-01 MED ORDER — DILTIAZEM HCL ER COATED BEADS 120 MG PO CP24
120.0000 mg | ORAL_CAPSULE | Freq: Every day | ORAL | Status: DC
  Administered 2020-01-01 – 2020-01-04 (×4): 120 mg via ORAL
  Filled 2020-01-01 (×4): qty 1

## 2020-01-01 MED ORDER — AZATHIOPRINE 50 MG PO TABS
50.0000 mg | ORAL_TABLET | Freq: Every day | ORAL | Status: DC
  Administered 2020-01-01 – 2020-01-03 (×3): 50 mg via ORAL
  Filled 2020-01-01 (×3): qty 1

## 2020-01-01 MED ORDER — ALBUTEROL SULFATE (2.5 MG/3ML) 0.083% IN NEBU: 3.0000 mL | INHALATION_SOLUTION | RESPIRATORY_TRACT | Status: DC | PRN

## 2020-01-01 MED ORDER — ONDANSETRON HCL 4 MG/2ML IJ SOLN
4.0000 mg | Freq: Four times a day (QID) | INTRAMUSCULAR | Status: DC | PRN
  Administered 2020-01-01 – 2020-01-02 (×2): 4 mg via INTRAVENOUS
  Filled 2020-01-01 (×2): qty 2

## 2020-01-01 MED ORDER — ONDANSETRON HCL 4 MG/2ML IJ SOLN
4.0000 mg | Freq: Once | INTRAMUSCULAR | Status: AC
  Administered 2020-01-01: 4 mg via INTRAVENOUS
  Filled 2020-01-01: qty 2

## 2020-01-01 MED ORDER — CYCLOSPORINE MODIFIED (NEORAL) 100 MG PO CAPS: 125.0000 mg | ORAL_CAPSULE | Freq: Every day | ORAL | Status: DC

## 2020-01-01 MED ORDER — VITAMIN D (ERGOCALCIFEROL) 1.25 MG (50000 UNIT) PO CAPS: 50000.0000 [IU] | ORAL_CAPSULE | ORAL | Status: DC

## 2020-01-01 MED ORDER — DULOXETINE HCL 30 MG PO CPEP
30.0000 mg | ORAL_CAPSULE | Freq: Every day | ORAL | Status: DC
  Administered 2020-01-01 – 2020-01-04 (×4): 30 mg via ORAL
  Filled 2020-01-01 (×4): qty 1

## 2020-01-01 NOTE — ED Notes (Signed)
This RN placed IV team consult to start second IV and collect lab specimen. This RN informed by IV team nurse that they do not start a second line to draw labs if a line is already present. This RN called lab to have them collect specimen.

## 2020-01-01 NOTE — ED Notes (Signed)
This RN attempting IV access and lab draw. This RN unsuccessful at this time. IV team consult placed for second IV access.

## 2020-01-01 NOTE — ED Notes (Signed)
This RN at bedside. Pt placed on 2L [place] due to oxygen saturation fluctuating when sleeping. Pt oxygen saturation stable at this time at 95-100%. MD made aware.

## 2020-01-01 NOTE — ED Notes (Signed)
Warm blanket given and cup of ice

## 2020-01-01 NOTE — ED Provider Notes (Signed)
[facility] Emergency Department Provider Note  ____________________________________________   First MD Initiated Contact with Patient 01/01/20 [phone]     (approximate)  I have reviewed the triage vital signs and the nursing notes.   HISTORY  Chief Complaint Chest Pain    HPI John Crystal. is a 34 y.o. male with below list of previous medical conditions including chronic abdominal pain chronic pancreatitis recent hospital admission here at [facility] secondary to intractable pain that the patient left AMA yesterday returns to the emergency department via EMS secondary to central chest and abdominal discomfort which patient states is currently 10 out of 10 with onset of 3:00 this morning.  Patient also admits to nausea         Past Medical History:  Diagnosis Date  . Atrial fibrillation ([place])   . Hypertension   . Seizures [facility])     Patient Active Problem List   Diagnosis Date Noted  . Chronic pain 12/30/2019  . Gastritis and duodenitis 12/30/2019  . History of pancreatitis 12/30/2019  . Left flank pain 12/30/2019  . Paroxysmal atrial fibrillation ([place]) 12/30/2019  . Hypokalemia 12/30/2019  . Fall 12/26/2019  . Hypotension 12/26/2019  . Hyponatremia 03/26/2019  . Weakness 03/14/2019  . Volume overload 02/09/2019  . Meibomian gland dysfunction (MGD) of both eyes 11/04/2018  . Allergic conjunctivitis of both eyes 08/03/2018  . Myopia of both eyes 08/03/2018  . 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 Generalized edema 06/07/2018  . Edema 01/20/2018  . Pneumonia 01/05/2018  . Cluster B personality disorder ([place]) 12/14/2017  . 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 Clostridium difficile diarrhea 10/26/2017  . Acute kidney injury ([place]) 07/03/2017  . Elevated lipase 04/11/2017  . Decreased urine output 05/13/2016  . Hyperkalemia 03/07/2016  . Malaise and fatigue 03/07/2016  . Chronic tension-type headache, intractable 11/29/2015  . Partial  symptomatic epilepsy with complex partial seizures, intractable, without status epilepticus ([place]) 11/28/2015  . Seizure disorder ([place]) 07/30/2015  . Erectile dysfunction 07/19/2014  . History of epididymitis 07/19/2014  . Hyperparathyroidism ([place]) 07/19/2014  . Low bone density for age 43/01/2014  . Chronic diarrhea 07/19/2014  . Pancreatitis, recurrent 07/19/2014  . Vitamin D deficiency 07/19/2014  . Chronic abdominal pain 05/22/2014  . Memory deficit 04/23/2014  . Nausea & vomiting 02/28/2014  . Scrotal pain 02/08/2014  . Diarrhea 12/04/2013  . Left arm pain 07/15/2013  . Epilepsy ([place]) 06/09/2013  . Anemia in chronic renal disease 05/31/2013  . Aftercare following organ transplant 05/11/2012  . Chronic groin pain 03/10/2012  . Neuropathic pain 03/10/2012  . Renal failure 03/10/2012  . History of kidney transplant 02/23/2012  . Essential hypertension 09/02/2011  . Sleep disturbance 05/11/2011  . 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 Bilateral hydrocele 01/12/2009  . Kidney transplant failure 01/08/2009  . Depressive disorder 04/29/2008  . Atrial fibrillation ([place]) 11/30/2007  . Abdominal pain 02/07/2005  . Immunosuppression ([place]) 10/02/1998  . ESRD (end stage renal disease) ([place]) 03/14/1998    Past Surgical History:  Procedure Laterality Date  . KIDNEY TRANSPLANT      Prior to Admission medications   Medication Sig Start Date End Date Taking? Authorizing Provider  acetaminophen (TYLENOL) 500 MG tablet Take 500-1,000 mg by mouth every 6 (six) hours as needed for mild pain or headache.     [provider]  amLODipine (NORVASC) 10 MG tablet Take 5-10 mg by mouth daily.     [provider]  apixaban (ELIQUIS) 2.5 MG TABS tablet  Take 2.5 mg by mouth 2 (two) times daily.    [provider]  azaTHIOprine (IMURAN) 50 MG tablet Take 50 mg by mouth daily.     [provider]  carvedilol (COREG) 25 MG tablet Take 37.5 mg by mouth 2 (two) times  daily with a meal.    [provider]  Cholecalciferol (VITAMIN D3) 1.25 MG (50000 UT) CAPS Take 50,000 Units by mouth every 30 (thirty) days.     [provider]  cinacalcet (SENSIPAR) 30 MG tablet Take 30 mg by mouth daily.    [provider]  CREON 24000-76000 units CPEP Take 1-3 capsules by mouth 3 (three) times daily as needed (with meals).     [provider]  cycloSPORINE modified (NEORAL) 100 MG capsule Take 100 mg by mouth 2 (two) times daily.     [provider]  diltiazem (CARDIZEM CD) 120 MG 24 hr capsule Take 120 mg by mouth daily.    [provider]  DULoxetine (CYMBALTA) 30 MG capsule Take 30 mg by mouth daily.    [provider]  gabapentin (NEURONTIN) 300 MG capsule Take 300 mg by mouth 2 (two) times daily. 05/03/18   [provider]  hyoscyamine (LEVSIN) 0.125 MG tablet Take 0.125 mg by mouth every 4 (four) hours as needed for bladder spasms or cramping.    [provider]  lacosamide 100 MG TABS Take 1 tablet (100 mg total) by mouth 2 (two) times daily. 03/31/19   Al Decant, MD  lamoTRIgine (LAMICTAL) 100 MG tablet Take 100 mg by mouth daily.    [provider]  levETIRAcetam (KEPPRA) 1000 MG tablet Take 1,000 mg by mouth at bedtime.    [provider]  mirtazapine (REMERON) 15 MG tablet Take 7.5 mg by mouth at bedtime. 05/26/18   [provider]  ondansetron (ZOFRAN) 4 MG tablet Take 4 mg by mouth every 8 (eight) hours as needed for nausea or vomiting.     [provider]  predniSONE (DELTASONE) 10 MG tablet Take 10 mg by mouth daily. 05/19/18   [provider]  PROAIR HFA 108 (90 Base) MCG/ACT inhaler Inhale 2 puffs into the lungs every 4 (four) hours as needed for shortness of breath or wheezing. 12/25/19   [provider]  sevelamer carbonate (RENVELA) 800 MG tablet Take 2,400 mg by mouth 3 (three) times daily with meals.     [provider]  tamsulosin (FLOMAX) 0.4 MG CAPS capsule Take 0.4 mg by mouth daily.    [provider]  torsemide (DEMADEX) 20 MG tablet Take 20 mg by mouth daily.    [provider]  vitamin B-12 (CYANOCOBALAMIN) 1000 MCG tablet Take 1,000 mcg by mouth daily.    [provider]    Allergies Dapsone, Morphine, Lipase, and Morphine and related  No family history on file.  Social History Social History   Tobacco Use  . Smoking status: Never Smoker  . Smokeless tobacco: Never Used  Substance Use Topics  . Alcohol use: Not Currently  . Drug use: Not on file    Review of Systems Constitutional: No fever/chills Eyes: No visual changes. ENT: No sore throat. Cardiovascular: Denies chest pain. Respiratory: Denies shortness of breath. Gastrointestinal: Positive for abdominal pain and nausea no diarrhea.  No constipation. Genitourinary: Negative for dysuria. Musculoskeletal: Negative for neck pain.  Negative for back pain. Integumentary: Negative for rash. Neurological: Negative for headaches, focal weakness or numbness.   ____________________________________________  PHYSICAL EXAM:  VITAL SIGNS: ED Triage Vitals  Enc Vitals Group     BP 01/01/20 0700 (!) 131/104     Pulse Rate 01/01/20 0700 (!) 102     Resp 01/01/20 0700 17     Temp --      Temp src --      SpO2 01/01/20 0700 100 %     Weight 01/01/20 0658 54.4 kg (119 lb 14.9 oz)     Height 01/01/20 0658 1.829 m (6')     Head Circumference --      Peak Flow --      Pain Score 01/01/20 0658 10     Pain Loc --      Pain Edu? --      Excl. in [place]? --     Constitutional: Alert and oriented.  Eyes: Conjunctivae are normal.  Mouth/Throat: Patient is wearing a mask. Neck: No stridor.  No meningeal signs.   Cardiovascular: Normal rate, regular rhythm. Good peripheral circulation. Grossly normal heart sounds. Respiratory: Normal respiratory effort.  No retractions. Gastrointestinal: Epigastric  tenderness to palpation. No distention.  Musculoskeletal: No lower extremity tenderness nor edema. No gross deformities of extremities. Neurologic:  Normal speech and language. No gross focal neurologic deficits are appreciated.  Skin:  Skin is warm, dry and intact. Psychiatric: Mood and affect are normal. Speech and behavior are normal.  ____________________________________________   LABS (all labs ordered are listed, but only abnormal results are displayed)  Labs Reviewed  CBC - Abnormal; Notable for the following components:      Result Value   RBC 3.26 (*)    Hemoglobin 11.0 (*)    HCT 33.8 (*)    MCV 103.7 (*)    All other components within normal limits  COMPREHENSIVE METABOLIC PANEL  LIPASE, BLOOD  TROPONIN I (HIGH SENSITIVITY)   ____________________________________________  EKG  ED ECG REPORT I, [place] Grant N Brayon Bielefeld, the attending physician, personally viewed and interpreted this ECG.   Date: 01/01/2020  EKG Time: 6:49 AM  Rate: 93  Rhythm: Normal sinus rhythm  Axis: Normal  Intervals: Normal  ST&T Change: None  _____________  Procedures   ____________________________________________   INITIAL IMPRESSION / MDM / ASSESSMENT AND PLAN / ED COURSE  As part of my medical decision making, I reviewed the following data within the electronic MEDICAL RECORD NUMBER2 year old male presented with above-stated history and physical exam after leaving AMA on the 17th.  Laboratory data pending.  Patient given 1 mg IV Dilaudid and 4 mg of Zofran.  I suspect that this may be secondary to known history of chronic pain.  Patient care transferred to Dr. Jimmye Norman  ____________________________________________  FINAL CLINICAL IMPRESSION(S) / ED DIAGNOSES  Final diagnoses:  Chronic abdominal pain     MEDICATIONS GIVEN DURING THIS VISIT:  Medications  ondansetron (ZOFRAN) injection 4 mg (4 mg Intravenous Given 01/01/20 0727)  HYDROmorphone (DILAUDID) injection 1 mg (1 mg  Intravenous Given 01/01/20 8891)     ED Discharge Orders    None      *Please note:  John Mikita. was evaluated in Emergency Department on 01/01/2020 for the symptoms described in the history of present illness. He was evaluated in the context of the global COVID-19 pandemic, which necessitated consideration that the patient might be at risk for infection with the SARS-CoV-2 virus that causes COVID-19. Institutional protocols and algorithms that pertain to the evaluation of patients at risk for COVID-19 are in a state of rapid change based  on information released by regulatory bodies including the CDC and federal and state organizations. These policies and algorithms were followed during the patient's care in the ED.  Some ED evaluations and interventions may be delayed as a result of limited staffing during the pandemic.*  Note:  This document was prepared using Dragon voice recognition software and may include unintentional dictation errors.   Gregor Hams, MD 01/01/20 [phone]

## 2020-01-01 NOTE — H&P (Signed)
History and Physical    John Cline. OQH:[id number] DOB: October 03, 1985 DOA: 01/01/2020  PCP: Servando Snare, MD   Patient coming from: Home  I have personally briefly reviewed patient's old medical records in [place]  Chief Complaint: Abdominal pain  HPI: John Cline. is a 34 y.o. male with medical history significant for history of failed kidney transplant, end-stage renal disease on peritoneal dialysis, chronic pancreatitis,  atrial fibrillation on Eliquis, history of CVA, history of seizures and history of substance use and concern for opioid use disorder. Patient presents to the emergency room for evaluation of abdominal pain which started per patient 4 days prior to this admission.  He was seen in the emergency room 2 days ago and was admitted for pain control but signed out [place].  Pain is mostly in the epigastric and non radiating. He describes the pain as diffuse and rates it a 12 x 10 in intensity at its worst.  It is sharp and associated with nausea and he denies having any vomiting, he denies having any diarrhea.  Patient admits to marijuana use prior to this episode.  Patient has not been able to tolerate any oral intake and trial of p.o. was attempted in the ER which patient failed. Patient was recently admitted at [facility] from April 5-8 for acute on chronic abdominal pain, nausea and vomiting.  He had PD fluid cultures done during that hospitalization which were negative. Attempts were made by the ER physician to transfer him to [facility] but they have no beds available. He will be admitted to the hospital for further evaluation  ED Course: Patient with a known history of chronic pancreatitis who presents for evaluation of worsening abdominal pain consistent with his known history of pancreatitis associated with nausea and inability to tolerate oral intake.  Attempts were made to transfer patient to [facility] where he receives his care but they have no beds  at this time  Review of Systems: As per HPI otherwise 10 point review of systems negative.    Past Medical History:  Diagnosis Date  . Atrial fibrillation ([place])   . Hypertension   . Seizures ([place])     Past Surgical History:  Procedure Laterality Date  . KIDNEY TRANSPLANT       reports that he has never smoked. He has never used smokeless tobacco. He reports previous alcohol use. No history on file for drug.  Allergies  Allergen Reactions  . Dapsone Other (See Comments)    Methemoglobinemia    Other reaction(s): Other (See Comments) Childhood allergies methemoglobinemia Other reaction(s): Unknown   . Morphine Hives and Rash    Per patient, this is just redness and itching at injection site. Confusion Other reaction(s): Unknown   . Lipase Other (See Comments)    Constipation  Other reaction(s): Other (See Comments) Constipation Other reaction(s): Unknown  . Morphine And Related Rash    No family history on file.   Prior to Admission medications   Medication Sig Start Date End Date Taking? Authorizing Provider  acetaminophen (TYLENOL) 500 MG tablet Take 500-1,000 mg by mouth every 6 (six) hours as needed for mild pain or headache.    Yes [provider]  amLODipine (NORVASC) 10 MG tablet Take 10 mg by mouth daily.    Yes [provider]  apixaban (ELIQUIS) 2.5 MG TABS tablet Take 2.5 mg by mouth 2 (two) times daily.   Yes [provider]  azaTHIOprine (IMURAN) 50 MG  tablet Take 50 mg by mouth daily.    Yes [provider]  carvedilol (COREG) 25 MG tablet Take 37.5 mg by mouth 2 (two) times daily with a meal.   Yes [provider]  Cholecalciferol (VITAMIN D3) 1.25 MG (50000 UT) CAPS Take 50,000 Units by mouth every 30 (thirty) days.    Yes [provider]  cinacalcet (SENSIPAR) 30 MG tablet Take 30 mg by mouth daily.   Yes [provider]  CREON 24000-76000 units CPEP Take 1 capsule by mouth 3 (three)  times daily as needed (with meals).    Yes [provider]  cycloSPORINE modified (NEORAL) 100 MG capsule Take 125 mg by mouth daily.    Yes [provider]  diltiazem (CARDIZEM CD) 120 MG 24 hr capsule Take 120 mg by mouth daily.   Yes [provider]  DULoxetine (CYMBALTA) 30 MG capsule Take 30 mg by mouth daily.   Yes [provider]  gabapentin (NEURONTIN) 300 MG capsule Take 300 mg by mouth 2 (two) times daily. 05/03/18  Yes [provider]  hyoscyamine (LEVSIN) 0.125 MG tablet Take 0.125 mg by mouth every 4 (four) hours as needed for bladder spasms or cramping.   Yes [provider]  lacosamide 100 MG TABS Take 1 tablet (100 mg total) by mouth 2 (two) times daily. 03/31/19  Yes Al Decant, MD  lamoTRIgine (LAMICTAL) 100 MG tablet Take 100 mg by mouth daily.   Yes [provider]  levETIRAcetam (KEPPRA) 1000 MG tablet Take 1,000 mg by mouth at bedtime.   Yes [provider]  mirtazapine (REMERON) 15 MG tablet Take 7.5 mg by mouth at bedtime. 05/26/18  Yes [provider]  ondansetron (ZOFRAN) 4 MG tablet Take 4 mg by mouth every 8 (eight) hours as needed for nausea or vomiting.    Yes [provider]  predniSONE (DELTASONE) 10 MG tablet Take 10 mg by mouth daily. 05/19/18  Yes [provider]  PROAIR HFA 108 (90 Base) MCG/ACT inhaler Inhale 2 puffs into the lungs every 4 (four) hours as needed for shortness of breath or wheezing. 12/25/19  Yes [provider]  sevelamer carbonate (RENVELA) 800 MG tablet Take 2,400 mg by mouth 3 (three) times daily with meals.    Yes [provider]  tamsulosin (FLOMAX) 0.4 MG CAPS capsule Take 0.4 mg by mouth daily.   Yes [provider]  torsemide (DEMADEX) 20 MG tablet Take 20 mg by mouth daily.   Yes [provider]  vitamin B-12 (CYANOCOBALAMIN) 1000 MCG tablet Take 1,000 mcg by mouth daily.   Yes [provider]     Physical Exam: Vitals:   01/01/20 1230 01/01/20 1245 01/01/20 1300 01/01/20 1330  BP: 100/76  107/82 93/75  Pulse: 68 (!) 46 75 77  Resp:  (!) 8 (!) 7 (!) 6  Temp:      TempSrc:      SpO2: 100% 100% 100% 100%  Weight:      Height:         Vitals:   01/01/20 1230 01/01/20 1245 01/01/20 1300 01/01/20 1330  BP: 100/76  107/82 93/75  Pulse: 68 (!) 46 75 77  Resp:  (!) 8 (!) 7 (!) 6  Temp:      TempSrc:      SpO2: 100% 100% 100% 100%  Weight:      Height:        Constitutional: NAD, lethargic but arouses to loud verbal  stimuli Eyes: PERRL, lids and conjunctivae normal, pale conjunctiva ENMT: Mucous membranes are moist.  Neck: normal, supple, no masses, no thyromegaly Respiratory: clear to auscultation bilaterally, no wheezing, no crackles. Normal respiratory effort. No accessory muscle use.  Cardiovascular: Regular rate and rhythm, no murmurs / rubs / gallops. No extremity edema. 2+ pedal pulses. No carotid bruits.  Abdomen: Epigastric tenderness, no masses palpated. No hepatosplenomegaly. Bowel sounds positive.  Musculoskeletal: no clubbing / cyanosis. No joint deformity upper and lower extremities.  Skin: no rashes, lesions, ulcers.  Neurologic: No gross focal neurologic deficit. Psychiatric: Normal mood and affect.   Labs on Admission: I have personally reviewed following labs and imaging studies  CBC: Recent Labs  Lab 12/29/19 2029 01/01/20 0656  WBC 5.8 8.3  HGB 12.4* 11.0*  HCT 33.7* 33.8*  MCV 91.6 103.7*  PLT 381 622   Basic Metabolic Panel: Recent Labs  Lab 12/29/19 2029 12/30/19 0552 01/01/20 0656  NA 131*  --  131*  K 2.9*  --  3.1*  CL 83*  --  80*  CO2 27  --  27  GLUCOSE 101*  --  93  BUN 58*  --  63*  CREATININE 7.36*  --  7.55*  CALCIUM 9.1  --  9.0  MG  --  2.1  --    GFR: Estimated Creatinine Clearance: 10.4 mL/min (A) (by C-G formula based on SCr of 7.55 mg/dL (H)). Liver Function Tests: Recent Labs  Lab 12/29/19 2029  01/01/20 0656  AST 29 34  ALT 13 15  ALKPHOS 86 76  BILITOT 2.0* 0.8  PROT 7.3 6.3*  ALBUMIN 4.0 3.5   Recent Labs  Lab 12/29/19 2029 01/01/20 0656  LIPASE 36 59*   No results for input(s): AMMONIA in the last 168 hours. Coagulation Profile: No results for input(s): INR, PROTIME in the last 168 hours. Cardiac Enzymes: No results for input(s): CKTOTAL, CKMB, CKMBINDEX, TROPONINI in the last 168 hours. BNP (last 3 results) No results for input(s): PROBNP in the last 8760 hours. HbA1C: No results for input(s): HGBA1C in the last 72 hours. CBG: No results for input(s): GLUCAP in the last 168 hours. Lipid Profile: No results for input(s): CHOL, HDL, LDLCALC, TRIG, CHOLHDL, LDLDIRECT in the last 72 hours. Thyroid Function Tests: No results for input(s): TSH, T4TOTAL, FREET4, T3FREE, THYROIDAB in the last 72 hours. Anemia Panel: No results for input(s): VITAMINB12, FOLATE, FERRITIN, TIBC, IRON, RETICCTPCT in the last 72 hours. Urine analysis:    Component Value Date/Time   COLORURINE YELLOW (A) 12/30/2019 0552   APPEARANCEUR CLEAR (A) 12/30/2019 0552   APPEARANCEUR Clear 05/25/2014 1509   LABSPEC 1.016 12/30/2019 0552   LABSPEC 1.005 05/25/2014 1509   PHURINE 6.0 12/30/2019 0552   GLUCOSEU NEGATIVE 12/30/2019 0552   GLUCOSEU Negative 05/25/2014 1509   HGBUR NEGATIVE 12/30/2019 0552   BILIRUBINUR NEGATIVE 12/30/2019 0552   BILIRUBINUR Negative 05/25/2014 1509   KETONESUR NEGATIVE 12/30/2019 0552   PROTEINUR 100 (A) 12/30/2019 0552   NITRITE NEGATIVE 12/30/2019 0552   LEUKOCYTESUR NEGATIVE 12/30/2019 0552   LEUKOCYTESUR Negative 05/25/2014 1509    Radiological Exams on Admission: DG Chest Portable 1 View  Result Date: 01/01/2020 CLINICAL DATA:  34 year old male with chest pain since 0300 hours. EXAM: PORTABLE CHEST 1 VIEW COMPARISON:  Chest radiographs 12/29/2019 and earlier. FINDINGS: Portable AP semi upright view at 0715 hours. Lower lung volumes today. Mediastinal  contours remain normal. Visualized tracheal air column is within normal limits. Lung markings are stable with no  pneumothorax, pulmonary edema, pleural effusion or confluent pulmonary opacity. No acute osseous abnormality identified. Paucity of bowel gas in the upper abdomen. IMPRESSION: Lower lung volumes.  No acute cardiopulmonary abnormality. Electronically Signed   By: Genevie Ann M.D.   On: 01/01/2020 07:56    EKG: Independently reviewed.  Atrial Fibrillation  Assessment/Plan Principal Problem:   Acute on chronic pancreatitis [facility]) Active Problems:   Depressive disorder   Seizure disorder ([place])   ESRD (end stage renal disease) ([place])   History of kidney transplant   Essential hypertension   Atrial fibrillation ([facility])   Hypokalemia    Abdominal pain Most likely secondary to acute on chronic pancreatitis Patient with a history of chronic pancreatitis who presents to the emergency room for evaluation of worsening abdominal pain associated with emesis Patient admits to cannabinoid use ??  Cannabinoid hyperemesis syndrome We will keep patient n.p.o. except for meds if he is able to tolerate Pain control   End-stage renal disease on peritoneal dialysis We will request nephrology consult   Hypokalemia secondary to GI loss Supplement potassium Obtain magnesium levels   History of atrial fibrillation/atrial flutter  Continue Cardizem and Coreg for rate control Continue Eliquis   Anemia of chronic disease H/H is stable   Hypertension Patient is normotensive Hold Clonidine Holding parameters for other antihypertensive medications   S/p Failed renal transplant Continue immunosuppressive therapy Continue cyclosporine, azathioprine and prednisone   History of seizures Continue Keppra  Seizure precautions       DVT prophylaxis: Eliquis Code Status: Full Code Family Communication: Plan of care was discussed with patient at the bedside. He verbalizes  understanding and agrees with the plan Disposition Plan: Back to previous home environment Consults called:  Nephrology    Edan Juday MD [facility]     01/01/2020, 2:10 PM

## 2020-01-01 NOTE — ED Provider Notes (Signed)
[facility] does not have any beds to offer for transfer.  I will discuss with the hospitalist here for admission.   Earleen Newport, MD 01/01/20 1149

## 2020-01-01 NOTE — ED Notes (Addendum)
Pt requesting to take medicines order at 9pm. Pt stating that is when he usually takes them all and wants to remain on his scheduled regimen.

## 2020-01-01 NOTE — ED Notes (Signed)
This RN at bedside to answer call bell. Pt his pain is 10/10 and has had no relief. Pt stating he is also nauseas. MD made aware.

## 2020-01-01 NOTE — ED Triage Notes (Signed)
Pt arrives via [facility] with c/o chest pain since 0300 or 0400 when he awoke with sharp chest pain. Pt was seen here on the 16th for the same and was at [facility] yesterday where he left AMA from. Pt is a peritoneal dialysis pt which he performs at home daily.

## 2020-01-01 NOTE — ED Notes (Signed)
This RN at bedside to answer call bell. Pt his pain is 10/10 and needs pain medicine now. Pt stating he is also nauseas. MD made aware.

## 2020-01-01 NOTE — ED Notes (Signed)
[facility]  CALLED  PER  DR  WILLIAMS  MD 

## 2020-01-01 NOTE — ED Notes (Signed)
This RN at bedside to answer call bell. Pt his pain is 10/10 and needs pain medicine now. MD made aware.

## 2020-01-01 NOTE — ED Notes (Signed)
XRAY  POWERSHARE  WITH  [facility] 

## 2020-01-01 NOTE — ED Notes (Signed)
Answered call bell and patient wants pain meds notified Claiborne Billings

## 2020-01-02 DIAGNOSIS — I4892 Unspecified atrial flutter: Secondary | ICD-10-CM | POA: Diagnosis present

## 2020-01-02 DIAGNOSIS — D84821 Immunodeficiency due to drugs: Secondary | ICD-10-CM | POA: Diagnosis present

## 2020-01-02 DIAGNOSIS — F1290 Cannabis use, unspecified, uncomplicated: Secondary | ICD-10-CM | POA: Diagnosis present

## 2020-01-02 DIAGNOSIS — K859 Acute pancreatitis without necrosis or infection, unspecified: Secondary | ICD-10-CM | POA: Diagnosis not present

## 2020-01-02 DIAGNOSIS — E876 Hypokalemia: Secondary | ICD-10-CM | POA: Diagnosis present

## 2020-01-02 DIAGNOSIS — N1330 Unspecified hydronephrosis: Secondary | ICD-10-CM | POA: Diagnosis present

## 2020-01-02 DIAGNOSIS — Y830 Surgical operation with transplant of whole organ as the cause of abnormal reaction of the patient, or of later complication, without mention of misadventure at the time of the procedure: Secondary | ICD-10-CM | POA: Diagnosis present

## 2020-01-02 DIAGNOSIS — Z885 Allergy status to narcotic agent status: Secondary | ICD-10-CM | POA: Diagnosis not present

## 2020-01-02 DIAGNOSIS — Z7952 Long term (current) use of systemic steroids: Secondary | ICD-10-CM | POA: Diagnosis not present

## 2020-01-02 DIAGNOSIS — K8681 Exocrine pancreatic insufficiency: Secondary | ICD-10-CM | POA: Diagnosis present

## 2020-01-02 DIAGNOSIS — G40909 Epilepsy, unspecified, not intractable, without status epilepticus: Secondary | ICD-10-CM

## 2020-01-02 DIAGNOSIS — Z20822 Contact with and (suspected) exposure to covid-19: Secondary | ICD-10-CM | POA: Diagnosis present

## 2020-01-02 DIAGNOSIS — R109 Unspecified abdominal pain: Secondary | ICD-10-CM | POA: Diagnosis present

## 2020-01-02 DIAGNOSIS — F329 Major depressive disorder, single episode, unspecified: Secondary | ICD-10-CM | POA: Diagnosis present

## 2020-01-02 DIAGNOSIS — I482 Chronic atrial fibrillation, unspecified: Secondary | ICD-10-CM | POA: Diagnosis not present

## 2020-01-02 DIAGNOSIS — Z888 Allergy status to other drugs, medicaments and biological substances status: Secondary | ICD-10-CM | POA: Diagnosis not present

## 2020-01-02 DIAGNOSIS — N2581 Secondary hyperparathyroidism of renal origin: Secondary | ICD-10-CM | POA: Diagnosis present

## 2020-01-02 DIAGNOSIS — N186 End stage renal disease: Secondary | ICD-10-CM | POA: Diagnosis present

## 2020-01-02 DIAGNOSIS — D631 Anemia in chronic kidney disease: Secondary | ICD-10-CM | POA: Diagnosis present

## 2020-01-02 DIAGNOSIS — Z7682 Awaiting organ transplant status: Secondary | ICD-10-CM | POA: Diagnosis not present

## 2020-01-02 DIAGNOSIS — Z8673 Personal history of transient ischemic attack (TIA), and cerebral infarction without residual deficits: Secondary | ICD-10-CM | POA: Diagnosis not present

## 2020-01-02 DIAGNOSIS — I120 Hypertensive chronic kidney disease with stage 5 chronic kidney disease or end stage renal disease: Secondary | ICD-10-CM | POA: Diagnosis present

## 2020-01-02 DIAGNOSIS — Z7901 Long term (current) use of anticoagulants: Secondary | ICD-10-CM | POA: Diagnosis not present

## 2020-01-02 DIAGNOSIS — K861 Other chronic pancreatitis: Secondary | ICD-10-CM | POA: Diagnosis present

## 2020-01-02 DIAGNOSIS — T8619 Other complication of kidney transplant: Secondary | ICD-10-CM | POA: Diagnosis present

## 2020-01-02 DIAGNOSIS — G8929 Other chronic pain: Secondary | ICD-10-CM | POA: Diagnosis present

## 2020-01-02 DIAGNOSIS — Z992 Dependence on renal dialysis: Secondary | ICD-10-CM | POA: Diagnosis not present

## 2020-01-02 DIAGNOSIS — I4891 Unspecified atrial fibrillation: Secondary | ICD-10-CM | POA: Diagnosis present

## 2020-01-02 LAB — BASIC METABOLIC PANEL
Anion gap: 15 (ref 5–15)
BUN: 66 mg/dL — ABNORMAL HIGH (ref 6–20)
CO2: 36 mmol/L — ABNORMAL HIGH (ref 22–32)
Calcium: 8.7 mg/dL — ABNORMAL LOW (ref 8.9–10.3)
Chloride: 79 mmol/L — ABNORMAL LOW (ref 98–111)
Creatinine, Ser: 7.79 mg/dL — ABNORMAL HIGH (ref 0.61–1.24)
GFR calc Af Amer: 9 mL/min — ABNORMAL LOW (ref >60)
GFR calc non Af Amer: 8 mL/min — ABNORMAL LOW (ref >60)
Glucose, Bld: 94 mg/dL (ref 70–99)
Potassium: 3.3 mmol/L — ABNORMAL LOW (ref 3.5–5.1)
Sodium: 130 mmol/L — ABNORMAL LOW (ref 135–145)

## 2020-01-02 LAB — CBC
HCT: 27.4 % — ABNORMAL LOW (ref 39.0–52.0)
Hemoglobin: 9.5 g/dL — ABNORMAL LOW (ref 13.0–17.0)
MCH: 33.8 pg (ref 26.0–34.0)
MCHC: 34.7 g/dL (ref 30.0–36.0)
MCV: 97.5 fL (ref 80.0–100.0)
Platelets: 241 10*3/uL (ref 150–400)
RBC: 2.81 MIL/uL — ABNORMAL LOW (ref 4.22–5.81)
RDW: 13 % (ref 11.5–15.5)
WBC: 7.1 10*3/uL (ref 4.0–10.5)
nRBC: 0 % (ref 0.0–0.2)

## 2020-01-02 LAB — GLUCOSE, CAPILLARY: Glucose-Capillary: 96 mg/dL (ref 70–99)

## 2020-01-02 MED ORDER — OXYCODONE HCL 5 MG PO TABS
5.0000 mg | ORAL_TABLET | Freq: Four times a day (QID) | ORAL | Status: DC | PRN
  Administered 2020-01-03 (×2): 10 mg via ORAL
  Filled 2020-01-02 (×2): qty 2

## 2020-01-02 NOTE — Consult Note (Signed)
[facility] CONSULT NOTE    Date: 01/02/2020                  Patient Name:  John Cline.  MRN: [id number]  DOB: 05-17-1986  Age / Sex: 34 y.o., male         PCP: Servando Snare, MD                 Service Requesting Consult: Hospitalist                 Reason for Consult:  Evaluation management of ESRD            History of Present Illness: Patient is a 34 y.o. 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  He had similar admission at [facility] recently for the same and found to have acute on chronic pancreatitis.  He was seen several days ago in the emergency department and left [place].  Still having abdominal pain now.  In regards to his underlying dialysis treatments he performs CAPD at home.  He is also followed closely by Dr. Kerby Moors of [facility] nephrology.  It appears that the patient remains on immunosuppression at this time.  He recently did have PD fluid cultures done which were negative in early April.   Medications: Outpatient medications: (Not in a hospital admission)   Current medications: Current Facility-Administered Medications  Medication Dose Route Frequency Provider Last Rate Last Admin  . acetaminophen (TYLENOL) tablet 500-1,000 mg  500-1,000 mg Oral Q6H PRN Agbata, Tochukwu, MD   1,000 mg at 01/01/20 2335  . albuterol (PROVENTIL) (2.5 MG/3ML) 0.083% nebulizer solution 3 mL  3 mL Inhalation Q4H PRN Agbata, Tochukwu, MD      . amLODipine (NORVASC) tablet 10 mg  10 mg Oral Daily Agbata, Tochukwu, MD   10 mg at 01/02/20 1011  . apixaban (ELIQUIS) tablet 2.5 mg  2.5 mg Oral BID Agbata, Tochukwu, MD   2.5 mg at 01/02/20 1017  . aspirin EC  tablet 81 mg  81 mg Oral Daily Agbata, Tochukwu, MD   81 mg at 01/01/20 2115  . azaTHIOprine (IMURAN) tablet 50 mg  50 mg Oral Daily Agbata, Tochukwu, MD   50 mg at 01/02/20 1015  . carvedilol (COREG) tablet 37.5 mg  37.5 mg Oral BID WC Agbata, Tochukwu, MD   37.5 mg at 01/02/20 0841  . cinacalcet (SENSIPAR) tablet 30 mg  30 mg Oral Daily Agbata, Tochukwu, MD   30 mg at 01/02/20 1012  . cycloSPORINE (SANDIMMUNE) capsule 100 mg  100 mg Oral q morning - 10a Hallaji, Sheema M, RPH   100 mg at 01/02/20 1014   And  . cycloSPORINE (SANDIMMUNE) capsule 75 mg  75 mg Oral QHS Hallaji, Sheema M, RPH      . dialysis solution 1.5% low-MG/low-CA dianeal solution   Intraperitoneal Q24H Everitt Wenner, MD      . diltiazem (CARDIZEM CD) 24 hr capsule 120 mg  120 mg Oral Daily Agbata, Tochukwu, MD   120 mg at 01/02/20 1018  . DULoxetine (CYMBALTA) DR capsule 30 mg  30 mg Oral Daily Agbata, Tochukwu, MD   30 mg at 01/02/20 1013  . gabapentin (NEURONTIN) capsule 300 mg  300 mg Oral  BID Agbata, Tochukwu, MD   300 mg at 01/02/20 1011  . gentamicin cream (GARAMYCIN) 0.1 % 1 application  1 application Topical Daily Nyree Applegate, MD   1 application at 85/88/50 1018  . heparin 1000 unit/ml injection 500 Units  500 Units Intraperitoneal PRN Shaun Runyon, MD      . HYDROmorphone (DILAUDID) injection 2 mg  2 mg Intravenous Q4H PRN Agbata, Tochukwu, MD   2 mg at 01/02/20 1016  . hyoscyamine (LEVSIN) tablet 0.125 mg  0.125 mg Oral Q4H PRN Agbata, Tochukwu, MD      . lacosamide (VIMPAT) tablet 100 mg  100 mg Oral BID Agbata, Tochukwu, MD   100 mg at 01/02/20 1012  . lamoTRIgine (LAMICTAL) tablet 100 mg  100 mg Oral Daily Agbata, Tochukwu, MD   100 mg at 01/02/20 1011  . levETIRAcetam (KEPPRA) tablet 1,000 mg  1,000 mg Oral QHS Agbata, Tochukwu, MD   1,000 mg at 01/01/20 2116  . lipase/protease/amylase (CREON) capsule 24,000 Units  24,000 Units Oral TID PRN Agbata, Tochukwu, MD      . mirtazapine (REMERON) tablet 7.5 mg   7.5 mg Oral QHS Agbata, Tochukwu, MD      . ondansetron (ZOFRAN) tablet 4 mg  4 mg Oral Q6H PRN Agbata, Tochukwu, MD       Or  . ondansetron (ZOFRAN) injection 4 mg  4 mg Intravenous Q6H PRN Agbata, Tochukwu, MD   4 mg at 01/02/20 0208  . predniSONE (DELTASONE) tablet 10 mg  10 mg Oral Daily Agbata, Tochukwu, MD   10 mg at 01/02/20 1012  . sevelamer carbonate (RENVELA) tablet 2,400 mg  2,400 mg Oral TID WC Agbata, Tochukwu, MD   2,400 mg at 01/02/20 0841  . tamsulosin (FLOMAX) capsule 0.4 mg  0.4 mg Oral Daily Agbata, Tochukwu, MD   0.4 mg at 01/02/20 1011  . torsemide (DEMADEX) tablet 20 mg  20 mg Oral Daily Agbata, Tochukwu, MD   20 mg at 01/02/20 1019  . vitamin B-12 (CYANOCOBALAMIN) tablet 1,000 mcg  1,000 mcg Oral Daily Agbata, Tochukwu, MD   1,000 mcg at 01/02/20 1018  . [START ON 01/13/2020] Vitamin D (Ergocalciferol) (DRISDOL) capsule 50,000 Units  50,000 Units Oral Q30 days Agbata, Tochukwu, MD       Current Outpatient Medications  Medication Sig Dispense Refill  . acetaminophen (TYLENOL) 500 MG tablet Take 500-1,000 mg by mouth every 6 (six) hours as needed for mild pain or headache.     Marland Kitchen amLODipine (NORVASC) 10 MG tablet Take 10 mg by mouth daily.   3  . apixaban (ELIQUIS) 2.5 MG TABS tablet Take 2.5 mg by mouth 2 (two) times daily.    Marland Kitchen azaTHIOprine (IMURAN) 50 MG tablet Take 75 mg by mouth daily.   11  . carvedilol (COREG) 25 MG tablet Take 25 mg by mouth 2 (two) times daily with a meal.     . Cholecalciferol (VITAMIN D3) 1.25 MG (50000 UT) CAPS Take 50,000 Units by mouth every 30 (thirty) days.     . cloNIDine (CATAPRES) 0.2 MG tablet Take 0.2 mg by mouth 3 (three) times daily.    . cycloSPORINE modified (NEORAL) 25 MG capsule Take 75-100 mg by mouth 2 (two) times daily. 100MG -AM 75MG -PM  11  . diltiazem (CARDIZEM CD) 120 MG 24 hr capsule Take 120 mg by mouth daily.    . DULoxetine (CYMBALTA) 30 MG capsule Take 30 mg by mouth daily.    Marland Kitchen gabapentin (NEURONTIN) 300 MG capsule Take  300 mg by mouth 2 (two) times daily.  11  . lacosamide 100 MG TABS Take 1 tablet (100 mg total) by mouth 2 (two) times daily. 60 tablet 0  . lamoTRIgine (LAMICTAL) 100 MG tablet Take 100 mg by mouth daily.    Marland Kitchen levETIRAcetam (KEPPRA) 1000 MG tablet Take 1,000 mg by mouth at bedtime.    . mirtazapine (REMERON) 15 MG tablet Take by mouth. Take 0.5 tablets (7.5mg ) daily at bedtime  3  . ondansetron (ZOFRAN) 4 MG tablet Take 4 mg by mouth every 8 (eight) hours as needed for nausea or vomiting.     . predniSONE (DELTASONE) 10 MG tablet Take 10 mg by mouth daily.  0  . PROAIR HFA 108 (90 Base) MCG/ACT inhaler Inhale 2 puffs into the lungs every 4 (four) hours as needed for shortness of breath or wheezing.    . sevelamer carbonate (RENVELA) 800 MG tablet Take 1,600 mg by mouth 3 (three) times daily with meals.     . tamsulosin (FLOMAX) 0.4 MG CAPS capsule Take 0.4 mg by mouth daily.  3  . torsemide (DEMADEX) 20 MG tablet Take 20 mg by mouth daily.    . vitamin B-12 (CYANOCOBALAMIN) 1000 MCG tablet Take 1,000 mcg by mouth daily.        Allergies: Allergies  Allergen Reactions  . Dapsone Other (See Comments)    Methemoglobinemia    Other reaction(s): Other (See Comments) Childhood allergies methemoglobinemia Other reaction(s): Unknown   . Morphine Hives and Rash    Per patient, this is just redness and itching at injection site. Confusion Other reaction(s): Unknown   . Lipase Other (See Comments)    Constipation  Other reaction(s): Other (See Comments) Constipation Other reaction(s): Unknown  . Morphine And Related Rash      Past Medical History: Past Medical History:  Diagnosis Date  . Atrial fibrillation ([place])   . Hypertension   . Seizures ([place])      Past Surgical History: Past Surgical History:  Procedure Laterality Date  . KIDNEY TRANSPLANT       Family History: No family history on file.   Social History: Social History   Socioeconomic History  . Marital  status: Single    Spouse name: Not on file  . Number of children: Not on file  . Years of education: Not on file  . Highest education level: Not on file  Occupational History  . Not on file  Tobacco Use  . Smoking status: Never Smoker  . Smokeless tobacco: Never Used  Substance and Sexual Activity  . Alcohol use: Not Currently  . Drug use: Not on file  . Sexual activity: Not on file  Other Topics Concern  . Not on file  Social History Narrative  . Not on file   Social Determinants of Health   Financial Resource Strain:   . Difficulty of Paying Living Expenses:   Food Insecurity:   . Worried About Charity fundraiser in the Last Year:   . Arboriculturist in the Last Year:   Transportation Needs:   . Film/video editor (Medical):   Marland Kitchen Lack of Transportation (Non-Medical):   Physical Activity:   . Days of Exercise per Week:   . Minutes of Exercise per Session:   Stress:   . Feeling of Stress :   Social Connections:   . Frequency of Communication with Friends and Family:   . Frequency of Social Gatherings with Friends and Family:   .  Attends Religious Services:   . Active Member of Clubs or Organizations:   . Attends Archivist Meetings:   Marland Kitchen Marital Status:   Intimate Partner Violence:   . Fear of Current or Ex-Partner:   . Emotionally Abused:   Marland Kitchen Physically Abused:   . Sexually Abused:      Review of Systems: Review of Systems  Constitutional: Positive for malaise/fatigue. Negative for chills and fever.  HENT: Negative for congestion, hearing loss and tinnitus.   Eyes: Negative for blurred vision and double vision.  Respiratory: Negative for cough, sputum production and shortness of breath.   Cardiovascular: Negative for chest pain, palpitations and orthopnea.  Gastrointestinal: Positive for abdominal pain, nausea and vomiting. Negative for diarrhea.  Genitourinary: Negative for dysuria, frequency and urgency.  Musculoskeletal: Negative for  myalgias.  Skin: Negative for itching and rash.  Neurological: Positive for weakness. Negative for dizziness and focal weakness.  Endo/Heme/Allergies: Negative for polydipsia. Does not bruise/bleed easily.  Psychiatric/Behavioral: The patient is not nervous/anxious.      Vital Signs: Blood pressure 117/86, pulse 91, temperature 98.3 F (36.8 C), temperature source Oral, resp. rate 20, height 6' (1.829 m), weight 54.4 kg, SpO2 98 %.  Weight trends: Filed Weights   01/01/20 0658  Weight: 54.4 kg    Physical Exam: General: NAD, resting in bed.  Head: Normocephalic, atraumatic.  Eyes: Anicteric, EOMI  Nose: Mucous membranes moist, not inflammed, nonerythematous.  Throat: Oropharynx nonerythematous, no exudate appreciated.   Neck: Supple, trachea midline.  Lungs:  Normal respiratory effort. Clear to auscultation BL without crackles or wheezes.  Heart: RRR. S1 and S2 normal without gallop, murmur, or rubs.  Abdomen:  Mild diffuse tenderness, BS present, PD catheter intact  Extremities: 1+ pretibial edema.  Neurologic: A&O X3, Motor strength is 5/5 in the all 4 extremities  Skin: No visible rashes, scars.    Lab results: Basic Metabolic Panel: Recent Labs  Lab 12/29/19 2029 12/30/19 0552 01/01/20 0656 01/02/20 0433  NA 131*  --  131* 130*  K 2.9*  --  3.1* 3.3*  CL 83*  --  80* 79*  CO2 27  --  27 36*  GLUCOSE 101*  --  93 94  BUN 58*  --  63* 66*  CREATININE 7.36*  --  7.55* 7.79*  CALCIUM 9.1  --  9.0 8.7*  MG  --  2.1  --   --     Liver Function Tests: Recent Labs  Lab 12/29/19 2029 01/01/20 0656  AST 29 34  ALT 13 15  ALKPHOS 86 76  BILITOT 2.0* 0.8  PROT 7.3 6.3*  ALBUMIN 4.0 3.5   Recent Labs  Lab 12/29/19 2029 01/01/20 0656  LIPASE 36 59*   No results for input(s): AMMONIA in the last 168 hours.  CBC: Recent Labs  Lab 12/29/19 2029 01/01/20 0656 01/02/20 0433  WBC 5.8 8.3 7.1  HGB 12.4* 11.0* 9.5*  HCT 33.7* 33.8* 27.4*  MCV 91.6 103.7*  97.5  PLT 381 262 241    Cardiac Enzymes: No results for input(s): CKTOTAL, CKMB, CKMBINDEX, TROPONINI in the last 168 hours.  BNP: Invalid input(s): POCBNP  CBG: Recent Labs  Lab 01/02/20 0746  GLUCAP 96    Microbiology: Results for orders placed or performed during the hospital encounter of 01/01/20  Respiratory Panel by RT PCR (Flu A&B, Covid) - Nasopharyngeal Swab     Status: None   Collection Time: 01/01/20 11:31 AM   Specimen: Nasopharyngeal Swab  Result  Value Ref Range Status   SARS Coronavirus 2 by RT PCR NEGATIVE NEGATIVE Final    Comment: (NOTE) SARS-CoV-2 target nucleic acids are NOT DETECTED. The SARS-CoV-2 RNA is generally detectable in upper respiratoy specimens during the acute phase of infection. The lowest concentration of SARS-CoV-2 viral copies this assay can detect is 131 copies/mL. A negative result does not preclude SARS-Cov-2 infection and should not be used as the sole basis for treatment or other patient management decisions. A negative result may occur with  improper specimen collection/handling, submission of specimen other than nasopharyngeal swab, presence of viral mutation(s) within the areas targeted by this assay, and inadequate number of viral copies (<131 copies/mL). A negative result must be combined with clinical observations, patient history, and epidemiological information. The expected result is Negative. Fact Sheet for Patients:  PinkCheek.be Fact Sheet for Healthcare Providers:  GravelBags.it This test is not yet ap proved or cleared by the [place] FDA and  has been authorized for detection and/or diagnosis of SARS-CoV-2 by FDA under an Emergency Use Authorization (EUA). This EUA will remain  in effect (meaning this test can be used) for the duration of the COVID-19 declaration under Section 564(b)(1) of the Act, 21 U.S.C. section 360bbb-3(b)(1), unless the  authorization is terminated or revoked sooner.    Influenza A by PCR NEGATIVE NEGATIVE Final   Influenza B by PCR NEGATIVE NEGATIVE Final    Comment: (NOTE) The Xpert Xpress SARS-CoV-2/FLU/RSV assay is intended as an aid in  the diagnosis of influenza from Nasopharyngeal swab specimens and  should not be used as a sole basis for treatment. Nasal washings and  aspirates are unacceptable for Xpert Xpress SARS-CoV-2/FLU/RSV  testing. Fact Sheet for Patients: PinkCheek.be Fact Sheet for Healthcare Providers: GravelBags.it This test is not yet approved or cleared by the [place] FDA and  has been authorized for detection and/or diagnosis of SARS-CoV-2 by  FDA under an Emergency Use Authorization (EUA). This EUA will remain  in effect (meaning this test can be used) for the duration of the  Covid-19 declaration under Section 564(b)(1) of the Act, 21  U.S.C. section 360bbb-3(b)(1), unless the authorization is  terminated or revoked. Performed at [facility], [address] [place], [place] [address]     Coagulation Studies: No results for input(s): LABPROT, INR in the last 72 hours.  Urinalysis: No results for input(s): COLORURINE, LABSPEC, PHURINE, GLUCOSEU, HGBUR, BILIRUBINUR, KETONESUR, PROTEINUR, UROBILINOGEN, NITRITE, LEUKOCYTESUR in the last 72 hours.  Invalid input(s): APPERANCEUR    Imaging: DG Chest Portable 1 View  Result Date: 01/01/2020 CLINICAL DATA:  34 year old male with chest pain since 0300 hours. EXAM: PORTABLE CHEST 1 VIEW COMPARISON:  Chest radiographs 12/29/2019 and earlier. FINDINGS: Portable AP semi upright view at 0715 hours. Lower lung volumes today. Mediastinal contours remain normal. Visualized tracheal air column is within normal limits. Lung markings are stable with no pneumothorax, pulmonary edema, pleural effusion or confluent pulmonary opacity. No acute osseous abnormality  identified. Paucity of bowel gas in the upper abdomen. IMPRESSION: Lower lung volumes.  No acute cardiopulmonary abnormality. Electronically Signed   By: Genevie Ann M.D.   On: 01/01/2020 07:56      Assessment & Plan: Pt is a 34 y.o. 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  [facility]/CAPD  1.  ESRD on PD.  We will resume peritoneal dialysis treatments tonight.  However patient will be placed on CCPD while he is admitted.  We will plan for 2 L fill volume, 4 exchanges, and use 1.5% dextrose solution for now.  In addition we will maintain the patient on current doses of cyclosporine, azathioprine, and prednisone.  2.  Anemia of chronic kidney disease.  Hemoglobin 9.5.  Hold off on administering Epogen for now.  3.  Secondary hyperparathyroidism.  Maintain the patient on current doses of Renvela and cinacalcet.  Continue to periodically monitor bone mineral metabolism parameters.  4.  Abdominal pain.  Appears to be some chronic component.  Had recent PD fluid culture.  However to ensure that he is not having peritonitis now we will obtain PD fluid culture and cell count with differential.

## 2020-01-02 NOTE — Progress Notes (Signed)
Refused bed alarm and seizure pads precaution.

## 2020-01-02 NOTE — Progress Notes (Signed)
Pd started 

## 2020-01-02 NOTE — ED Notes (Signed)
Ready bed @ 1329, patient going to room 230

## 2020-01-02 NOTE — Progress Notes (Addendum)
PROGRESS NOTE    John Cline.  YSA:[id number] DOB: 23-Nov-1985 DOA: 01/01/2020 PCP: Servando Snare, MD    Brief Narrative:  34 year old male with history of stage renal disease on peritoneal dialysis, IgA nephropathy, renal transplant in the past, admitted to the hospital with acute on chronic pancreatitis.  Admitted for supportive management with bowel rest, pain management.  Nephrology following for peritoneal dialysis.   Assessment & Plan:   Principal Problem:   Acute on chronic pancreatitis (HCC) Active Problems:   Depressive disorder   Seizure disorder ([place])   ESRD (end stage renal disease) ([place])   History of kidney transplant   Essential hypertension   Atrial fibrillation (HCC)   Hypokalemia   1. Acute on chronic pancreatitis.  Continue symptomatic management.  CT abdomen/pelvis unrevealing for acute process.  Currently on clear liquids.  Advance diet as tolerated.  Continue with pancreatic enzymes.  Request fluid analysis from peritoneal fluid. 2. End-stage renal disease on peritoneal dialysis.  Appreciate nephrology assistance.  Patient to undergo dialysis tonight. 3. History of renal transplant.  Continued on immunosuppressive therapy.  Would continue home regimen of azathioprine, cyclosporine, prednisone 4. Atrial fibrillation.  Rate is currently controlled.  Continue on carvedilol and diltiazem.  He is anticoagulated with apixaban. 5. Seizure disorder.  Stable.  Continue on home dose of Keppra and Vimpat. 6. Chronic hydronephrosis of transplanted kidney.  Has been present for several months.  Likely related to reflux.   DVT prophylaxis: Apixaban Code Status: Full code Family Communication: Discussed with patient Disposition Plan: Status is: Inpatient  Remains inpatient appropriate because:Ongoing active pain requiring inpatient pain management   Dispo: The patient is from: Home              Anticipated d/c is to: Home              Anticipated d/c date  is: 2 days              Patient currently is not medically stable to d/c.     Consultants:   Nephrology  Procedures:     Antimicrobials:       Subjective: Reports continued abdominal pain, nausea, unable to take anything by mouth.  Objective: Vitals:   01/02/20 1100 01/02/20 1332 01/02/20 1438 01/02/20 1747  BP: 117/86 (!) 118/93 112/71 114/79  Pulse: 91 80 73 74  Resp: 20 16 14 14   Temp:  98 F (36.7 C) 98.1 F (36.7 C) 98.2 F (36.8 C)  TempSrc:  Oral Oral Oral  SpO2: 98% 92% 95% 93%  Weight:      Height:        Intake/Output Summary (Last 24 hours) at 01/02/2020 1855 Last data filed at 01/02/2020 1104 Gross per 24 hour  Intake --  Output 300 ml  Net -300 ml   Filed Weights   01/01/20 0658  Weight: 54.4 kg    Examination:  General exam: Appears calm and comfortable  Respiratory system: Clear to auscultation. Respiratory effort normal. Cardiovascular system: S1 & S2 heard, RRR. No JVD, murmurs, rubs, gallops or clicks. No pedal edema. Gastrointestinal system: Abdomen is nondistended, soft and diffusely tender. No organomegaly or masses felt. Normal bowel sounds heard.  PD catheter in left lower quadrant without any surrounding erythema Central nervous system: Alert and oriented. No focal neurological deficits. Extremities: Symmetric 5 x 5 power. Skin: No rashes, lesions or ulcers Psychiatry: Judgement and insight appear normal. Mood & affect appropriate.     Data Reviewed: I  have personally reviewed following labs and imaging studies  CBC: Recent Labs  Lab 12/29/19 2029 01/01/20 0656 01/02/20 0433  WBC 5.8 8.3 7.1  HGB 12.4* 11.0* 9.5*  HCT 33.7* 33.8* 27.4*  MCV 91.6 103.7* 97.5  PLT 381 262 921   Basic Metabolic Panel: Recent Labs  Lab 12/29/19 2029 12/30/19 0552 01/01/20 0656 01/02/20 0433  NA 131*  --  131* 130*  K 2.9*  --  3.1* 3.3*  CL 83*  --  80* 79*  CO2 27  --  27 36*  GLUCOSE 101*  --  93 94  BUN 58*  --  63* 66*   CREATININE 7.36*  --  7.55* 7.79*  CALCIUM 9.1  --  9.0 8.7*  MG  --  2.1  --   --    GFR: Estimated Creatinine Clearance: 10.1 mL/min (A) (by C-G formula based on SCr of 7.79 mg/dL (H)). Liver Function Tests: Recent Labs  Lab 12/29/19 2029 01/01/20 0656  AST 29 34  ALT 13 15  ALKPHOS 86 76  BILITOT 2.0* 0.8  PROT 7.3 6.3*  ALBUMIN 4.0 3.5   Recent Labs  Lab 12/29/19 2029 01/01/20 0656  LIPASE 36 59*   No results for input(s): AMMONIA in the last 168 hours. Coagulation Profile: No results for input(s): INR, PROTIME in the last 168 hours. Cardiac Enzymes: No results for input(s): CKTOTAL, CKMB, CKMBINDEX, TROPONINI in the last 168 hours. BNP (last 3 results) No results for input(s): PROBNP in the last 8760 hours. HbA1C: No results for input(s): HGBA1C in the last 72 hours. CBG: Recent Labs  Lab 01/02/20 0746  GLUCAP 96   Lipid Profile: No results for input(s): CHOL, HDL, LDLCALC, TRIG, CHOLHDL, LDLDIRECT in the last 72 hours. Thyroid Function Tests: No results for input(s): TSH, T4TOTAL, FREET4, T3FREE, THYROIDAB in the last 72 hours. Anemia Panel: No results for input(s): VITAMINB12, FOLATE, FERRITIN, TIBC, IRON, RETICCTPCT in the last 72 hours. Sepsis Labs: No results for input(s): PROCALCITON, LATICACIDVEN in the last 168 hours.  Recent Results (from the past 240 hour(s))  Respiratory Panel by RT PCR (Flu A&B, Covid) - Nasopharyngeal Swab     Status: None   Collection Time: 01/01/20 11:31 AM   Specimen: Nasopharyngeal Swab  Result Value Ref Range Status   SARS Coronavirus 2 by RT PCR NEGATIVE NEGATIVE Final    Comment: (NOTE) SARS-CoV-2 target nucleic acids are NOT DETECTED. The SARS-CoV-2 RNA is generally detectable in upper respiratoy specimens during the acute phase of infection. The lowest concentration of SARS-CoV-2 viral copies this assay can detect is 131 copies/mL. A negative result does not preclude SARS-Cov-2 infection and should not be used  as the sole basis for treatment or other patient management decisions. A negative result may occur with  improper specimen collection/handling, submission of specimen other than nasopharyngeal swab, presence of viral mutation(s) within the areas targeted by this assay, and inadequate number of viral copies (<131 copies/mL). A negative result must be combined with clinical observations, patient history, and epidemiological information. The expected result is Negative. Fact Sheet for Patients:  PinkCheek.be Fact Sheet for Healthcare Providers:  GravelBags.it This test is not yet ap proved or cleared by the [place] FDA and  has been authorized for detection and/or diagnosis of SARS-CoV-2 by FDA under an Emergency Use Authorization (EUA). This EUA will remain  in effect (meaning this test can be used) for the duration of the COVID-19 declaration under Section 564(b)(1) of the Act, 21 U.S.C. section  360bbb-3(b)(1), unless the authorization is terminated or revoked sooner.    Influenza A by PCR NEGATIVE NEGATIVE Final   Influenza B by PCR NEGATIVE NEGATIVE Final    Comment: (NOTE) The Xpert Xpress SARS-CoV-2/FLU/RSV assay is intended as an aid in  the diagnosis of influenza from Nasopharyngeal swab specimens and  should not be used as a sole basis for treatment. Nasal washings and  aspirates are unacceptable for Xpert Xpress SARS-CoV-2/FLU/RSV  testing. Fact Sheet for Patients: PinkCheek.be Fact Sheet for Healthcare Providers: GravelBags.it This test is not yet approved or cleared by the [place] FDA and  has been authorized for detection and/or diagnosis of SARS-CoV-2 by  FDA under an Emergency Use Authorization (EUA). This EUA will remain  in effect (meaning this test can be used) for the duration of the  Covid-19 declaration under Section 564(b)(1) of the Act,  21  U.S.C. section 360bbb-3(b)(1), unless the authorization is  terminated or revoked. Performed at [facility], [address] [place], [place] [address]          Radiology Studies: DG Chest Portable 1 View  Result Date: 01/01/2020 CLINICAL DATA:  34 year old male with chest pain since 0300 hours. EXAM: PORTABLE CHEST 1 VIEW COMPARISON:  Chest radiographs 12/29/2019 and earlier. FINDINGS: Portable AP semi upright view at 0715 hours. Lower lung volumes today. Mediastinal contours remain normal. Visualized tracheal air column is within normal limits. Lung markings are stable with no pneumothorax, pulmonary edema, pleural effusion or confluent pulmonary opacity. No acute osseous abnormality identified. Paucity of bowel gas in the upper abdomen. IMPRESSION: Lower lung volumes.  No acute cardiopulmonary abnormality. Electronically Signed   By: Genevie Ann M.D.   On: 01/01/2020 07:56        Scheduled Meds: . amLODipine  10 mg Oral Daily  . apixaban  2.5 mg Oral BID  . aspirin EC  81 mg Oral Daily  . azaTHIOprine  50 mg Oral Daily  . carvedilol  37.5 mg Oral BID WC  . cinacalcet  30 mg Oral Daily  . cycloSPORINE  100 mg Oral q morning - 10a   And  . cycloSPORINE  75 mg Oral QHS  . diltiazem  120 mg Oral Daily  . DULoxetine  30 mg Oral Daily  . gabapentin  300 mg Oral BID  . gentamicin cream  1 application Topical Daily  . lacosamide  100 mg Oral BID  . lamoTRIgine  100 mg Oral Daily  . levETIRAcetam  1,000 mg Oral QHS  . mirtazapine  7.5 mg Oral QHS  . predniSONE  10 mg Oral Daily  . sevelamer carbonate  2,400 mg Oral TID WC  . tamsulosin  0.4 mg Oral Daily  . torsemide  20 mg Oral Daily  . vitamin B-12  1,000 mcg Oral Daily  . [START ON 01/13/2020] Vitamin D (Ergocalciferol)  50,000 Units Oral Q30 days   Continuous Infusions: . dialysis solution 1.5% low-MG/low-CA       LOS: 0 days    Time spent: 47mins    Kathie Dike, MD [facility]   If  7PM-7AM, please contact night-coverage [email or web address]  01/02/2020, 6:55 PM

## 2020-01-02 NOTE — ED Notes (Signed)
Report given to [facility] on floor. Patient verbalized understanding of admission. All belongings taken with patient.

## 2020-01-02 NOTE — Progress Notes (Signed)
Peritoneal dialysis patient known at [facility]. Per clinic, patient seems to forget things easily and often will ask you to repeat information. Clinic also informed that patient does exchanges at home and not the cycler. Please contact me with any dialysis placement concerns.

## 2020-01-03 LAB — BODY FLUID CELL COUNT WITH DIFFERENTIAL
Eos, Fluid: 0 %
Lymphs, Fluid: 5 %
Monocyte-Macrophage-Serous Fluid: 94 % — ABNORMAL HIGH (ref 50–90)
Neutrophil Count, Fluid: 1 % (ref 0–25)
Total Nucleated Cell Count, Fluid: 403 cu mm (ref 0–1000)

## 2020-01-03 LAB — RENAL FUNCTION PANEL
Albumin: 3.5 g/dL (ref 3.5–5.0)
Anion gap: 16 — ABNORMAL HIGH (ref 5–15)
BUN: 63 mg/dL — ABNORMAL HIGH (ref 6–20)
CO2: 32 mmol/L (ref 22–32)
Calcium: 8.5 mg/dL — ABNORMAL LOW (ref 8.9–10.3)
Chloride: 82 mmol/L — ABNORMAL LOW (ref 98–111)
Creatinine, Ser: 7.23 mg/dL — ABNORMAL HIGH (ref 0.61–1.24)
GFR calc Af Amer: 10 mL/min — ABNORMAL LOW (ref >60)
GFR calc non Af Amer: 9 mL/min — ABNORMAL LOW (ref >60)
Glucose, Bld: 78 mg/dL (ref 70–99)
Phosphorus: 6.2 mg/dL — ABNORMAL HIGH (ref 2.5–4.6)
Potassium: 3.2 mmol/L — ABNORMAL LOW (ref 3.5–5.1)
Sodium: 130 mmol/L — ABNORMAL LOW (ref 135–145)

## 2020-01-03 LAB — CBC
HCT: 27.9 % — ABNORMAL LOW (ref 39.0–52.0)
Hemoglobin: 10 g/dL — ABNORMAL LOW (ref 13.0–17.0)
MCH: 34 pg (ref 26.0–34.0)
MCHC: 35.8 g/dL (ref 30.0–36.0)
MCV: 94.9 fL (ref 80.0–100.0)
Platelets: 265 10*3/uL (ref 150–400)
RBC: 2.94 MIL/uL — ABNORMAL LOW (ref 4.22–5.81)
RDW: 13.1 % (ref 11.5–15.5)
WBC: 5.7 10*3/uL (ref 4.0–10.5)
nRBC: 0 % (ref 0.0–0.2)

## 2020-01-03 LAB — PATHOLOGIST SMEAR REVIEW

## 2020-01-03 MED ORDER — NEPRO/CARBSTEADY PO LIQD
237.0000 mL | Freq: Two times a day (BID) | ORAL | Status: DC
  Administered 2020-01-03: 237 mL via ORAL

## 2020-01-03 MED ORDER — HYDROMORPHONE HCL 1 MG/ML IJ SOLN
1.0000 mg | Freq: Four times a day (QID) | INTRAMUSCULAR | Status: DC | PRN
  Administered 2020-01-03 (×2): 1 mg via INTRAVENOUS
  Filled 2020-01-03 (×3): qty 1

## 2020-01-03 MED ORDER — HYDROMORPHONE HCL 1 MG/ML IJ SOLN: 2.0000 mg | Freq: Four times a day (QID) | INTRAMUSCULAR | Status: DC | PRN

## 2020-01-03 MED ORDER — POTASSIUM CHLORIDE CRYS ER 20 MEQ PO TBCR
20.0000 meq | EXTENDED_RELEASE_TABLET | Freq: Once | ORAL | Status: AC
  Administered 2020-01-03: 20 meq via ORAL
  Filled 2020-01-03: qty 1

## 2020-01-03 MED ORDER — AZATHIOPRINE 50 MG PO TABS
75.0000 mg | ORAL_TABLET | Freq: Every day | ORAL | Status: DC
  Administered 2020-01-04: 75 mg via ORAL
  Filled 2020-01-03: qty 2

## 2020-01-03 MED ORDER — RENA-VITE PO TABS
1.0000 | ORAL_TABLET | Freq: Every day | ORAL | Status: DC
  Administered 2020-01-04: 1 via ORAL
  Filled 2020-01-03: qty 1

## 2020-01-03 NOTE — TOC Initial Note (Signed)
Transition of Care (TOC) - Initial/Assessment Note    Patient Details  Name: John Cline. MRN: [id number] Date of Birth: Dec 20, 1985  Transition of Care [facility]) CM/SW Contact:    Shelbie Ammons, RN Phone Number: 01/03/2020, 2:45 PM  Clinical Narrative:    RNCM assessed patient. He reports that he has what he needs and does not feel like he needs anything except to get his pain under control. Patient reports that he transports himself to appointments and is able to get all of his medications. RNCM will follow for any needs.                Expected Discharge Plan: Home/Self Care Barriers to Discharge: Continued Medical Work up   Patient Goals and CMS Choice        Expected Discharge Plan and Services Expected Discharge Plan: Home/Self Care       Living arrangements for the past 2 months: Single Family Home                                      Prior Living Arrangements/Services Living arrangements for the past 2 months: Single Family Home Lives with:: Self Patient language and need for interpreter reviewed:: Yes Do you feel safe going back to the place where you live?: Yes      Need for Family Participation in Patient Care: Yes (Comment) Care giver support system in place?: Yes (comment)   Criminal Activity/Legal Involvement Pertinent to Current Situation/Hospitalization: No - Comment as needed  Activities of Daily Living Home Assistive Devices/Equipment: None ADL Screening (condition at time of admission) Patient's cognitive ability adequate to safely complete daily activities?: Yes Is the patient deaf or have difficulty hearing?: No Does the patient have difficulty seeing, even when wearing glasses/contacts?: No Does the patient have difficulty concentrating, remembering, or making decisions?: No Patient able to express need for assistance with ADLs?: Yes Does the patient have difficulty dressing or bathing?: No Independently performs ADLs?: Yes  (appropriate for developmental age) Does the patient have difficulty walking or climbing stairs?: No Weakness of Legs: None Weakness of Arms/Hands: None  Permission Sought/Granted                  Emotional Assessment Appearance:: Appears stated age     Orientation: : Oriented to Self, Oriented to Place, Oriented to  Time, Oriented to Situation Alcohol / Substance Use: Not Applicable Psych Involvement: No (comment)  Admission diagnosis:  Chronic abdominal pain [R10.9, G89.29] Peritoneal dialysis status ([place]) [Z99.2] Chronic pancreatitis, unspecified pancreatitis type ([place]) [K86.1] Acute on chronic pancreatitis ([place]) [K85.90, K86.1] Intractable vomiting with nausea, unspecified vomiting type [R11.2] Patient Active Problem List   Diagnosis Date Noted  . Acute on chronic pancreatitis ([place]) 01/01/2020  . Chronic pain 12/30/2019  . Gastritis and duodenitis 12/30/2019  . History of pancreatitis 12/30/2019  . Left flank pain 12/30/2019  . Paroxysmal atrial fibrillation ([place]) 12/30/2019  . Hypokalemia 12/30/2019  . Fall 12/26/2019  . Hypotension 12/26/2019  . Hyponatremia 03/26/2019  . Weakness 03/14/2019  . Volume overload 02/09/2019  . Meibomian gland dysfunction (MGD) of both eyes 11/04/2018  . Allergic conjunctivitis of both eyes 08/03/2018  . Myopia of both eyes 08/03/2018  . 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 Generalized edema 06/07/2018  . Edema 01/20/2018  . Pneumonia 01/05/2018  . Cluster B personality disorder ([place]) 12/14/2017  . 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 Clostridium difficile  diarrhea 10/26/2017  . Acute kidney injury ([place]) 07/03/2017  . Elevated lipase 04/11/2017  . Decreased urine output 05/13/2016  . Hyperkalemia 03/07/2016  . Malaise and fatigue 03/07/2016  . Chronic tension-type headache, intractable 11/29/2015  . Partial symptomatic epilepsy with complex partial seizures, intractable, without status epilepticus ([place]) 11/28/2015  . Seizure disorder  ([place]) 07/30/2015  . Erectile dysfunction 07/19/2014  . History of epididymitis 07/19/2014  . Hyperparathyroidism ([place]) 07/19/2014  . Low bone density for age 16/01/2014  . Chronic diarrhea 07/19/2014  . Pancreatitis, recurrent 07/19/2014  . Vitamin D deficiency 07/19/2014  . Chronic abdominal pain 05/22/2014  . Memory deficit 04/23/2014  . Nausea & vomiting 02/28/2014  . Scrotal pain 02/08/2014  . Diarrhea 12/04/2013  . Left arm pain 07/15/2013  . Epilepsy ([place]) 06/09/2013  . Anemia in chronic renal disease 05/31/2013  . Aftercare following organ transplant 05/11/2012  . Chronic groin pain 03/10/2012  . Neuropathic pain 03/10/2012  . Renal failure 03/10/2012  . History of kidney transplant 02/23/2012  . Essential hypertension 09/02/2011  . Sleep disturbance 05/11/2011  . 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 Bilateral hydrocele 01/12/2009  . Kidney transplant failure 01/08/2009  . Depressive disorder 04/29/2008  . Atrial fibrillation ([place]) 11/30/2007  . Abdominal pain 02/07/2005  . Immunosuppression ([place]) 10/02/1998  . ESRD (end stage renal disease) ([place]) 03/14/1998   PCP:  Servando Snare, MD Pharmacy:   [id number] [place], [place] [address] Phone: [phone] Fax: [phone]     Social Determinants of Health (SDOH) Interventions    Readmission Risk Interventions No flowsheet data found.

## 2020-01-03 NOTE — Progress Notes (Signed)
Patient tends to make inappropriate comments. Refusing all oral pain relief. When asked about his pain control with IV medication, stated the same pain level. Discussed using oral medication with him since IV pain medication if not helping, was told he only said that because he was being . Used profanity in his reply towards me. Calling me a vulgar name.

## 2020-01-03 NOTE — Progress Notes (Signed)
[place] [facility] [place] NAME: John Cline    MR#:  [id number]  DATE OF BIRTH:  11-25-1985  SUBJECTIVE:   Pt has been asking IV pain meds on the dot. He is very sleepy during my conversation. Keeps rubbing his skin c/o itching Per RN pt had ordered some food thru [place] [employer] this morning C/o pain all the time per RN Gets irritable with me when I asked him details about his pain meds and details regarding his pain REVIEW OF SYSTEMS:   Review of Systems  Unable to perform ROS: Other  pt quiet irritable and did not answer my questions Tolerating Diet: CLD--advance to soft Tolerating PT: not needed  DRUG ALLERGIES:   Allergies  Allergen Reactions  . Dapsone Other (See Comments)    Methemoglobinemia    Other reaction(s): Other (See Comments) Childhood allergies methemoglobinemia Other reaction(s): Unknown   . Morphine Hives and Rash    Per patient, this is just redness and itching at injection site. Confusion Other reaction(s): Unknown   . Lipase Other (See Comments)    Constipation  Other reaction(s): Other (See Comments) Constipation Other reaction(s): Unknown  . Morphine And Related Rash    VITALS:  Blood pressure (!) 116/95, pulse 73, temperature 98.1 F (36.7 C), temperature source Oral, resp. rate 16, height 6' (1.829 m), weight 54.4 kg, SpO2 92 %.  PHYSICAL EXAMINATION:   Physical Exam  GENERAL:  34 y.o.-year-old patient lying in the bed with no acute distress.  Chronically ill, disheveled EYES: Pupils equal, round, reactive to light and accommodation. No scleral icterus.   HEENT: Head atraumatic, normocephalic. Oropharynx and nasopharynx clear.  NECK:  Supple, no jugular venous distention. No thyroid enlargement, no tenderness.  LUNGS: Normal breath sounds bilaterally, no wheezing, rales, rhonchi. No use of accessory muscles of respiration.  CARDIOVASCULAR: S1, S2 normal. No murmurs, rubs, or gallops.  ABDOMEN:  Soft, nontender, nondistended. Bowel sounds present. No organomegaly or mass. PD+ EXTREMITIES: No cyanosis, clubbing or edema b/l.    NEUROLOGIC: Cranial nerves II through XII are intact. No focal Motor or sensory deficits b/l.   PSYCHIATRIC:  patient is alert and oriented x 3.  SKIN: No obvious rash, lesion, or ulcer.   LABORATORY PANEL:  CBC Recent Labs  Lab 01/03/20 0449  WBC 5.7  HGB 10.0*  HCT 27.9*  PLT 265    Chemistries  Recent Labs  Lab 12/29/19 2029 12/30/19 0552 01/01/20 0656 01/02/20 0433 01/03/20 0449  NA   < >  --  131*   < > 130*  K   < >  --  3.1*   < > 3.2*  CL   < >  --  80*   < > 82*  CO2   < >  --  27   < > 32  GLUCOSE   < >  --  93   < > 78  BUN   < >  --  63*   < > 63*  CREATININE   < >  --  7.55*   < > 7.23*  CALCIUM   < >  --  9.0   < > 8.5*  MG  --  2.1  --   --   --   AST   < >  --  34  --   --   ALT   < >  --  15  --   --   ALKPHOS   < >  --  76  --   --   BILITOT   < >  --  0.8  --   --    < > = values in this interval not displayed.   Cardiac Enzymes No results for input(s): TROPONINI in the last 168 hours. RADIOLOGY:  No results found. ASSESSMENT AND PLAN:  34 year old male with history of stage renal disease on peritoneal dialysis, IgA nephropathy, renal transplant in the past, admitted to the hospital with acute on chronic pancreatitis.  Admitted for abdominal pain management.  Nephrology following for peritoneal dialysis.  1. Chronic abdominal pain with history of chronic pancreatitis  -patient has had multiple admissions in different hospitals most recent want at [facility] April 12-16th from chronic abdominal pain. No exact etiology has been established. Infection was ruled out. -Patient then came to the ER at [facility] 4/17-- against medical advice -patient came back on 4/19 with same complaints got admitted - Continue symptomatic management.  CT abdomen/pelvis unrevealing for acute process.  Currently on clear liquids.  Advance diet as  tolerated.   -Continue with pancreatic enzymes.   -Request fluid analysis from peritoneal fluid-- rare WBC no organism seen -no clear evidence of acute pancreatitis noted clinically and on CT of the abdomen -patient has been asking IV pain meds and oral pain meds on the dot. -He has been ordering food through [employer]-- per staff. When discussed with patient regarding the same he got upset with me and said he will not eat hospital food. -His clinical symptoms, labs and amount of pain medication requirement do not jive together . -I have told him I will be decreasing his dose of IV pain medication and will continue oral pain meds. -I have asked him to follow-up with pain clinic here at [facility] or [facility] for better pain management. -If he remains stable I do plan to discharge him tomorrow  2. End-stage renal disease on peritoneal dialysis.  Appreciate nephrology assistance.  -cont PD  3. History of renal transplant.  Continued on immunosuppressive therapy. - Would continue home regimen of azathioprine, cyclosporine, prednisone. -pt follows at [facility] nephrology and is on the transplant list  4. Atrial fibrillation.  Rate is currently controlled.  Continue on carvedilol and diltiazem.  He is anticoagulated with apixaban.  5. Seizure disorder.  Stable.  Continue on home dose of Keppra and Vimpat.  6. Chronic hydronephrosis of transplanted kidney.  Has been present for several months.  Likely related to reflux.   Procedures:none Family communication :none Consults :nephorlogy CODE STATUS: full DVT Prophylaxis :eliquis  Status is: Inpatient Dispo: The patient is from: home              Anticipated d/c is QM:GQQP              Anticipated d/c date is: 4/22              Patient currently stable  TOTAL TIME TAKING CARE OF THIS PATIENT: *25* minutes.  >50% time spent on counselling and coordination of care  Note: This dictation was prepared with Dragon dictation along with smaller phrase  technology. Any transcriptional errors that result from this process are unintentional.  Fritzi Mandes M.D    [facility]   CC: Primary care physician; Servando Snare, MDPatient ID: Baldo Daub., male   DOB: 08-13-1986, 34 y.o.   MRN: [id number]

## 2020-01-03 NOTE — Progress Notes (Signed)
[facility]  ROUNDING NOTE   Subjective:  Patient tolerated peritoneal dialysis well overnight. Still having some abdominal pain.   Objective:  Vital signs in last 24 hours:  Temp:  [98.1 F (36.7 C)-98.2 F (36.8 C)] 98.1 F (36.7 C) (04/21 0503) Pulse Rate:  [73-109] 73 (04/21 0503) Resp:  [14-17] 16 (04/21 0503) BP: (114-120)/(79-95) 116/95 (04/21 0503) SpO2:  [92 %-98 %] 92 % (04/21 0503)  Weight change:  Filed Weights   01/01/20 0658  Weight: 54.4 kg    Intake/Output: I/O last 3 completed shifts: In: -  Out: 300 [Urine:300]   Intake/Output this shift:  No intake/output data recorded.  Physical Exam: General: No acute distress  Head: Normocephalic, atraumatic. Moist oral mucosal membranes  Eyes: Anicteric  Neck: Supple, trachea midline  Lungs:  Clear to auscultation, normal effort  Heart: S1S2 no rubs  Abdomen:  Mild epigastric tenderness noted.  Bowel sounds present  Extremities: Trace peripheral edema.  Neurologic: Awake, alert, following commands  Skin: No lesions  Access:  PD catheter in place    Basic Metabolic Panel: Recent Labs  Lab 12/29/19 2029 12/29/19 2029 12/30/19 0552 01/01/20 0656 01/02/20 0433 01/03/20 0449  NA 131*  --   --  131* 130* 130*  K 2.9*  --   --  3.1* 3.3* 3.2*  CL 83*  --   --  80* 79* 82*  CO2 27  --   --  27 36* 32  GLUCOSE 101*  --   --  93 94 78  BUN 58*  --   --  63* 66* 63*  CREATININE 7.36*  --   --  7.55* 7.79* 7.23*  CALCIUM 9.1   < >  --  9.0 8.7* 8.5*  MG  --   --  2.1  --   --   --   PHOS  --   --   --   --   --  6.2*   < > = values in this interval not displayed.    Liver Function Tests: Recent Labs  Lab 12/29/19 2029 01/01/20 0656 01/03/20 0449  AST 29 34  --   ALT 13 15  --   ALKPHOS 86 76  --   BILITOT 2.0* 0.8  --   PROT 7.3 6.3*  --   ALBUMIN 4.0 3.5 3.5   Recent Labs  Lab 12/29/19 2029 01/01/20 0656  LIPASE 36 59*   No results for input(s): AMMONIA in the last 168  hours.  CBC: Recent Labs  Lab 12/29/19 2029 01/01/20 0656 01/02/20 0433 01/03/20 0449  WBC 5.8 8.3 7.1 5.7  HGB 12.4* 11.0* 9.5* 10.0*  HCT 33.7* 33.8* 27.4* 27.9*  MCV 91.6 103.7* 97.5 94.9  PLT 381 262 241 265    Cardiac Enzymes: No results for input(s): CKTOTAL, CKMB, CKMBINDEX, TROPONINI in the last 168 hours.  BNP: Invalid input(s): POCBNP  CBG: Recent Labs  Lab 01/02/20 0746  GLUCAP 96    Microbiology: Results for orders placed or performed during the hospital encounter of 01/01/20  Respiratory Panel by RT PCR (Flu A&B, Covid) - Nasopharyngeal Swab     Status: None   Collection Time: 01/01/20 11:31 AM   Specimen: Nasopharyngeal Swab  Result Value Ref Range Status   SARS Coronavirus 2 by RT PCR NEGATIVE NEGATIVE Final    Comment: (NOTE) SARS-CoV-2 target nucleic acids are NOT DETECTED. The SARS-CoV-2 RNA is generally detectable in upper respiratoy specimens during the acute phase of infection.  The lowest concentration of SARS-CoV-2 viral copies this assay can detect is 131 copies/mL. A negative result does not preclude SARS-Cov-2 infection and should not be used as the sole basis for treatment or other patient management decisions. A negative result may occur with  improper specimen collection/handling, submission of specimen other than nasopharyngeal swab, presence of viral mutation(s) within the areas targeted by this assay, and inadequate number of viral copies (<131 copies/mL). A negative result must be combined with clinical observations, patient history, and epidemiological information. The expected result is Negative. Fact Sheet for Patients:  PinkCheek.be Fact Sheet for Healthcare Providers:  GravelBags.it This test is not yet ap proved or cleared by the [place] FDA and  has been authorized for detection and/or diagnosis of SARS-CoV-2 by FDA under an Emergency Use Authorization (EUA).  This EUA will remain  in effect (meaning this test can be used) for the duration of the COVID-19 declaration under Section 564(b)(1) of the Act, 21 U.S.C. section 360bbb-3(b)(1), unless the authorization is terminated or revoked sooner.    Influenza A by PCR NEGATIVE NEGATIVE Final   Influenza B by PCR NEGATIVE NEGATIVE Final    Comment: (NOTE) The Xpert Xpress SARS-CoV-2/FLU/RSV assay is intended as an aid in  the diagnosis of influenza from Nasopharyngeal swab specimens and  should not be used as a sole basis for treatment. Nasal washings and  aspirates are unacceptable for Xpert Xpress SARS-CoV-2/FLU/RSV  testing. Fact Sheet for Patients: PinkCheek.be Fact Sheet for Healthcare Providers: GravelBags.it This test is not yet approved or cleared by the [place] FDA and  has been authorized for detection and/or diagnosis of SARS-CoV-2 by  FDA under an Emergency Use Authorization (EUA). This EUA will remain  in effect (meaning this test can be used) for the duration of the  Covid-19 declaration under Section 564(b)(1) of the Act, 21  U.S.C. section 360bbb-3(b)(1), unless the authorization is  terminated or revoked. Performed at [facility], [address] [place], [place] [address]   Body fluid culture     Status: None (Preliminary result)   Collection Time: 01/02/20 11:15 PM   Specimen: Peritoneal Washings; Body Fluid  Result Value Ref Range Status   Specimen Description   Final    PERITONEAL Performed at [facility], [address] [place], [place] [address]    Special Requests   Final    NONE Performed at [facility], [address] [place], [place] [address]    Gram Stain   Final    RARE WBC PRESENT, PREDOMINANTLY MONONUCLEAR NO ORGANISMS SEEN Performed at [facility], [place] [address], [place], [place] [address]    Culture PENDING  Incomplete   Report Status PENDING   Incomplete    Coagulation Studies: No results for input(s): LABPROT, INR in the last 72 hours.  Urinalysis: No results for input(s): COLORURINE, LABSPEC, PHURINE, GLUCOSEU, HGBUR, BILIRUBINUR, KETONESUR, PROTEINUR, UROBILINOGEN, NITRITE, LEUKOCYTESUR in the last 72 hours.  Invalid input(s): APPERANCEUR    Imaging: No results found.   Medications:   . dialysis solution 1.5% low-MG/low-CA     . amLODipine  10 mg Oral Daily  . apixaban  2.5 mg Oral BID  . aspirin EC  81 mg Oral Daily  . [START ON 01/04/2020] azaTHIOprine  75 mg Oral Daily  . carvedilol  37.5 mg Oral BID WC  . cinacalcet  30 mg Oral Daily  . cycloSPORINE  100 mg Oral q morning - 10a   And  .  cycloSPORINE  75 mg Oral QHS  . diltiazem  120 mg Oral Daily  . DULoxetine  30 mg Oral Daily  . feeding supplement (NEPRO CARB STEADY)  237 mL Oral BID BM  . gabapentin  300 mg Oral BID  . gentamicin cream  1 application Topical Daily  . lacosamide  100 mg Oral BID  . lamoTRIgine  100 mg Oral Daily  . levETIRAcetam  1,000 mg Oral QHS  . mirtazapine  7.5 mg Oral QHS  . [START ON 01/04/2020] multivitamin  1 tablet Oral QHS  . potassium chloride  20 mEq Oral Once  . predniSONE  10 mg Oral Daily  . sevelamer carbonate  2,400 mg Oral TID WC  . tamsulosin  0.4 mg Oral Daily  . torsemide  20 mg Oral Daily  . vitamin B-12  1,000 mcg Oral Daily  . [START ON 01/13/2020] Vitamin D (Ergocalciferol)  50,000 Units Oral Q30 days   acetaminophen, albuterol, heparin, HYDROmorphone (DILAUDID) injection, hyoscyamine, lipase/protease/amylase, ondansetron **OR** ondansetron (ZOFRAN) IV, oxyCODONE  Assessment/ Plan:  34 y.o. 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  [facility]  1.  ESRD on PD.  We will maintain the patient on current PD prescription using 1.5% dextrose solution with 2 L fill volumes x4 exchanges.  2.  Anemia of chronic kidney disease.  Hold off on Epogen at this time.  3.  Secondary hyperparathyroidism.  Maintain the patient on current doses of Renvela and cinacalcet.  4.  Abdominal pain.  Peritoneal fluid culture taken.  Still pending.  Had this performed at [facility] recently as well which was negative.  No organisms seen on smear.   LOS: 1 John Cline 4/21/20213:31 PM

## 2020-01-04 DIAGNOSIS — Z940 Kidney transplant status: Secondary | ICD-10-CM

## 2020-01-04 DIAGNOSIS — R109 Unspecified abdominal pain: Secondary | ICD-10-CM

## 2020-01-04 DIAGNOSIS — Z992 Dependence on renal dialysis: Secondary | ICD-10-CM

## 2020-01-04 DIAGNOSIS — G8929 Other chronic pain: Secondary | ICD-10-CM

## 2020-01-04 LAB — RENAL FUNCTION PANEL
Albumin: 3.1 g/dL — ABNORMAL LOW (ref 3.5–5.0)
Anion gap: 13 (ref 5–15)
BUN: 62 mg/dL — ABNORMAL HIGH (ref 6–20)
CO2: 32 mmol/L (ref 22–32)
Calcium: 7.8 mg/dL — ABNORMAL LOW (ref 8.9–10.3)
Chloride: 83 mmol/L — ABNORMAL LOW (ref 98–111)
Creatinine, Ser: 6.42 mg/dL — ABNORMAL HIGH (ref 0.61–1.24)
GFR calc Af Amer: 12 mL/min — ABNORMAL LOW (ref >60)
GFR calc non Af Amer: 10 mL/min — ABNORMAL LOW (ref >60)
Glucose, Bld: 82 mg/dL (ref 70–99)
Phosphorus: 4.5 mg/dL (ref 2.5–4.6)
Potassium: 3.2 mmol/L — ABNORMAL LOW (ref 3.5–5.1)
Sodium: 128 mmol/L — ABNORMAL LOW (ref 135–145)

## 2020-01-04 LAB — MAGNESIUM: Magnesium: 2 mg/dL (ref 1.7–2.4)

## 2020-01-04 LAB — LIPASE, BLOOD: Lipase: 51 U/L (ref 11–51)

## 2020-01-04 MED ORDER — HYDROMORPHONE HCL 1 MG/ML IJ SOLN
1.0000 mg | Freq: Once | INTRAMUSCULAR | Status: AC
  Administered 2020-01-04: 1 mg via INTRAVENOUS
  Filled 2020-01-04: qty 1

## 2020-01-04 MED ORDER — POTASSIUM CHLORIDE CRYS ER 20 MEQ PO TBCR
20.0000 meq | EXTENDED_RELEASE_TABLET | Freq: Once | ORAL | Status: AC
  Administered 2020-01-04: 20 meq via ORAL
  Filled 2020-01-04: qty 1

## 2020-01-04 MED ORDER — PANCRELIPASE (LIP-PROT-AMYL) 24000-76000 UNITS PO CPEP: 24000.0000 [IU] | ORAL_CAPSULE | Freq: Three times a day (TID) | ORAL | 0 refills | Status: DC | PRN

## 2020-01-04 MED ORDER — CINACALCET HCL 30 MG PO TABS: 30.0000 mg | ORAL_TABLET | Freq: Every day | ORAL | 0 refills | Status: DC

## 2020-01-04 NOTE — Discharge Instructions (Signed)
Resume your PD as before

## 2020-01-04 NOTE — Progress Notes (Signed)
John Cline.  A and O x 4 VSS. Pt tolerating diet well. No complaints of pain or nausea. IV removed intact, prescriptions given. Pt voices understanding of discharge instructions with no further questions. Pt discharged ambulatory, refused assistance.   Allergies as of 01/04/2020      Reactions   Dapsone Other (See Comments)   Methemoglobinemia Other reaction(s): Other (See Comments) Childhood allergies methemoglobinemia Other reaction(s): Unknown   Morphine Hives, Rash   Per patient, this is just redness and itching at injection site. Confusion Other reaction(s): Unknown   Lipase Other (See Comments)   Constipation Other reaction(s): Other (See Comments) Constipation Other reaction(s): Unknown   Morphine And Related Rash      Medication List    TAKE these medications   acetaminophen 500 MG tablet Commonly known as: TYLENOL Take 500-1,000 mg by mouth every 6 (six) hours as needed for mild pain or headache.   amLODipine 10 MG tablet Commonly known as: NORVASC Take 10 mg by mouth daily.   azaTHIOprine 50 MG tablet Commonly known as: IMURAN Take 75 mg by mouth daily.   carvedilol 25 MG tablet Commonly known as: COREG Take 25 mg by mouth 2 (two) times daily with a meal.   cinacalcet 30 MG tablet Commonly known as: SENSIPAR Take 1 tablet (30 mg total) by mouth daily.   cloNIDine 0.2 MG tablet Commonly known as: CATAPRES Take 0.2 mg by mouth 3 (three) times daily.   cycloSPORINE modified 25 MG capsule Commonly known as: NEORAL Take 75-100 mg by mouth 2 (two) times daily. 100MG -AM 75MG -PM   diltiazem 120 MG 24 hr capsule Commonly known as: CARDIZEM CD Take 120 mg by mouth daily.   DULoxetine 30 MG capsule Commonly known as: CYMBALTA Take 30 mg by mouth daily.   Eliquis 2.5 MG Tabs tablet Generic drug: apixaban Take 2.5 mg by mouth 2 (two) times daily.   gabapentin 300 MG capsule Commonly known as: NEURONTIN Take 300 mg by mouth 2 (two) times daily.   Lacosamide 100 MG Tabs Take 1 tablet (100 mg total) by mouth 2 (two) times daily.   lamoTRIgine 100 MG tablet Commonly known as: LAMICTAL Take 100 mg by mouth daily.   levETIRAcetam 1000 MG tablet Commonly known as: KEPPRA Take 1,000 mg by mouth at bedtime.   mirtazapine 15 MG tablet Commonly known as: REMERON Take by mouth. Take 0.5 tablets (7.5mg ) daily at bedtime   ondansetron 4 MG tablet Commonly known as: ZOFRAN Take 4 mg by mouth every 8 (eight) hours as needed for nausea or vomiting.   Pancrelipase (Lip-Prot-Amyl) 24000-76000 units Cpep Take 1 capsule (24,000 Units total) by mouth 3 (three) times daily as needed (with meals). What changed: how much to take   predniSONE 10 MG tablet Commonly known as: DELTASONE Take 10 mg by mouth daily.   ProAir HFA 108 (90 Base) MCG/ACT inhaler Generic drug: albuterol Inhale 2 puffs into the lungs every 4 (four) hours as needed for shortness of breath or wheezing.   sevelamer carbonate 800 MG tablet Commonly known as: RENVELA Take 1,600 mg by mouth 3 (three) times daily with meals.   tamsulosin 0.4 MG Caps capsule Commonly known as: FLOMAX Take 0.4 mg by mouth daily.   torsemide 20 MG tablet Commonly known as: DEMADEX Take 20 mg by mouth daily.   vitamin B-12 1000 MCG tablet Commonly known as: CYANOCOBALAMIN Take 1,000 mcg by mouth daily.   Vitamin D3 1.25 MG (50000 UT) Caps Take 50,000 Units  by mouth every 30 (thirty) days.       Vitals:   01/04/20 0917 01/04/20 0920  BP: 110/77 110/77  Pulse:  69  Resp:    Temp:  98.1 F (36.7 C)  SpO2:  99%    John  Payton Cline

## 2020-01-04 NOTE — Discharge Summary (Signed)
[place] [facility] [place] NAME: John Cline    MR#:  [id number]  DATE OF BIRTH:  1986-09-06  DATE OF ADMISSION:  01/01/2020 ADMITTING PHYSICIAN: Kathie Dike, MD  DATE OF DISCHARGE: 01/04/2020  PRIMARY CARE PHYSICIAN: Servando Snare, MD    ADMISSION DIAGNOSIS:  Chronic abdominal pain [R10.9, G89.29] Peritoneal dialysis status ([place]) [Z99.2] Chronic pancreatitis, unspecified pancreatitis type ([place]) [K86.1] Acute on chronic pancreatitis ([place]) [K85.90, K86.1] Intractable vomiting with nausea, unspecified vomiting type [R11.2]  DISCHARGE DIAGNOSIS:  Chronic Abdominal pain with extensive w/u in the past   SECONDARY DIAGNOSIS:   Past Medical History:  Diagnosis Date  . Atrial fibrillation ([place])   . Hypertension   . Seizures [facility])     HOSPITAL COURSE:   34 year old male with history of stage renal disease on peritoneal dialysis, IgA nephropathy, renal transplant in the past, admitted to the hospital with acute on chronic pancreatitis. Admitted for abdominal pain management. Nephrology following for peritoneal dialysis.  1. Chronic abdominal pain with history of chronic pancreatitis  -patient has had multiple admissions in different hospitals most recent want at [facility] April 12-16th/2021  for chronic abdominal pain. No exact etiology has been established. Infection was ruled out. -Patient then came to the ER at [facility] 4/17-- against medical advice -patient came back on 4/19 with same complaints got admitted -Continue symptomatic management. CT abdomen/pelvis unrevealing for acute process. Currently on clear liquids. Advance diet as tolerated.  -Continue with pancreatic enzyme- creon - fluid analysis from peritoneal fluid-- rare WBC no organism seen -no clear evidence of acute pancreatitis noted clinically and on CT of the abdomen -patient has been asking IV pain meds and oral pain meds on the dot. -tolerating regular diet -His  clinical symptoms, labs and amount of pain medication requirement do not jive together --I feel he is abusing pain meds! -I have asked him to follow-up with pain clinic here at [facility] or [facility] for better pain management.  2. End-stage renal disease on peritoneal dialysis. Appreciate nephrology assistance.  -cont PD  3. History of renal transplant. Continued on immunosuppressive therapy. -Would continue home regimen of azathioprine, cyclosporine, prednisone. -pt follows at [facility] nephrology and is on the transplant list  4. Atrial fibrillation. Rate is currently controlled. Continue on carvedilol and diltiazem. He is anticoagulated with apixaban.  5. Seizure disorder. Stable. Continue on home dose of Keppra and Vimpat.  6. Chronic hydronephrosis of transplanted kidney. Has been present for several months. Likely related to reflux.  D/w pt in presence of Unit director Elberta Leatherwood, pt's RN in the room. He will d/c to home today. Father will pick him up. I will not be prescribing any pain meds since pt mentioned about not caring for po pain meds  Procedures:none Family communication :none Consults :nephorlogy CODE STATUS: full DVT Prophylaxis :eliquis  Status is: Inpatient Dispo: The patient is from: home  Anticipated d/c is HD:QQIW  Anticipated d/c date is: 4/22  Patient currently stable  CONSULTS OBTAINED:    DRUG ALLERGIES:   Allergies  Allergen Reactions  . Dapsone Other (See Comments)    Methemoglobinemia    Other reaction(s): Other (See Comments) Childhood allergies methemoglobinemia Other reaction(s): Unknown   . Morphine Hives and Rash    Per patient, this is just redness and itching at injection site. Confusion Other reaction(s): Unknown   . Lipase Other (See Comments)    Constipation  Other reaction(s): Other (See Comments) Constipation Other reaction(s): Unknown  . Morphine And  Related Rash    DISCHARGE  MEDICATIONS:   Allergies as of 01/04/2020      Reactions   Dapsone Other (See Comments)   Methemoglobinemia Other reaction(s): Other (See Comments) Childhood allergies methemoglobinemia Other reaction(s): Unknown   Morphine Hives, Rash   Per patient, this is just redness and itching at injection site. Confusion Other reaction(s): Unknown   Lipase Other (See Comments)   Constipation Other reaction(s): Other (See Comments) Constipation Other reaction(s): Unknown   Morphine And Related Rash      Medication List    TAKE these medications   acetaminophen 500 MG tablet Commonly known as: TYLENOL Take 500-1,000 mg by mouth every 6 (six) hours as needed for mild pain or headache.   amLODipine 10 MG tablet Commonly known as: NORVASC Take 10 mg by mouth daily.   azaTHIOprine 50 MG tablet Commonly known as: IMURAN Take 75 mg by mouth daily.   carvedilol 25 MG tablet Commonly known as: COREG Take 25 mg by mouth 2 (two) times daily with a meal.   cinacalcet 30 MG tablet Commonly known as: SENSIPAR Take 1 tablet (30 mg total) by mouth daily.   cloNIDine 0.2 MG tablet Commonly known as: CATAPRES Take 0.2 mg by mouth 3 (three) times daily.   cycloSPORINE modified 25 MG capsule Commonly known as: NEORAL Take 75-100 mg by mouth 2 (two) times daily. 100MG -AM 75MG -PM   diltiazem 120 MG 24 hr capsule Commonly known as: CARDIZEM CD Take 120 mg by mouth daily.   DULoxetine 30 MG capsule Commonly known as: CYMBALTA Take 30 mg by mouth daily.   Eliquis 2.5 MG Tabs tablet Generic drug: apixaban Take 2.5 mg by mouth 2 (two) times daily.   gabapentin 300 MG capsule Commonly known as: NEURONTIN Take 300 mg by mouth 2 (two) times daily.   Lacosamide 100 MG Tabs Take 1 tablet (100 mg total) by mouth 2 (two) times daily.   lamoTRIgine 100 MG tablet Commonly known as: LAMICTAL Take 100 mg by mouth daily.   levETIRAcetam 1000 MG tablet Commonly known as: KEPPRA Take 1,000  mg by mouth at bedtime.   mirtazapine 15 MG tablet Commonly known as: REMERON Take by mouth. Take 0.5 tablets (7.5mg ) daily at bedtime   ondansetron 4 MG tablet Commonly known as: ZOFRAN Take 4 mg by mouth every 8 (eight) hours as needed for nausea or vomiting.   Pancrelipase (Lip-Prot-Amyl) 24000-76000 units Cpep Take 1 capsule (24,000 Units total) by mouth 3 (three) times daily as needed (with meals). What changed: how much to take   predniSONE 10 MG tablet Commonly known as: DELTASONE Take 10 mg by mouth daily.   ProAir HFA 108 (90 Base) MCG/ACT inhaler Generic drug: albuterol Inhale 2 puffs into the lungs every 4 (four) hours as needed for shortness of breath or wheezing.   sevelamer carbonate 800 MG tablet Commonly known as: RENVELA Take 1,600 mg by mouth 3 (three) times daily with meals.   tamsulosin 0.4 MG Caps capsule Commonly known as: FLOMAX Take 0.4 mg by mouth daily.   torsemide 20 MG tablet Commonly known as: DEMADEX Take 20 mg by mouth daily.   vitamin B-12 1000 MCG tablet Commonly known as: CYANOCOBALAMIN Take 1,000 mcg by mouth daily.   Vitamin D3 1.25 MG (50000 UT) Caps Take 50,000 Units by mouth every 30 (thirty) days.       If you experience worsening of your admission symptoms, develop shortness of breath, life threatening emergency, suicidal or homicidal thoughts you  must seek medical attention immediately by calling 911 or calling your MD immediately  if symptoms less severe.  You Must read complete instructions/literature along with all the possible adverse reactions/side effects for all the Medicines you take and that have been prescribed to you. Take any new Medicines after you have completely understood and accept all the possible adverse reactions/side effects.   Please note  You were cared for by a hospitalist during your hospital stay. If you have any questions about your discharge medications or the care you received while you were in  the hospital after you are discharged, you can call the unit and asked to speak with the hospitalist on call if the hospitalist that took care of you is not available. Once you are discharged, your primary care physician will handle any further medical issues. Please note that NO REFILLS for any discharge medications will be authorized once you are discharged, as it is imperative that you return to your primary care physician (or establish a relationship with a primary care physician if you do not have one) for your aftercare needs so that they can reassess your need for medications and monitor your lab values. Today   SUBJECTIVE   Pain all over  VITAL SIGNS:  Blood pressure 110/77, pulse 69, temperature 98.1 F (36.7 C), temperature source Oral, resp. rate 18, height 6' (1.829 m), weight 54.4 kg, SpO2 99 %.  I/O:    Intake/Output Summary (Last 24 hours) at 01/04/2020 1110 Last data filed at 01/04/2020 0500 Gross per 24 hour  Intake --  Output 100 ml  Net -100 ml    PHYSICAL EXAMINATION:  GENERAL:  34 y.o.-year-old patient lying in the bed with no acute distress.  EYES: Pupils equal, round, reactive to light and accommodation. No scleral icterus.  HEENT: Head atraumatic, normocephalic. Oropharynx and nasopharynx clear.  NECK:  Supple, no jugular venous distention. No thyroid enlargement, no tenderness.  LUNGS: Normal breath sounds bilaterally, no wheezing, rales,rhonchi or crepitation. No use of accessory muscles of respiration.  CARDIOVASCULAR: S1, S2 normal. No murmurs, rubs, or gallops.  ABDOMEN: Soft, non-tender, non-distended. Bowel sounds present. No organomegaly or mass. Surgical healed scars+ EXTREMITIES: No pedal edema, cyanosis, or clubbing.  NEUROLOGIC: Cranial nerves II through XII are intact. Muscle strength 5/5 in all extremities. Sensation intact. Gait not checked.  PSYCHIATRIC: The patient is alert and oriented x 3.  SKIN: No obvious rash, lesion, or ulcer.   DATA  REVIEW:   CBC  Recent Labs  Lab 01/03/20 0449  WBC 5.7  HGB 10.0*  HCT 27.9*  PLT 265    Chemistries  Recent Labs  Lab 01/01/20 0656 01/02/20 0433 01/04/20 0406  NA 131*   < > 128*  K 3.1*   < > 3.2*  CL 80*   < > 83*  CO2 27   < > 32  GLUCOSE 93   < > 82  BUN 63*   < > 62*  CREATININE 7.55*   < > 6.42*  CALCIUM 9.0   < > 7.8*  MG  --   --  2.0  AST 34  --   --   ALT 15  --   --   ALKPHOS 76  --   --   BILITOT 0.8  --   --    < > = values in this interval not displayed.    Microbiology Results   Recent Results (from the past 240 hour(s))  Respiratory Panel by RT PCR (  Flu A&B, Covid) - Nasopharyngeal Swab     Status: None   Collection Time: 01/01/20 11:31 AM   Specimen: Nasopharyngeal Swab  Result Value Ref Range Status   SARS Coronavirus 2 by RT PCR NEGATIVE NEGATIVE Final    Comment: (NOTE) SARS-CoV-2 target nucleic acids are NOT DETECTED. The SARS-CoV-2 RNA is generally detectable in upper respiratoy specimens during the acute phase of infection. The lowest concentration of SARS-CoV-2 viral copies this assay can detect is 131 copies/mL. A negative result does not preclude SARS-Cov-2 infection and should not be used as the sole basis for treatment or other patient management decisions. A negative result may occur with  improper specimen collection/handling, submission of specimen other than nasopharyngeal swab, presence of viral mutation(s) within the areas targeted by this assay, and inadequate number of viral copies (<131 copies/mL). A negative result must be combined with clinical observations, patient history, and epidemiological information. The expected result is Negative. Fact Sheet for Patients:  PinkCheek.be Fact Sheet for Healthcare Providers:  GravelBags.it This test is not yet ap proved or cleared by the [place] FDA and  has been authorized for detection and/or diagnosis of  SARS-CoV-2 by FDA under an Emergency Use Authorization (EUA). This EUA will remain  in effect (meaning this test can be used) for the duration of the COVID-19 declaration under Section 564(b)(1) of the Act, 21 U.S.C. section 360bbb-3(b)(1), unless the authorization is terminated or revoked sooner.    Influenza A by PCR NEGATIVE NEGATIVE Final   Influenza B by PCR NEGATIVE NEGATIVE Final    Comment: (NOTE) The Xpert Xpress SARS-CoV-2/FLU/RSV assay is intended as an aid in  the diagnosis of influenza from Nasopharyngeal swab specimens and  should not be used as a sole basis for treatment. Nasal washings and  aspirates are unacceptable for Xpert Xpress SARS-CoV-2/FLU/RSV  testing. Fact Sheet for Patients: PinkCheek.be Fact Sheet for Healthcare Providers: GravelBags.it This test is not yet approved or cleared by the [place] FDA and  has been authorized for detection and/or diagnosis of SARS-CoV-2 by  FDA under an Emergency Use Authorization (EUA). This EUA will remain  in effect (meaning this test can be used) for the duration of the  Covid-19 declaration under Section 564(b)(1) of the Act, 21  U.S.C. section 360bbb-3(b)(1), unless the authorization is  terminated or revoked. Performed at [facility], [address] [place], [place] [address]   Body fluid culture     Status: None (Preliminary result)   Collection Time: 01/02/20 11:15 PM   Specimen: Peritoneal Washings; Body Fluid  Result Value Ref Range Status   Specimen Description   Final    PERITONEAL Performed at [facility], [address] [place], [place] [address]    Special Requests   Final    NONE Performed at [facility], [address] [place], [place] [address]    Gram Stain   Final    RARE WBC PRESENT, PREDOMINANTLY MONONUCLEAR NO ORGANISMS SEEN    Culture   Final    NO GROWTH 1 DAY Performed at [facility], [place] [address], [place], [place] [address]    Report Status PENDING  Incomplete    RADIOLOGY:  No results found.   CODE STATUS:     Code Status Orders  (From admission, onward)         Start     Ordered   01/01/20 1346  Full code  Continuous     01/01/20 1355  Code Status History    Date Active Date Inactive Code Status Order ID Comments User Context   12/30/2019 0841 12/30/2019 2011 Full Code [id number]  Collier Bullock, MD ED   03/26/2019 1834 03/31/2019 1950 Full Code [id number]  Welford Roche, MD ED   Advance Care Planning Activity       TOTAL TIME TAKING CARE OF THIS PATIENT: *40* minutes.    Fritzi Mandes M.D  [facility]    CC: Primary care physician; Servando Snare, MD

## 2020-01-06 LAB — BODY FLUID CULTURE: Culture: NO GROWTH

## 2020-01-21 DIAGNOSIS — Z8719 Personal history of other diseases of the digestive system: Secondary | ICD-10-CM | POA: Insufficient documentation

## 2020-01-30 ENCOUNTER — Emergency Department
Admission: EM | Admit: 2020-01-30 | Discharge: 2020-01-30 | Disposition: A | Discharge status: Home / Self Care | Payer: Medicare Other | Attending: Emergency Medicine | Admitting: Emergency Medicine

## 2020-01-30 ENCOUNTER — Other Ambulatory Visit: Payer: Self-pay

## 2020-01-30 DIAGNOSIS — R1110 Vomiting, unspecified: Secondary | ICD-10-CM | POA: Diagnosis not present

## 2020-01-30 DIAGNOSIS — R0789 Other chest pain: Secondary | ICD-10-CM | POA: Diagnosis not present

## 2020-01-30 DIAGNOSIS — R109 Unspecified abdominal pain: Secondary | ICD-10-CM | POA: Diagnosis not present

## 2020-01-30 DIAGNOSIS — Z5321 Procedure and treatment not carried out due to patient leaving prior to being seen by health care provider: Secondary | ICD-10-CM | POA: Insufficient documentation

## 2020-01-30 LAB — COMPREHENSIVE METABOLIC PANEL
ALT: 14 U/L (ref 0–44)
AST: 26 U/L (ref 15–41)
Albumin: 4 g/dL (ref 3.5–5.0)
Alkaline Phosphatase: 67 U/L (ref 38–126)
Anion gap: 14 (ref 5–15)
BUN: 49 mg/dL — ABNORMAL HIGH (ref 6–20)
CO2: 25 mmol/L (ref 22–32)
Calcium: 8.9 mg/dL (ref 8.9–10.3)
Chloride: 91 mmol/L — ABNORMAL LOW (ref 98–111)
Creatinine, Ser: 5.99 mg/dL — ABNORMAL HIGH (ref 0.61–1.24)
GFR calc Af Amer: 13 mL/min — ABNORMAL LOW (ref >60)
GFR calc non Af Amer: 11 mL/min — ABNORMAL LOW (ref >60)
Glucose, Bld: 124 mg/dL — ABNORMAL HIGH (ref 70–99)
Potassium: 4 mmol/L (ref 3.5–5.1)
Sodium: 130 mmol/L — ABNORMAL LOW (ref 135–145)
Total Bilirubin: 1.1 mg/dL (ref 0.3–1.2)
Total Protein: 6.9 g/dL (ref 6.5–8.1)

## 2020-01-30 LAB — CBC
HCT: 31.3 % — ABNORMAL LOW (ref 39.0–52.0)
Hemoglobin: 11.3 g/dL — ABNORMAL LOW (ref 13.0–17.0)
MCH: 33.7 pg (ref 26.0–34.0)
MCHC: 36.1 g/dL — ABNORMAL HIGH (ref 30.0–36.0)
MCV: 93.4 fL (ref 80.0–100.0)
Platelets: 298 10*3/uL (ref 150–400)
RBC: 3.35 MIL/uL — ABNORMAL LOW (ref 4.22–5.81)
RDW: 12.5 % (ref 11.5–15.5)
WBC: 3.6 10*3/uL — ABNORMAL LOW (ref 4.0–10.5)
nRBC: 0 % (ref 0.0–0.2)

## 2020-01-30 LAB — LIPASE, BLOOD: Lipase: 56 U/L — ABNORMAL HIGH (ref 11–51)

## 2020-01-30 LAB — TROPONIN I (HIGH SENSITIVITY): Troponin I (High Sensitivity): 12 ng/L (ref <18)

## 2020-01-30 MED ORDER — SODIUM CHLORIDE 0.9% FLUSH: 3.0000 mL | Freq: Once | INTRAVENOUS | Status: DC

## 2020-01-30 MED ORDER — ONDANSETRON 4 MG PO TBDP
4.0000 mg | ORAL_TABLET | Freq: Once | ORAL | Status: DC | PRN
  Filled 2020-01-30: qty 1

## 2020-01-30 NOTE — ED Notes (Signed)
Pt called in the WR with no response 

## 2020-01-30 NOTE — ED Triage Notes (Signed)
Pt presents to ED with c/o chest and abdominal pain. Pt states was seen at [facility] yesterday for multiple episodes of vomiting. Upon this RN arrival to triage room patient not to be sticking his fingers down his throat. Pt states was laying in the bed this morning. Pt states sudden onset substernal CP that started this morning. Pt states hx of peritoneal dialysis.   Pt actively vomiting in triage after sticking his finger down his throat. Pt states had PD at 1130am this morning.

## 2020-01-30 NOTE — ED Notes (Signed)
Pt called in the Wr with no response,.

## 2020-02-01 ENCOUNTER — Encounter (HOSPITAL_COMMUNITY): Payer: Self-pay | Admitting: *Deleted

## 2020-02-01 ENCOUNTER — Emergency Department (HOSPITAL_COMMUNITY)
Admission: EM | Admit: 2020-02-01 | Discharge: 2020-02-02 | Disposition: A | Discharge status: Home / Self Care | Payer: Medicare Other | Attending: Emergency Medicine | Admitting: Emergency Medicine

## 2020-02-01 ENCOUNTER — Other Ambulatory Visit: Payer: Self-pay

## 2020-02-01 DIAGNOSIS — R112 Nausea with vomiting, unspecified: Secondary | ICD-10-CM | POA: Insufficient documentation

## 2020-02-01 DIAGNOSIS — Z5321 Procedure and treatment not carried out due to patient leaving prior to being seen by health care provider: Secondary | ICD-10-CM | POA: Insufficient documentation

## 2020-02-01 DIAGNOSIS — R109 Unspecified abdominal pain: Secondary | ICD-10-CM | POA: Diagnosis not present

## 2020-02-01 NOTE — ED Triage Notes (Signed)
Pt says that he has been having abdominal pain, nausea and vomiting blood (twice) since yesterday. Reports hx of ulcer.

## 2020-02-02 LAB — COMPREHENSIVE METABOLIC PANEL
ALT: 11 U/L (ref 0–44)
AST: 19 U/L (ref 15–41)
Albumin: 3.8 g/dL (ref 3.5–5.0)
Alkaline Phosphatase: 71 U/L (ref 38–126)
Anion gap: 17 — ABNORMAL HIGH (ref 5–15)
BUN: 37 mg/dL — ABNORMAL HIGH (ref 6–20)
CO2: 26 mmol/L (ref 22–32)
Calcium: 9.4 mg/dL (ref 8.9–10.3)
Chloride: 91 mmol/L — ABNORMAL LOW (ref 98–111)
Creatinine, Ser: 6.44 mg/dL — ABNORMAL HIGH (ref 0.61–1.24)
GFR calc Af Amer: 12 mL/min — ABNORMAL LOW (ref >60)
GFR calc non Af Amer: 10 mL/min — ABNORMAL LOW (ref >60)
Glucose, Bld: 93 mg/dL (ref 70–99)
Potassium: 3.4 mmol/L — ABNORMAL LOW (ref 3.5–5.1)
Sodium: 134 mmol/L — ABNORMAL LOW (ref 135–145)
Total Bilirubin: 1.1 mg/dL (ref 0.3–1.2)
Total Protein: 6.6 g/dL (ref 6.5–8.1)

## 2020-02-02 LAB — CBC
HCT: 34.9 % — ABNORMAL LOW (ref 39.0–52.0)
Hemoglobin: 12.4 g/dL — ABNORMAL LOW (ref 13.0–17.0)
MCH: 33.8 pg (ref 26.0–34.0)
MCHC: 35.5 g/dL (ref 30.0–36.0)
MCV: 95.1 fL (ref 80.0–100.0)
Platelets: 333 10*3/uL (ref 150–400)
RBC: 3.67 MIL/uL — ABNORMAL LOW (ref 4.22–5.81)
RDW: 12.4 % (ref 11.5–15.5)
WBC: 4.6 10*3/uL (ref 4.0–10.5)
nRBC: 0 % (ref 0.0–0.2)

## 2020-02-02 LAB — LIPASE, BLOOD: Lipase: 67 U/L — ABNORMAL HIGH (ref 11–51)

## 2020-02-02 LAB — TYPE AND SCREEN
ABO/RH(D): A NEG
Antibody Screen: NEGATIVE

## 2020-02-02 NOTE — ED Notes (Signed)
Pt leaving AMA, advised to return if symptoms worsen. 

## 2020-02-13 MED ORDER — OXYCODONE HCL 5 MG PO TABS
5.00 | ORAL_TABLET | ORAL | Status: DC
Start: ? — End: 2020-02-13

## 2020-05-29 ENCOUNTER — Emergency Department: Payer: Medicare Other

## 2020-05-29 ENCOUNTER — Encounter: Payer: Self-pay | Admitting: Emergency Medicine

## 2020-05-29 ENCOUNTER — Other Ambulatory Visit: Payer: Self-pay

## 2020-05-29 ENCOUNTER — Inpatient Hospital Stay
Admission: EM | Admit: 2020-05-29 | Discharge: 2020-06-01 | DRG: 177 | Disposition: A | Discharge status: Other Acute Inpatient Hospital | Payer: Medicare Other | Attending: Hospitalist | Admitting: Hospitalist

## 2020-05-29 DIAGNOSIS — R579 Shock, unspecified: Secondary | ICD-10-CM

## 2020-05-29 DIAGNOSIS — D631 Anemia in chronic kidney disease: Secondary | ICD-10-CM | POA: Diagnosis present

## 2020-05-29 DIAGNOSIS — U071 COVID-19: Secondary | ICD-10-CM | POA: Diagnosis present

## 2020-05-29 DIAGNOSIS — Z9114 Patient's other noncompliance with medication regimen: Secondary | ICD-10-CM | POA: Diagnosis not present

## 2020-05-29 DIAGNOSIS — Z79899 Other long term (current) drug therapy: Secondary | ICD-10-CM | POA: Diagnosis not present

## 2020-05-29 DIAGNOSIS — N2581 Secondary hyperparathyroidism of renal origin: Secondary | ICD-10-CM | POA: Diagnosis present

## 2020-05-29 DIAGNOSIS — K859 Acute pancreatitis without necrosis or infection, unspecified: Secondary | ICD-10-CM | POA: Diagnosis present

## 2020-05-29 DIAGNOSIS — I9589 Other hypotension: Secondary | ICD-10-CM | POA: Diagnosis present

## 2020-05-29 DIAGNOSIS — R109 Unspecified abdominal pain: Secondary | ICD-10-CM | POA: Diagnosis present

## 2020-05-29 DIAGNOSIS — A0839 Other viral enteritis: Secondary | ICD-10-CM | POA: Diagnosis present

## 2020-05-29 DIAGNOSIS — K861 Other chronic pancreatitis: Secondary | ICD-10-CM | POA: Diagnosis present

## 2020-05-29 DIAGNOSIS — Z5329 Procedure and treatment not carried out because of patient's decision for other reasons: Secondary | ICD-10-CM | POA: Diagnosis present

## 2020-05-29 DIAGNOSIS — Z7952 Long term (current) use of systemic steroids: Secondary | ICD-10-CM

## 2020-05-29 DIAGNOSIS — J1282 Pneumonia due to coronavirus disease 2019: Secondary | ICD-10-CM | POA: Diagnosis present

## 2020-05-29 DIAGNOSIS — I480 Paroxysmal atrial fibrillation: Secondary | ICD-10-CM | POA: Diagnosis present

## 2020-05-29 DIAGNOSIS — I4892 Unspecified atrial flutter: Secondary | ICD-10-CM | POA: Diagnosis present

## 2020-05-29 DIAGNOSIS — G8929 Other chronic pain: Secondary | ICD-10-CM | POA: Diagnosis present

## 2020-05-29 DIAGNOSIS — Z992 Dependence on renal dialysis: Secondary | ICD-10-CM

## 2020-05-29 DIAGNOSIS — R188 Other ascites: Secondary | ICD-10-CM | POA: Diagnosis present

## 2020-05-29 DIAGNOSIS — I120 Hypertensive chronic kidney disease with stage 5 chronic kidney disease or end stage renal disease: Secondary | ICD-10-CM | POA: Diagnosis present

## 2020-05-29 DIAGNOSIS — Z885 Allergy status to narcotic agent status: Secondary | ICD-10-CM

## 2020-05-29 DIAGNOSIS — E861 Hypovolemia: Secondary | ICD-10-CM | POA: Diagnosis present

## 2020-05-29 DIAGNOSIS — N186 End stage renal disease: Secondary | ICD-10-CM

## 2020-05-29 DIAGNOSIS — Z7901 Long term (current) use of anticoagulants: Secondary | ICD-10-CM

## 2020-05-29 DIAGNOSIS — G40909 Epilepsy, unspecified, not intractable, without status epilepticus: Secondary | ICD-10-CM | POA: Diagnosis present

## 2020-05-29 DIAGNOSIS — E8809 Other disorders of plasma-protein metabolism, not elsewhere classified: Secondary | ICD-10-CM | POA: Diagnosis present

## 2020-05-29 DIAGNOSIS — F329 Major depressive disorder, single episode, unspecified: Secondary | ICD-10-CM | POA: Diagnosis present

## 2020-05-29 DIAGNOSIS — F32A Depression, unspecified: Secondary | ICD-10-CM | POA: Diagnosis present

## 2020-05-29 DIAGNOSIS — I482 Chronic atrial fibrillation, unspecified: Secondary | ICD-10-CM | POA: Diagnosis not present

## 2020-05-29 DIAGNOSIS — I4891 Unspecified atrial fibrillation: Secondary | ICD-10-CM | POA: Diagnosis present

## 2020-05-29 DIAGNOSIS — N189 Chronic kidney disease, unspecified: Secondary | ICD-10-CM | POA: Diagnosis present

## 2020-05-29 DIAGNOSIS — R1084 Generalized abdominal pain: Secondary | ICD-10-CM

## 2020-05-29 DIAGNOSIS — E871 Hypo-osmolality and hyponatremia: Secondary | ICD-10-CM | POA: Diagnosis present

## 2020-05-29 DIAGNOSIS — Z888 Allergy status to other drugs, medicaments and biological substances status: Secondary | ICD-10-CM

## 2020-05-29 DIAGNOSIS — T8612 Kidney transplant failure: Secondary | ICD-10-CM | POA: Diagnosis present

## 2020-05-29 LAB — GASTROINTESTINAL PANEL BY PCR, STOOL (REPLACES STOOL CULTURE)

## 2020-05-29 LAB — COMPREHENSIVE METABOLIC PANEL
ALT: 15 U/L (ref 0–44)
AST: 21 U/L (ref 15–41)
Albumin: 3.1 g/dL — ABNORMAL LOW (ref 3.5–5.0)
Alkaline Phosphatase: 117 U/L (ref 38–126)
Anion gap: 17 — ABNORMAL HIGH (ref 5–15)
BUN: 91 mg/dL — ABNORMAL HIGH (ref 6–20)
CO2: 23 mmol/L (ref 22–32)
Calcium: 8 mg/dL — ABNORMAL LOW (ref 8.9–10.3)
Chloride: 86 mmol/L — ABNORMAL LOW (ref 98–111)
Creatinine, Ser: 13.62 mg/dL — ABNORMAL HIGH (ref 0.61–1.24)
GFR calc Af Amer: 5 mL/min — ABNORMAL LOW (ref >60)
GFR calc non Af Amer: 4 mL/min — ABNORMAL LOW (ref >60)
Glucose, Bld: 99 mg/dL (ref 70–99)
Potassium: 3.8 mmol/L (ref 3.5–5.1)
Sodium: 126 mmol/L — ABNORMAL LOW (ref 135–145)
Total Bilirubin: 0.8 mg/dL (ref 0.3–1.2)
Total Protein: 6.3 g/dL — ABNORMAL LOW (ref 6.5–8.1)

## 2020-05-29 LAB — PROTIME-INR
INR: 1.2 (ref 0.8–1.2)
Prothrombin Time: 14.6 seconds (ref 11.4–15.2)

## 2020-05-29 LAB — C DIFFICILE QUICK SCREEN W PCR REFLEX
C Diff antigen: NEGATIVE
C Diff interpretation: NOT DETECTED
C Diff toxin: NEGATIVE

## 2020-05-29 LAB — CBC WITH DIFFERENTIAL/PLATELET
Abs Immature Granulocytes: 0.06 10*3/uL (ref 0.00–0.07)
Basophils Absolute: 0 10*3/uL (ref 0.0–0.1)
Basophils Relative: 0 %
Eosinophils Absolute: 0 10*3/uL (ref 0.0–0.5)
Eosinophils Relative: 0 %
HCT: 29 % — ABNORMAL LOW (ref 39.0–52.0)
Hemoglobin: 10.1 g/dL — ABNORMAL LOW (ref 13.0–17.0)
Immature Granulocytes: 1 %
Lymphocytes Relative: 13 %
Lymphs Abs: 0.7 10*3/uL (ref 0.7–4.0)
MCH: 32.4 pg (ref 26.0–34.0)
MCHC: 34.8 g/dL (ref 30.0–36.0)
MCV: 92.9 fL (ref 80.0–100.0)
Monocytes Absolute: 0.4 10*3/uL (ref 0.1–1.0)
Monocytes Relative: 8 %
Neutro Abs: 4.1 10*3/uL (ref 1.7–7.7)
Neutrophils Relative %: 78 %
Platelets: 176 10*3/uL (ref 150–400)
RBC: 3.12 MIL/uL — ABNORMAL LOW (ref 4.22–5.81)
RDW: 14.4 % (ref 11.5–15.5)
WBC: 5.4 10*3/uL (ref 4.0–10.5)
nRBC: 0 % (ref 0.0–0.2)

## 2020-05-29 LAB — LIPASE, BLOOD: Lipase: 1272 U/L — ABNORMAL HIGH (ref 11–51)

## 2020-05-29 LAB — LACTIC ACID, PLASMA: Lactic Acid, Venous: 1.3 mmol/L (ref 0.5–1.9)

## 2020-05-29 LAB — APTT: aPTT: 40 seconds — ABNORMAL HIGH (ref 24–36)

## 2020-05-29 LAB — SARS CORONAVIRUS 2 BY RT PCR (HOSPITAL ORDER, PERFORMED IN ~~LOC~~ HOSPITAL LAB): SARS Coronavirus 2: POSITIVE — AB

## 2020-05-29 MED ORDER — DULOXETINE HCL 30 MG PO CPEP
30.0000 mg | ORAL_CAPSULE | Freq: Every day | ORAL | Status: DC
  Administered 2020-05-30 – 2020-05-31 (×2): 30 mg via ORAL
  Filled 2020-05-29 (×2): qty 1

## 2020-05-29 MED ORDER — SODIUM CHLORIDE 0.9% FLUSH
3.0000 mL | Freq: Two times a day (BID) | INTRAVENOUS | Status: DC
  Administered 2020-05-30 – 2020-05-31 (×5): 3 mL via INTRAVENOUS

## 2020-05-29 MED ORDER — AZATHIOPRINE 50 MG PO TABS: 75.0000 mg | ORAL_TABLET | Freq: Every day | ORAL | Status: DC

## 2020-05-29 MED ORDER — MIRTAZAPINE 15 MG PO TABS
15.0000 mg | ORAL_TABLET | Freq: Every day | ORAL | Status: DC
  Administered 2020-05-29 – 2020-05-31 (×3): 15 mg via ORAL
  Filled 2020-05-29 (×3): qty 1

## 2020-05-29 MED ORDER — LEVETIRACETAM 500 MG PO TABS
1000.0000 mg | ORAL_TABLET | Freq: Every day | ORAL | Status: DC
  Administered 2020-05-29 – 2020-05-31 (×3): 1000 mg via ORAL
  Filled 2020-05-29 (×3): qty 2

## 2020-05-29 MED ORDER — SODIUM CHLORIDE 0.9% FLUSH: 3.0000 mL | INTRAVENOUS | Status: DC | PRN

## 2020-05-29 MED ORDER — CYCLOSPORINE MODIFIED (NEORAL) 25 MG PO CAPS: 75.0000 mg | ORAL_CAPSULE | Freq: Two times a day (BID) | ORAL | Status: DC

## 2020-05-29 MED ORDER — ONDANSETRON HCL 4 MG PO TABS: 4.0000 mg | ORAL_TABLET | Freq: Four times a day (QID) | ORAL | Status: DC | PRN

## 2020-05-29 MED ORDER — SODIUM CHLORIDE 0.9 % IV SOLN: 250.0000 mL | INTRAVENOUS | Status: DC | PRN

## 2020-05-29 MED ORDER — LACTATED RINGERS IV BOLUS
1000.0000 mL | Freq: Once | INTRAVENOUS | Status: AC
  Administered 2020-05-29: 1000 mL via INTRAVENOUS

## 2020-05-29 MED ORDER — PANCRELIPASE (LIP-PROT-AMYL) 12000-38000 UNITS PO CPEP
24000.0000 [IU] | ORAL_CAPSULE | Freq: Three times a day (TID) | ORAL | Status: DC
  Administered 2020-05-29 – 2020-05-30 (×2): 24000 [IU] via ORAL
  Filled 2020-05-29 (×10): qty 2

## 2020-05-29 MED ORDER — ONDANSETRON HCL 4 MG/2ML IJ SOLN: 4.0000 mg | Freq: Four times a day (QID) | INTRAMUSCULAR | Status: DC | PRN

## 2020-05-29 MED ORDER — DEXAMETHASONE SODIUM PHOSPHATE 10 MG/ML IJ SOLN: 6.0000 mg | INTRAMUSCULAR | Status: DC

## 2020-05-29 MED ORDER — IOHEXOL 300 MG/ML  SOLN
100.0000 mL | Freq: Once | INTRAMUSCULAR | Status: AC | PRN
  Administered 2020-05-29: 100 mL via INTRAVENOUS

## 2020-05-29 MED ORDER — VITAMIN D3 1.25 MG (50000 UT) PO CAPS: 50000.0000 [IU] | ORAL_CAPSULE | ORAL | Status: DC

## 2020-05-29 MED ORDER — LAMOTRIGINE 100 MG PO TABS
100.0000 mg | ORAL_TABLET | Freq: Every day | ORAL | Status: DC
  Administered 2020-05-30 – 2020-05-31 (×2): 100 mg via ORAL
  Filled 2020-05-29 (×2): qty 1

## 2020-05-29 MED ORDER — ASCORBIC ACID 500 MG PO TABS
500.0000 mg | ORAL_TABLET | Freq: Every day | ORAL | Status: DC
  Administered 2020-05-29 – 2020-05-31 (×3): 500 mg via ORAL
  Filled 2020-05-29 (×3): qty 1

## 2020-05-29 MED ORDER — VANCOMYCIN HCL IN DEXTROSE 1-5 GM/200ML-% IV SOLN
1000.0000 mg | Freq: Once | INTRAVENOUS | Status: AC
  Administered 2020-05-29: 1000 mg via INTRAVENOUS
  Filled 2020-05-29: qty 200

## 2020-05-29 MED ORDER — ACETAMINOPHEN 10 MG/ML IV SOLN
1000.0000 mg | Freq: Four times a day (QID) | INTRAVENOUS | Status: AC
  Administered 2020-05-29 – 2020-05-30 (×3): 1000 mg via INTRAVENOUS
  Filled 2020-05-29 (×4): qty 100

## 2020-05-29 MED ORDER — CINACALCET HCL 30 MG PO TABS
30.0000 mg | ORAL_TABLET | Freq: Every day | ORAL | Status: DC
  Administered 2020-05-30: 30 mg via ORAL
  Filled 2020-05-29 (×4): qty 1

## 2020-05-29 MED ORDER — SODIUM CHLORIDE 0.9 % IV SOLN: 200.0000 mg | Freq: Once | INTRAVENOUS | Status: DC

## 2020-05-29 MED ORDER — SODIUM CHLORIDE 0.9 % IV SOLN
100.0000 mg | Freq: Every day | INTRAVENOUS | Status: DC
  Administered 2020-05-30 – 2020-05-31 (×2): 100 mg via INTRAVENOUS
  Filled 2020-05-29 (×3): qty 20

## 2020-05-29 MED ORDER — ZINC SULFATE 220 (50 ZN) MG PO CAPS
220.0000 mg | ORAL_CAPSULE | Freq: Every day | ORAL | Status: DC
  Administered 2020-05-29 – 2020-05-31 (×3): 220 mg via ORAL
  Filled 2020-05-29 (×3): qty 1

## 2020-05-29 MED ORDER — KETAMINE HCL 10 MG/ML IJ SOLN
0.3000 mg/kg | Freq: Once | INTRAMUSCULAR | Status: AC
  Administered 2020-05-29: 18 mg via INTRAVENOUS
  Filled 2020-05-29: qty 1

## 2020-05-29 MED ORDER — GABAPENTIN 300 MG PO CAPS
300.0000 mg | ORAL_CAPSULE | Freq: Two times a day (BID) | ORAL | Status: DC
  Administered 2020-05-30 (×2): 300 mg via ORAL
  Filled 2020-05-29 (×2): qty 1

## 2020-05-29 MED ORDER — SEVELAMER CARBONATE 800 MG PO TABS
1600.0000 mg | ORAL_TABLET | Freq: Three times a day (TID) | ORAL | Status: DC
  Administered 2020-05-29 – 2020-05-30 (×2): 1600 mg via ORAL
  Filled 2020-05-29 (×10): qty 2

## 2020-05-29 MED ORDER — VITAMIN B-12 1000 MCG PO TABS
1000.0000 ug | ORAL_TABLET | Freq: Every day | ORAL | Status: DC
  Administered 2020-05-30 – 2020-05-31 (×2): 1000 ug via ORAL
  Filled 2020-05-29 (×2): qty 1

## 2020-05-29 MED ORDER — METRONIDAZOLE IN NACL 5-0.79 MG/ML-% IV SOLN
500.0000 mg | Freq: Once | INTRAVENOUS | Status: AC
  Administered 2020-05-29: 500 mg via INTRAVENOUS
  Filled 2020-05-29: qty 100

## 2020-05-29 MED ORDER — SODIUM CHLORIDE 0.9 % IV SOLN
2.0000 g | Freq: Once | INTRAVENOUS | Status: AC
  Administered 2020-05-29: 2 g via INTRAVENOUS
  Filled 2020-05-29: qty 2

## 2020-05-29 MED ORDER — SODIUM CHLORIDE 0.9 % IV SOLN: 100.0000 mg | Freq: Every day | INTRAVENOUS | Status: DC

## 2020-05-29 MED ORDER — DEXAMETHASONE SODIUM PHOSPHATE 10 MG/ML IJ SOLN
10.0000 mg | Freq: Once | INTRAMUSCULAR | Status: AC
  Administered 2020-05-29: 10 mg via INTRAVENOUS
  Filled 2020-05-29: qty 1

## 2020-05-29 MED ORDER — ALBUTEROL SULFATE HFA 108 (90 BASE) MCG/ACT IN AERS
2.0000 | INHALATION_SPRAY | RESPIRATORY_TRACT | Status: DC | PRN
  Filled 2020-05-29: qty 6.7

## 2020-05-29 MED ORDER — LACTATED RINGERS IV BOLUS (SEPSIS)
1000.0000 mL | Freq: Once | INTRAVENOUS | Status: AC
  Administered 2020-05-29: 1000 mL via INTRAVENOUS

## 2020-05-29 MED ORDER — LACOSAMIDE 50 MG PO TABS
100.0000 mg | ORAL_TABLET | Freq: Two times a day (BID) | ORAL | Status: DC
  Administered 2020-05-29 – 2020-05-31 (×5): 100 mg via ORAL
  Filled 2020-05-29 (×5): qty 2

## 2020-05-29 MED ORDER — SODIUM CHLORIDE 0.9 % IV SOLN
200.0000 mg | Freq: Once | INTRAVENOUS | Status: AC
  Administered 2020-05-29: 200 mg via INTRAVENOUS
  Filled 2020-05-29: qty 40

## 2020-05-29 MED ORDER — TAMSULOSIN HCL 0.4 MG PO CAPS
0.4000 mg | ORAL_CAPSULE | Freq: Every day | ORAL | Status: DC
  Administered 2020-05-30 – 2020-05-31 (×2): 0.4 mg via ORAL
  Filled 2020-05-29 (×2): qty 1

## 2020-05-29 MED ORDER — APIXABAN 2.5 MG PO TABS
2.5000 mg | ORAL_TABLET | Freq: Two times a day (BID) | ORAL | Status: DC
  Administered 2020-05-29 – 2020-05-31 (×5): 2.5 mg via ORAL
  Filled 2020-05-29 (×6): qty 1

## 2020-05-29 MED ORDER — ACETAMINOPHEN 500 MG PO TABS: 500.0000 mg | ORAL_TABLET | Freq: Four times a day (QID) | ORAL | Status: DC | PRN

## 2020-05-29 NOTE — ED Triage Notes (Signed)
Pt presents to ED via [facility] with c/o hypotension, per [facility] pt does home dialysis 5 times/day has been having diarrhea x 2 days. Per [facility] initial BP 50/30, placed patient in trendelenburg, BP 70/39 after trendelenburg, per EMS unable to obtain IV access.   EMS reports that patient wants to be transferred to [facility], pt reports last PD approx 2 hrs ago. On assessment abdominal pain and distention noted with palpation.   Pt also c/o fever at home, Tmax at home 100.1. Pt appears pale, lethargic. Pt placed in trendelenburg by this RN upon BP reading.

## 2020-05-29 NOTE — ED Notes (Signed)
This RN in bathroom for call light response. Pt assisted back to bed and placed back on monitor. Bright red blood from rectum in toilet. MD made aware.

## 2020-05-29 NOTE — ED Notes (Signed)
Pt transported to CT at this time.

## 2020-05-29 NOTE — ED Notes (Signed)
This RN to bedside at this time, pt resting in bed with eyes closed. Pt repeatedly calling out for pain medication despite this RN explaining to patient that this RN with critical patient and no pain medication ordered at this time. Pt states he did not like the way the ketamine made him feel. This RN explained to patient repeatedly that this RN would message admitting MD at first opportunity. Upon this RN arrival to bedside, pt resting in bed with eyes closed, lights remain dimmed for patient comfort.

## 2020-05-29 NOTE — H&P (Addendum)
History and Physical    John Cline. WRU:[id number] DOB: 06/19/1986 DOA: 05/29/2020  PCP: Servando Snare, MD   Patient coming from: Home  I have personally briefly reviewed patient's old medical records in [place]  Chief Complaint: Diarrhea                                Weakness  HPI: John Brothers. is a 34 y.o. male with medical history significant forhistory of failed kidney transplant, end-stage renal disease on peritoneal dialysis, chronic pancreatitis, atrial fibrillation on Eliquis, history of CVA, history of seizures and history of substance use and concern for opioid use disorder. Patient presents to the emergency room via EMS after his friend found him lethargic on the floor at home. Patient thinks he may have passed out. Patient states that he has had diarrhea for 3 days, cough, nausea and poor oral intake. Per EMS in the field he had a BP of 50/30, they were unable to get IV access and placed him in [place] position with improvement in his blood pressure to 70/39. He received 2L fluid bolus in the ER with improvement in his BP to 91YNWG systolic and MAP of 67. Pt with complaints of fever at home with T max of  100.62F, nausea and a non productive cough. Pt appears pale, lethargic. Patient's COVID 19 PCR test is positive. Patient sstates that he is  vaccinated and has received his booster shot. He also complains of diffuse abdominal pain and has a history of chronic pancreatitis. Labs show sosdium of 126, potassium of 3.8, Hco3 23, BUN 91, Creatinine 13.62, AST 21, ALT 15, Lactic acid 1.3, WBC 5.4, Hb 10.1, HCT 29, Platelets 176.  CT scan of abdomen and pelvis shows  Mild bilateral patchy airspace opacities at both lung bases which could be due to atelectasis and/or early infectious etiology. Small abdominopelvic ascites. The bladder is partially decompressed with apparent thickening which could be due to cystitis versus physiologic  under Distension. Mild edematous appearance to the splenic flexure and small bowel loops. Findings may be infectious, inflammatory, or secondary to Hypoalbuminemia. Chest x ray reviewed by me show+s mild patchy peripheral infiltrates bilaterally. Probable pneumonia. Correlate with COVID-19 status. 12 Lead EKG reviewed by me shows atrial fibrillation   ED Course: Patient is a 34 year old patient with a history of ERSD on PD who was brought into the ER after he was found at home lethargic and hypotensive. Patient has had diarrhea for 3 days as well as fever and his COVID 19 PCR test is posistive. He received 2L fluid bolus in the ER with improvement in his blood pressusre.  He will be admitted to the hospital for further evaluation.  Review of Systems: As per HPI otherwise 10 point review of systems negative.    Past Medical History:  Diagnosis Date  . Atrial fibrillation ([place])   . Hypertension   . Seizures ([place])     Past Surgical History:  Procedure Laterality Date  . KIDNEY TRANSPLANT       reports that he has never smoked. He has never used smokeless tobacco. He reports previous alcohol use. No history on file for drug use.  Allergies  Allergen Reactions  . Dapsone Other (See Comments)    Methemoglobinemia    Other reaction(s): Other (See Comments) Childhood allergies methemoglobinemia Other reaction(s): Unknown   . Morphine Hives and Rash  Per patient, this is just redness and itching at injection site. Confusion Other reaction(s): Unknown   . Lipase Other (See Comments)    Constipation  Other reaction(s): Other (See Comments) Constipation Other reaction(s): Unknown  . Morphine And Related Rash    History reviewed. No pertinent family history.   Prior to Admission medications   Medication Sig Start Date End Date Taking? Authorizing Provider  acetaminophen (TYLENOL) 500 MG tablet Take 500-1,000 mg by mouth every 6 (six) hours as needed for mild pain or  headache.     [provider]  amLODipine (NORVASC) 10 MG tablet Take 10 mg by mouth daily.     [provider]  apixaban (ELIQUIS) 2.5 MG TABS tablet Take 2.5 mg by mouth 2 (two) times daily.    [provider]  azaTHIOprine (IMURAN) 50 MG tablet Take 75 mg by mouth daily.     [provider]  carvedilol (COREG) 25 MG tablet Take 25 mg by mouth 2 (two) times daily with a meal.     [provider]  Cholecalciferol (VITAMIN D3) 1.25 MG (50000 UT) CAPS Take 50,000 Units by mouth every 30 (thirty) days.     [provider]  cinacalcet (SENSIPAR) 30 MG tablet Take 1 tablet (30 mg total) by mouth daily. 01/04/20   Fritzi Mandes, MD  cloNIDine (CATAPRES) 0.2 MG tablet Take 0.2 mg by mouth 3 (three) times daily.    [provider]  cycloSPORINE modified (NEORAL) 25 MG capsule Take 75-100 mg by mouth 2 (two) times daily. 100MG -AM 75MG -PM    [provider]  diltiazem (CARDIZEM CD) 120 MG 24 hr capsule Take 120 mg by mouth daily.    [provider]  DULoxetine (CYMBALTA) 30 MG capsule Take 30 mg by mouth daily.    [provider]  gabapentin (NEURONTIN) 300 MG capsule Take 300 mg by mouth 2 (two) times daily. 05/03/18   [provider]  lacosamide 100 MG TABS Take 1 tablet (100 mg total) by mouth 2 (two) times daily. 03/31/19   Al Decant, MD  lamoTRIgine (LAMICTAL) 100 MG tablet Take 100 mg by mouth daily.    [provider]  levETIRAcetam (KEPPRA) 1000 MG tablet Take 1,000 mg by mouth at bedtime.    [provider]  lipase/protease/amylase 24000-76000 units CPEP Take 1 capsule (24,000 Units total) by mouth 3 (three) times daily as needed (with meals). 01/04/20   Fritzi Mandes, MD  mirtazapine (REMERON) 15 MG tablet Take by mouth. Take 0.5 tablets (7.5mg ) daily at bedtime 05/26/18   [provider]  ondansetron (ZOFRAN) 4 MG tablet Take 4 mg by mouth every 8 (eight) hours as needed for  nausea or vomiting.     [provider]  predniSONE (DELTASONE) 10 MG tablet Take 10 mg by mouth daily. 05/19/18   [provider]  PROAIR HFA 108 (90 Base) MCG/ACT inhaler Inhale 2 puffs into the lungs every 4 (four) hours as needed for shortness of breath or wheezing. 12/25/19   [provider]  sevelamer carbonate (RENVELA) 800 MG tablet Take 1,600 mg by mouth 3 (three) times daily with meals.     [provider]  tamsulosin (FLOMAX) 0.4 MG CAPS capsule Take 0.4 mg by mouth daily.    [provider]  torsemide (DEMADEX) 20 MG tablet Take 20 mg by mouth daily.    [provider]  vitamin B-12 (CYANOCOBALAMIN) 1000 MCG tablet Take 1,000 mcg by mouth daily.  [provider]    Physical Exam: Vitals:   05/29/20 1318 05/29/20 1330 05/29/20 1615 05/29/20 1630  BP:  (!) 73/50 103/85 94/70  Pulse:  73 86 83  Resp:  18 (!) 21 16  Temp:      TempSrc:      SpO2:  98% 95% 94%  Weight: 59 kg     Height: 5\' 11"  (1.803 m)        Vitals:   05/29/20 1318 05/29/20 1330 05/29/20 1615 05/29/20 1630  BP:  (!) 73/50 103/85 94/70  Pulse:  73 86 83  Resp:  18 (!) 21 16  Temp:      TempSrc:      SpO2:  98% 95% 94%  Weight: 59 kg     Height: 5\' 11"  (1.803 m)       Constitutional: NAD, lethargic Eyes: PERRL, lids and conjunctivae pale  ENMT: Mucous membranes are dry.  Neck: normal, supple, no masses, no thyromegaly Respiratory: BLAE, no wheezing, no crackles. Normal respiratory effort. No accessory muscle use.  Cardiovascular: Regular rate and rhythm, no murmurs / rubs / gallops. No extremity edema. 2+ pedal pulses. No carotid bruits.  Abdomen: Diffuse tenderness, no masses palpated. No hepatosplenomegaly. Bowel sounds positive.  Musculoskeletal: no clubbing / cyanosis. No joint deformity upper and lower extremities.  Skin: no rashes, lesions, ulcers.  Neurologic: Lethargic. Unable to assess  Psychiatric: Normal mood and  affect.   Labs on Admission: I have personally reviewed following labs and imaging studies  CBC: Recent Labs  Lab 05/29/20 1305  WBC 5.4  NEUTROABS 4.1  HGB 10.1*  HCT 29.0*  MCV 92.9  PLT 588   Basic Metabolic Panel: Recent Labs  Lab 05/29/20 1305  NA 126*  K 3.8  CL 86*  CO2 23  GLUCOSE 99  BUN 91*  CREATININE 13.62*  CALCIUM 8.0*   GFR: Estimated Creatinine Clearance: 6.3 mL/min (A) (by C-G formula based on SCr of 13.62 mg/dL (H)). Liver Function Tests: Recent Labs  Lab 05/29/20 1305  AST 21  ALT 15  ALKPHOS 117  BILITOT 0.8  PROT 6.3*  ALBUMIN 3.1*   No results for input(s): LIPASE, AMYLASE in the last 168 hours. No results for input(s): AMMONIA in the last 168 hours. Coagulation Profile: Recent Labs  Lab 05/29/20 1305  INR 1.2   Cardiac Enzymes: No results for input(s): CKTOTAL, CKMB, CKMBINDEX, TROPONINI in the last 168 hours. BNP (last 3 results) No results for input(s): PROBNP in the last 8760 hours. HbA1C: No results for input(s): HGBA1C in the last 72 hours. CBG: No results for input(s): GLUCAP in the last 168 hours. Lipid Profile: No results for input(s): CHOL, HDL, LDLCALC, TRIG, CHOLHDL, LDLDIRECT in the last 72 hours. Thyroid Function Tests: No results for input(s): TSH, T4TOTAL, FREET4, T3FREE, THYROIDAB in the last 72 hours. Anemia Panel: No results for input(s): VITAMINB12, FOLATE, FERRITIN, TIBC, IRON, RETICCTPCT in the last 72 hours. Urine analysis:    Component Value Date/Time   COLORURINE YELLOW (A) 12/30/2019 0552   APPEARANCEUR CLEAR (A) 12/30/2019 0552   APPEARANCEUR Clear 05/25/2014 1509   LABSPEC 1.016 12/30/2019 0552   LABSPEC 1.005 05/25/2014 1509   PHURINE 6.0 12/30/2019 [place] 12/30/2019 0552   GLUCOSEU Negative 05/25/2014 1509   HGBUR NEGATIVE 12/30/2019 [place] NEGATIVE 12/30/2019 0552   BILIRUBINUR Negative 05/25/2014 [place] 12/30/2019 0552   PROTEINUR 100  (A) 12/30/2019 0552   NITRITE NEGATIVE 12/30/2019  [place] 12/30/2019 0552   LEUKOCYTESUR Negative 05/25/2014 1509    Radiological Exams on Admission: CT ABDOMEN PELVIS W CONTRAST  Result Date: 05/29/2020 CLINICAL DATA:  Abdominal pain and fever EXAM: CT ABDOMEN AND PELVIS WITH CONTRAST TECHNIQUE: Multidetector CT imaging of the abdomen and pelvis was performed using the standard protocol following bolus administration of intravenous contrast. CONTRAST:  139mL OMNIPAQUE IOHEXOL 300 MG/ML  SOLN COMPARISON:  December 30, 2019 FINDINGS: Lower chest: The visualized heart size within normal limits. No pericardial fluid/thickening. No hiatal hernia. Subpleural patchy airspace opacity seen at lung bases. Hepatobiliary: The liver is normal in density without focal abnormality.The main portal vein is patent. No evidence of calcified gallstones, gallbladder wall thickening or biliary dilatation. Pancreas: Unremarkable. No pancreatic ductal dilatation or surrounding inflammatory changes. Spleen: Normal in size without focal abnormality. Adrenals/Urinary Tract: Both adrenal glands appear normal. The patient is status post bilateral nephrectomy. A left lower quadrant renal transplant is present. Mild to moderate pelvicaliectasis ureterectasis noted with mild urothelial hyperenhancement. The bladder is partially decompressed with mild diffuse bladder wall thickening. Stomach/Bowel: The stomach is normal in appearance. There is question of mesenteric edema and clumping of small bowel in the splenic flexure within the left upper quadrant with bowel wall edema. Vascular/Lymphatic: There are no enlarged mesenteric, retroperitoneal, or pelvic lymph nodes. Scattered atherosclerosis at the origin the renal arteries right iliac vasculature. Reproductive: The prostate is unremarkable. Other: A peritoneal dialysis catheter coiled within the deep pelvis. There is a small amount of abdominal ascites.  Musculoskeletal: No acute or significant osseous findings. IMPRESSION: Mild bilateral patchy airspace opacities at both lung bases which could be due to atelectasis and/or early infectious etiology. Small abdominopelvic ascites. The bladder is partially decompressed with apparent thickening which could be due to cystitis versus physiologic under distension. Mild edematous appearance to the splenic flexure and small bowel loops. Findings may be infectious, inflammatory, or secondary to hypoalbuminemia. Electronically Signed   By: Prudencio Pair M.D.   On: 05/29/2020 15:31   DG Chest Port 1 View  Result Date: 05/29/2020 CLINICAL DATA:  Question sepsis.  Dialysis patient. EXAM: PORTABLE CHEST 1 VIEW COMPARISON:  01/01/2020 FINDINGS: Mild patchy peripheral infiltrates have developed since the prior study. Heart size within normal limits. Vascularity normal. No pleural effusion. IMPRESSION: Mild patchy peripheral infiltrates bilaterally. Probable pneumonia. Correlate with COVID-19 status. Electronically Signed   By: Franchot Gallo M.D.   On: 05/29/2020 13:52    EKG: Independently reviewed.   Atrial fibrillation   Assessment/Plan Principal Problem:   Gastroenteritis due to COVID-19 virus Active Problems:   Hyponatremia   Anemia in chronic renal disease   Chronic abdominal pain   Depressive disorder   Epilepsy ([place])   ESRD (end stage renal disease) ([place])   Kidney transplant failure   Atrial fibrillation ([place])   Peritoneal dialysis status ([place])   COVID-19 virus infection   Hypotension due to hypovolemia      COVID 19 Gastroenteritis Patient presents for evaluation after he was found lethargic at home and was hypotenisve. Patient has had diarrhea for 3 days associated with fever.  His COVID 19 PCR test is positive He has CXR findings suggestive of pneumonia but is not hypoxic Will start patient on Remdesivir per protocol Supportive care with imodium if patient rules out for Clostridium  difficile diarrhea    Hypotensison Secondary to GI loss  from diarrhea Patient had a SBP of 7mmHg which improved following IVF hydration Hold all anti hypertensisve  medications for now    Abdominal pain Most likely secondary to  chronic pancreatitis R/O peritonitis  Will send peritoneal fluid for analysis Explained to patient in detail that he will not be able to get opioids for pain control due to hypotension. He verbalized understanding   End-stage renal disease on peritoneal dialysis We will request nephrology consult    History of atrial fibrillation/atrial flutter Hold Cardizem and Coreg due to hypotensison Continue Eliquis as primary prophylaxis for an acute stroke   Anemia of chronic disease H/H is stable    S/p Failedrenaltransplant Continueimmunosuppressive therapy Continue cyclosporine, azathioprine and prednisone   History of seizures Continue Keppra  Seizure precautions    Depression Continue Remeron    Hyponatremia Most likely secondary to poor oral intake Will monitor closely     DVT prophylaxis: Eliquis Code Status: Full code Family Communication: Greater than 50% of time was spent discussisng plan of care with patient at the bedside. Disposition Plan: Back to previous home environment Consults called: Nephrology    Collier Bullock MD [facility]     05/29/2020, 5:20 PM

## 2020-05-29 NOTE — Progress Notes (Signed)
CODE SEPSIS - PHARMACY COMMUNICATION  **Broad Spectrum Antibiotics should be administered within 1 hour of Sepsis diagnosis**  Time Code Sepsis Called/Page Received: 0915 1307  Antibiotics Ordered: Cefepime and Vancomycin  Time of 1st antibiotic administration: 1336  Additional action taken by pharmacy:    If necessary, Name of Provider/Nurse Contacted:      Noralee Space ,PharmD Clinical Pharmacist  05/29/2020  1:48 PM

## 2020-05-29 NOTE — ED Notes (Signed)
1 set blood cultures obtained prior to ABX initiation due to patient being very difficult stick. EDP aware at this time.

## 2020-05-29 NOTE — ED Notes (Signed)
Admitting MD messaged regarding patient's request for pain medication. Awaiting orders at this time.

## 2020-05-29 NOTE — Consult Note (Signed)
Remdesivir - Pharmacy Brief Note   O:  ALT: 15 CXR: Mild patchy peripheral infiltrates bilaterally. Probable pneumonia. Correlate with COVID-19 status. SpO2: 92% on room air   A/P:  Remdesivir 200 mg IVPB once followed by 100 mg IVPB daily x 4 days.   Benn Moulder, PharmD Pharmacy Resident  05/29/2020 3:02 PM

## 2020-05-29 NOTE — ED Provider Notes (Addendum)
[facility] Emergency Department Provider Note  ____________________________________________  Time seen: Approximately 1:29 PM  I have reviewed the triage vital signs and the nursing notes.   HISTORY  Chief Complaint Hypotension and Diarrhea    HPI John Lank. is a 34 y.o. male with a history of atrial fibrillation, hypertension, end-stage renal disease on peritoneal dialysis who comes the ED complaining of weakness and diarrhea for the past 2 days, has continued doing his dialysis at home.  Patient also reports having a fever yesterday 100.1.  He does report a history of peritonitis.  No preceding illness, no chest pain or shortness of breath, no body aches.  Complains of generalized abdominal pain that is nonradiating and severe.  EMS report initial blood pressure 50/30, unable to obtain IV access.      Past Medical History:  Diagnosis Date  . Atrial fibrillation ([place])   . Hypertension   . Seizures [facility])      Patient Active Problem List   Diagnosis Date Noted  . Peritoneal dialysis status ([place])   . Acute on chronic pancreatitis ([place]) 01/01/2020  . Chronic pain 12/30/2019  . Gastritis and duodenitis 12/30/2019  . History of pancreatitis 12/30/2019  . Left flank pain 12/30/2019  . Paroxysmal atrial fibrillation ([place]) 12/30/2019  . Hypokalemia 12/30/2019  . Fall 12/26/2019  . Hypotension 12/26/2019  . Hyponatremia 03/26/2019  . Weakness 03/14/2019  . Volume overload 02/09/2019  . Meibomian gland dysfunction (MGD) of both eyes 11/04/2018  . Allergic conjunctivitis of both eyes 08/03/2018  . Myopia of both eyes 08/03/2018  . 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 Generalized edema 06/07/2018  . Edema 01/20/2018  . Pneumonia 01/05/2018  . Cluster B personality disorder ([place]) 12/14/2017  . 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 Clostridium difficile diarrhea 10/26/2017  . Acute kidney injury ([place]) 07/03/2017  . Elevated lipase 04/11/2017  .  Decreased urine output 05/13/2016  . Hyperkalemia 03/07/2016  . Malaise and fatigue 03/07/2016  . Chronic tension-type headache, intractable 11/29/2015  . Partial symptomatic epilepsy with complex partial seizures, intractable, without status epilepticus ([place]) 11/28/2015  . Seizure disorder ([place]) 07/30/2015  . Erectile dysfunction 07/19/2014  . History of epididymitis 07/19/2014  . Hyperparathyroidism ([place]) 07/19/2014  . Low bone density for age 76/01/2014  . Chronic diarrhea 07/19/2014  . Pancreatitis, recurrent 07/19/2014  . Vitamin D deficiency 07/19/2014  . Chronic abdominal pain 05/22/2014  . Memory deficit 04/23/2014  . Nausea & vomiting 02/28/2014  . Scrotal pain 02/08/2014  . Diarrhea 12/04/2013  . Left arm pain 07/15/2013  . Epilepsy ([place]) 06/09/2013  . Anemia in chronic renal disease 05/31/2013  . Aftercare following organ transplant 05/11/2012  . Chronic groin pain 03/10/2012  . Neuropathic pain 03/10/2012  . Renal failure 03/10/2012  . History of kidney transplant 02/23/2012  . Essential hypertension 09/02/2011  . Sleep disturbance 05/11/2011  . 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 Bilateral hydrocele 01/12/2009  . Kidney transplant failure 01/08/2009  . Depressive disorder 04/29/2008  . Atrial fibrillation ([place]) 11/30/2007  . Abdominal pain 02/07/2005  . Immunosuppression ([place]) 10/02/1998  . ESRD (end stage renal disease) ([place]) 03/14/1998     Past Surgical History:  Procedure Laterality Date  . KIDNEY TRANSPLANT       Prior to Admission medications   Medication Sig Start Date End Date Taking? Authorizing Provider  acetaminophen (TYLENOL) 500 MG tablet Take 500-1,000 mg by mouth every 6 (six) hours as needed for mild pain or headache.  [provider]  amLODipine (NORVASC) 10 MG tablet Take 10 mg by mouth daily.     [provider]  apixaban (ELIQUIS) 2.5 MG TABS tablet Take 2.5 mg by mouth 2 (two) times daily.    [provider]  azaTHIOprine (IMURAN) 50 MG tablet Take 75 mg by mouth daily.     [provider]  carvedilol (COREG) 25 MG tablet Take 25 mg by mouth 2 (two) times daily with a meal.     [provider]  Cholecalciferol (VITAMIN D3) 1.25 MG (50000 UT) CAPS Take 50,000 Units by mouth every 30 (thirty) days.     [provider]  cinacalcet (SENSIPAR) 30 MG tablet Take 1 tablet (30 mg total) by mouth daily. 01/04/20   Fritzi Mandes, MD  cloNIDine (CATAPRES) 0.2 MG tablet Take 0.2 mg by mouth 3 (three) times daily.    [provider]  cycloSPORINE modified (NEORAL) 25 MG capsule Take 75-100 mg by mouth 2 (two) times daily. 100MG -AM 75MG -PM    [provider]  diltiazem (CARDIZEM CD) 120 MG 24 hr capsule Take 120 mg by mouth daily.    [provider]  DULoxetine (CYMBALTA) 30 MG capsule Take 30 mg by mouth daily.    [provider]  gabapentin (NEURONTIN) 300 MG capsule Take 300 mg by mouth 2 (two) times daily. 05/03/18   [provider]  lacosamide 100 MG TABS Take 1 tablet (100 mg total) by mouth 2 (two) times daily. 03/31/19   Al Decant, MD  lamoTRIgine (LAMICTAL) 100 MG tablet Take 100 mg by mouth daily.    [provider]  levETIRAcetam (KEPPRA) 1000 MG tablet Take 1,000 mg by mouth at bedtime.    [provider]  lipase/protease/amylase 24000-76000 units CPEP Take 1 capsule (24,000 Units total) by mouth 3 (three) times daily as needed (with meals). 01/04/20   Fritzi Mandes, MD  mirtazapine (REMERON) 15 MG tablet Take by mouth. Take 0.5 tablets (7.5mg ) daily at bedtime 05/26/18   [provider]  ondansetron (ZOFRAN) 4 MG tablet Take 4 mg by mouth every 8 (eight) hours as needed for nausea or vomiting.     [provider]  predniSONE (DELTASONE) 10 MG tablet Take 10 mg by mouth daily. 05/19/18   [provider]  PROAIR HFA 108 (90 Base) MCG/ACT inhaler Inhale 2 puffs into the lungs every 4 (four)  hours as needed for shortness of breath or wheezing. 12/25/19   [provider]  sevelamer carbonate (RENVELA) 800 MG tablet Take 1,600 mg by mouth 3 (three) times daily with meals.     [provider]  tamsulosin (FLOMAX) 0.4 MG CAPS capsule Take 0.4 mg by mouth daily.    [provider]  torsemide (DEMADEX) 20 MG tablet Take 20 mg by mouth daily.    [provider]  vitamin B-12 (CYANOCOBALAMIN) 1000 MCG tablet Take 1,000 mcg by mouth daily.    [provider]     Allergies Dapsone, Morphine, Lipase, and Morphine and related   History reviewed. No pertinent family history.  Social History Social History   Tobacco Use  . Smoking status: Never Smoker  . Smokeless tobacco: Never Used  Vaping Use  . Vaping Use: Never used  Substance Use Topics  . Alcohol use: Not Currently  . Drug use: Not on file    Review of Systems  Constitutional:   Positive fever ENT:   No sore throat. No rhinorrhea. Cardiovascular:   No  chest pain or syncope. Respiratory:   No dyspnea or cough. Gastrointestinal: Positive abdominal pain and diarrhea Musculoskeletal:   Negative for focal pain or swelling All other systems reviewed and are negative except as documented above in ROS and HPI.  ____________________________________________   PHYSICAL EXAM:  VITAL SIGNS: ED Triage Vitals  Enc Vitals Group     BP 05/29/20 1317 (!) 61/53     Pulse Rate 05/29/20 1317 84     Resp 05/29/20 1317 20     Temp 05/29/20 1317 99.1 F (37.3 C)     Temp Source 05/29/20 1317 Oral     SpO2 05/29/20 1317 99 %     Weight 05/29/20 1318 130 lb (59 kg)     Height 05/29/20 1318 5\' 11"  (1.803 m)     Head Circumference --      Peak Flow --      Pain Score 05/29/20 1318 9     Pain Loc --      Pain Edu? --      Excl. in [place]? --     Vital signs reviewed, nursing assessments reviewed.   Constitutional:   Alert and oriented.  Ill-appearing Eyes:   Conjunctivae are normal.  EOMI. PERRL. ENT      Head:   Normocephalic and atraumatic.      Nose:   Normal      Mouth/Throat:   Dry mucous membranes      Neck:   No meningismus. Full ROM. Hematological/Lymphatic/Immunilogical:   No cervical lymphadenopathy. Cardiovascular:   Regular rate heart rate 80s.  [place] pulses bilaterally. Cap refill less than 2 seconds. Respiratory:   Normal respiratory effort without tachypnea/retractions. Breath sounds are clear and equal bilaterally. No wheezes/rales/rhonchi. Gastrointestinal:   Soft with generalized tenderness and guarding. Non distended. No inflammatory changes at PD cath exit site. Musculoskeletal:   Normal range of motion in all extremities. No joint effusions.  No lower extremity tenderness.  No edema. Neurologic:   Normal speech and language.  Motor grossly intact. No acute focal neurologic deficits are appreciated.  Skin:    Skin is warm, dry and intact. No rash noted.  No petechiae, purpura, or bullae.  ____________________________________________    LABS (pertinent positives/negatives) (all labs ordered are listed, but only abnormal results are displayed) Labs Reviewed  SARS CORONAVIRUS 2 BY RT PCR (HOSPITAL ORDER, [facility]) - Abnormal; Notable for the following components:      Result Value   SARS Coronavirus 2 POSITIVE (*)    All other components within normal limits  COMPREHENSIVE METABOLIC PANEL - Abnormal; Notable for the following components:   Sodium 126 (*)    Chloride 86 (*)    BUN 91 (*)    Creatinine, Ser 13.62 (*)    Calcium 8.0 (*)    Total Protein 6.3 (*)    Albumin 3.1 (*)    GFR calc non Af Amer 4 (*)    GFR calc Af Amer 5 (*)    Anion gap 17 (*)    All other components within normal limits  CBC WITH DIFFERENTIAL/PLATELET - Abnormal; Notable for the following components:   RBC 3.12 (*)    Hemoglobin 10.1 (*)    HCT 29.0 (*)    All other components within normal limits  APTT - Abnormal; Notable for  the following components:   aPTT 40 (*)    All other components within normal limits  CULTURE, BLOOD (ROUTINE X 2)  CULTURE, BLOOD (  ROUTINE X 2)  URINE CULTURE  GASTROINTESTINAL PANEL BY PCR, STOOL (REPLACES STOOL CULTURE)  C DIFFICILE QUICK SCREEN W PCR REFLEX  LACTIC ACID, PLASMA  PROTIME-INR  URINALYSIS, COMPLETE (UACMP) WITH MICROSCOPIC   ____________________________________________   EKG  Interpreted by me Atrial fibrillation, rate of 82, normal axis and intervals.  Normal QRS ST segments and T waves.  No ischemic changes.  ____________________________________________    RADIOLOGY  DG Chest Port 1 View  Result Date: 05/29/2020 CLINICAL DATA:  Question sepsis.  Dialysis patient. EXAM: PORTABLE CHEST 1 VIEW COMPARISON:  01/01/2020 FINDINGS: Mild patchy peripheral infiltrates have developed since the prior study. Heart size within normal limits. Vascularity normal. No pleural effusion. IMPRESSION: Mild patchy peripheral infiltrates bilaterally. Probable pneumonia. Correlate with COVID-19 status. Electronically Signed   By: Franchot Gallo M.D.   On: 05/29/2020 13:52    ____________________________________________   PROCEDURES .Critical Care Performed by: Carrie Mew, MD Authorized by: Carrie Mew, MD   Critical care provider statement:    Critical care time (minutes):  35   Critical care time was exclusive of:  Separately billable procedures and treating other patients   Critical care was necessary to treat or prevent imminent or life-threatening deterioration of the following conditions:  Sepsis, shock and dehydration   Critical care was time spent personally by me on the following activities:  Development of treatment plan with patient or surrogate, discussions with consultants, evaluation of patient's response to treatment, examination of patient, obtaining history from patient or surrogate, ordering and performing treatments and interventions, ordering and  review of laboratory studies, ordering and review of radiographic studies, pulse oximetry, re-evaluation of patient's condition, review of old charts and blood draw for specimens Comments:        Angiocath insertion  Date/Time: 05/29/2020 1:33 PM Performed by: Carrie Mew, MD Authorized by: Carrie Mew, MD  Local anesthesia used: no  Anesthesia: Local anesthesia used: no  Sedation: Patient sedated: no  Patient tolerance: patient tolerated the procedure well with no immediate complications Comments: Continuous [place] visualization, 1 attempt, EBL 17ml, Left median cubital vein     ____________________________________________  DIFFERENTIAL DIAGNOSIS   SBP, UTI, pneumonia, septic shock, electrolyte abnormality, bowel perforation, intra-abdominal abscess  CLINICAL IMPRESSION / ASSESSMENT AND PLAN / ED COURSE  Medications ordered in the ED: Medications  metroNIDAZOLE (FLAGYL) IVPB 500 mg (500 mg Intravenous New Bag/Given 05/29/20 1413)  vancomycin (VANCOCIN) IVPB 1000 mg/200 mL premix (has no administration in time range)  ketamine (KETALAR) injection 18 mg (has no administration in time range)  dexamethasone (DECADRON) injection 10 mg (has no administration in time range)  lactated ringers bolus 1,000 mL (has no administration in time range)  remdesivir 200 mg in sodium chloride 0.9% 250 mL IVPB (has no administration in time range)    Followed by  remdesivir 100 mg in sodium chloride 0.9 % 100 mL IVPB (has no administration in time range)  ceFEPIme (MAXIPIME) 2 g in sodium chloride 0.9 % 100 mL IVPB (0 g Intravenous Stopped 05/29/20 1406)  lactated ringers bolus 1,000 mL (1,000 mLs Intravenous New Bag/Given 05/29/20 1313)    And  lactated ringers bolus 1,000 mL (1,000 mLs Intravenous New Bag/Given 05/29/20 1313)  iohexol (OMNIPAQUE) 300 MG/ML solution 100 mL (100 mLs Intravenous Contrast Given 05/29/20 1500)    Pertinent labs & imaging results that were available during  my care of the patient were reviewed by me and considered in my medical decision making (see chart for details).  John Dubin  Brooke Bonito. was evaluated in Emergency Department on 05/29/2020 for the symptoms described in the history of present illness. He was evaluated in the context of the global COVID-19 pandemic, which necessitated consideration that the patient might be at risk for infection with the SARS-CoV-2 virus that causes COVID-19. Institutional protocols and algorithms that pertain to the evaluation of patients at risk for COVID-19 are in a state of rapid change based on information released by regulatory bodies including the CDC and federal and state organizations. These policies and algorithms were followed during the patient's care in the ED.     Clinical Course as of May 29 1510  Wed May 29, 2020  1328 Patient presents with hypertension, reports a fever at home yesterday.  Peripheral IV placed by me to facilitate resuscitation and obtain lab specimens.  Will start sepsis protocol, cefepime Flagyl and vancomycin for antibiotics, 2 L lactated ringer bolus for initial resuscitation.  Will ask dialysis team to obtain a peritoneal sample, check chest and abdominal x-rays, Covid test, GI ID panel.   [PS]  1338 Only able to obtain 1 set of blood culture due to poor IV access. Will go ahead and start antibiotics due to concern for septic shock.    [PS]  1343 Bp improved to 82/57, hr 80 with IVF, continue infusions .   [PS]  6761 Chest x-ray concerning for atypical/multifocal pneumonia or COVID-19 infection.   [PS]  1415 Bp continues improving - now 90/60. MAP adequate. Sepsis reassessment completed. Will give lose dose ketamine for abd pain.    [PS]  1439 Covid PCR is positive.  Will order decadron and remdesivir.   [PS]  1445 2L bolus completed. MAP adequate, but BP still borderline, will continue IVF with 3rd liter bolus.    [PS]    Clinical Course User Index [PS] Carrie Mew, MD      ____________________________________________   FINAL CLINICAL IMPRESSION(S) / ED DIAGNOSES    Final diagnoses:  [id number] virus infection  ESRD on peritoneal dialysis ([place])  Shock ([place])  Generalized abdominal pain     ED Discharge Orders    None      Portions of this note were generated with dragon dictation software. Dictation errors may occur despite best attempts at proofreading.   Carrie Mew, MD 05/29/20 [place]    Carrie Mew, MD 05/29/20 1511

## 2020-05-29 NOTE — Progress Notes (Addendum)
Cross cover  1.Nurse reports of blood in stool.  Hemodynamically stable.  Patient admitted with Covid gastroenteritis.  Plan: Hemoglobin ordered.  Continue to trend 2.  Nurse concerned that peritoneal dialysis not ordered.  Spoke with Dr. Holley Raring via phone who advised that patient does not need peritoneal dialysis tonight and may worsen his hypotension.  Not in need of emergent dialysis 3.concerned that no fluid culture sent: Cell count and culture of peritoneal fluid ordered

## 2020-05-29 NOTE — ED Notes (Signed)
Pt again requesting pain medication. Will message admitting MD

## 2020-05-29 NOTE — ED Notes (Signed)
Date and time results received: 05/29/20 1441 (use smartphrase ".now" to insert current time)  Test: Covid Critical Value: Positive  Name of Provider Notified: Joni Fears  Orders Received? Or Actions Taken?: Orders Received - See Orders for details

## 2020-05-29 NOTE — ED Notes (Signed)
Dialysis at bedside at this time.

## 2020-05-29 NOTE — Progress Notes (Signed)
PHARMACY -  BRIEF ANTIBIOTIC NOTE   Pharmacy has received consult(s) for Vanc and Cefepime from an ED provider.  The patient's profile has been reviewed for ht/wt/allergies/indication/available labs.    One time order(s) placed by MD for Vancomycin 1 gram and Cefepime 2 gm  Further antibiotics/pharmacy consults should be ordered by admitting physician if indicated.                       Thank you, Satin Boal A 05/29/2020  1:11 PM

## 2020-05-30 LAB — BODY FLUID CELL COUNT WITH DIFFERENTIAL
Eos, Fluid: 0 %
Lymphs, Fluid: 66 %
Monocyte-Macrophage-Serous Fluid: 34 % — ABNORMAL LOW (ref 50–90)
Neutrophil Count, Fluid: 0 % (ref 0–25)
Total Nucleated Cell Count, Fluid: 91 cu mm (ref 0–1000)

## 2020-05-30 LAB — COMPREHENSIVE METABOLIC PANEL
ALT: 14 U/L (ref 0–44)
AST: 26 U/L (ref 15–41)
Albumin: 2.9 g/dL — ABNORMAL LOW (ref 3.5–5.0)
Alkaline Phosphatase: 107 U/L (ref 38–126)
Anion gap: 17 — ABNORMAL HIGH (ref 5–15)
BUN: 94 mg/dL — ABNORMAL HIGH (ref 6–20)
CO2: 21 mmol/L — ABNORMAL LOW (ref 22–32)
Calcium: 7.9 mg/dL — ABNORMAL LOW (ref 8.9–10.3)
Chloride: 89 mmol/L — ABNORMAL LOW (ref 98–111)
Creatinine, Ser: 13.11 mg/dL — ABNORMAL HIGH (ref 0.61–1.24)
GFR calc Af Amer: 5 mL/min — ABNORMAL LOW (ref >60)
GFR calc non Af Amer: 4 mL/min — ABNORMAL LOW (ref >60)
Glucose, Bld: 114 mg/dL — ABNORMAL HIGH (ref 70–99)
Potassium: 4.4 mmol/L (ref 3.5–5.1)
Sodium: 127 mmol/L — ABNORMAL LOW (ref 135–145)
Total Bilirubin: 0.7 mg/dL (ref 0.3–1.2)
Total Protein: 5.9 g/dL — ABNORMAL LOW (ref 6.5–8.1)

## 2020-05-30 LAB — BASIC METABOLIC PANEL
Anion gap: 16 — ABNORMAL HIGH (ref 5–15)
BUN: 101 mg/dL — ABNORMAL HIGH (ref 6–20)
CO2: 20 mmol/L — ABNORMAL LOW (ref 22–32)
Calcium: 7.1 mg/dL — ABNORMAL LOW (ref 8.9–10.3)
Chloride: 90 mmol/L — ABNORMAL LOW (ref 98–111)
Creatinine, Ser: 12.61 mg/dL — ABNORMAL HIGH (ref 0.61–1.24)
GFR calc Af Amer: 5 mL/min — ABNORMAL LOW (ref >60)
GFR calc non Af Amer: 5 mL/min — ABNORMAL LOW (ref >60)
Glucose, Bld: 121 mg/dL — ABNORMAL HIGH (ref 70–99)
Potassium: 4.5 mmol/L (ref 3.5–5.1)
Sodium: 126 mmol/L — ABNORMAL LOW (ref 135–145)

## 2020-05-30 LAB — HIV ANTIBODY (ROUTINE TESTING W REFLEX): HIV Screen 4th Generation wRfx: NONREACTIVE

## 2020-05-30 LAB — CBC WITH DIFFERENTIAL/PLATELET
Abs Immature Granulocytes: 0.01 10*3/uL (ref 0.00–0.07)
Basophils Absolute: 0 10*3/uL (ref 0.0–0.1)
Basophils Relative: 0 %
Eosinophils Absolute: 0 10*3/uL (ref 0.0–0.5)
Eosinophils Relative: 0 %
HCT: 27 % — ABNORMAL LOW (ref 39.0–52.0)
Hemoglobin: 9.7 g/dL — ABNORMAL LOW (ref 13.0–17.0)
Immature Granulocytes: 0 %
Lymphocytes Relative: 11 %
Lymphs Abs: 0.3 10*3/uL — ABNORMAL LOW (ref 0.7–4.0)
MCH: 32.3 pg (ref 26.0–34.0)
MCHC: 35.9 g/dL (ref 30.0–36.0)
MCV: 90 fL (ref 80.0–100.0)
Monocytes Absolute: 0.1 10*3/uL (ref 0.1–1.0)
Monocytes Relative: 5 %
Neutro Abs: 1.9 10*3/uL (ref 1.7–7.7)
Neutrophils Relative %: 84 %
Platelets: 160 10*3/uL (ref 150–400)
RBC: 3 MIL/uL — ABNORMAL LOW (ref 4.22–5.81)
RDW: 14.1 % (ref 11.5–15.5)
WBC: 2.3 10*3/uL — ABNORMAL LOW (ref 4.0–10.5)
nRBC: 0 % (ref 0.0–0.2)

## 2020-05-30 LAB — CBC
HCT: 28.5 % — ABNORMAL LOW (ref 39.0–52.0)
Hemoglobin: 9.8 g/dL — ABNORMAL LOW (ref 13.0–17.0)
MCH: 31.7 pg (ref 26.0–34.0)
MCHC: 34.4 g/dL (ref 30.0–36.0)
MCV: 92.2 fL (ref 80.0–100.0)
Platelets: 149 10*3/uL — ABNORMAL LOW (ref 150–400)
RBC: 3.09 MIL/uL — ABNORMAL LOW (ref 4.22–5.81)
RDW: 14.2 % (ref 11.5–15.5)
WBC: 2.7 10*3/uL — ABNORMAL LOW (ref 4.0–10.5)
nRBC: 0 % (ref 0.0–0.2)

## 2020-05-30 LAB — PATHOLOGIST SMEAR REVIEW

## 2020-05-30 LAB — PHOSPHORUS: Phosphorus: 10.5 mg/dL — ABNORMAL HIGH (ref 2.5–4.6)

## 2020-05-30 LAB — PROCALCITONIN: Procalcitonin: 8.87 ng/mL

## 2020-05-30 LAB — FIBRIN DERIVATIVES D-DIMER (ARMC ONLY): Fibrin derivatives D-dimer (ARMC): 681.59 ng/mL (FEU) — ABNORMAL HIGH (ref 0.00–499.00)

## 2020-05-30 LAB — C-REACTIVE PROTEIN: CRP: 15.5 mg/dL — ABNORMAL HIGH (ref <1.0)

## 2020-05-30 LAB — FERRITIN: Ferritin: 732 ng/mL — ABNORMAL HIGH (ref 24–336)

## 2020-05-30 LAB — MAGNESIUM: Magnesium: 2 mg/dL (ref 1.7–2.4)

## 2020-05-30 MED ORDER — HYDROMORPHONE HCL 1 MG/ML IJ SOLN
0.5000 mg | INTRAMUSCULAR | Status: DC | PRN
  Administered 2020-05-30 – 2020-05-31 (×5): 0.5 mg via INTRAVENOUS
  Filled 2020-05-30 (×5): qty 1

## 2020-05-30 MED ORDER — HYDROMORPHONE HCL 1 MG/ML IJ SOLN: 0.5000 mg | INTRAMUSCULAR | Status: DC | PRN

## 2020-05-30 MED ORDER — METHYLPREDNISOLONE SODIUM SUCC 40 MG IJ SOLR
10.0000 mg | Freq: Every day | INTRAMUSCULAR | Status: DC
  Administered 2020-05-30: 10 mg via INTRAVENOUS
  Filled 2020-05-30: qty 1

## 2020-05-30 MED ORDER — VITAMIN D (ERGOCALCIFEROL) 1.25 MG (50000 UNIT) PO CAPS: 50000.0000 [IU] | ORAL_CAPSULE | ORAL | Status: DC

## 2020-05-30 MED ORDER — INFLUENZA VAC SPLIT QUAD 0.5 ML IM SUSY
0.5000 mL | PREFILLED_SYRINGE | INTRAMUSCULAR | Status: DC
  Filled 2020-05-30: qty 0.5

## 2020-05-30 MED ORDER — METHYLPREDNISOLONE SODIUM SUCC 40 MG IJ SOLR
40.0000 mg | Freq: Every day | INTRAMUSCULAR | Status: DC
  Administered 2020-05-31: 40 mg via INTRAVENOUS
  Filled 2020-05-30: qty 1

## 2020-05-30 MED ORDER — HYDROMORPHONE HCL 1 MG/ML IJ SOLN
0.5000 mg | Freq: Once | INTRAMUSCULAR | Status: AC
  Administered 2020-05-30: 0.5 mg via INTRAVENOUS
  Filled 2020-05-30: qty 1

## 2020-05-30 MED ORDER — PIPERACILLIN-TAZOBACTAM IN DEX 2-0.25 GM/50ML IV SOLN
2.2500 g | Freq: Three times a day (TID) | INTRAVENOUS | Status: DC
  Administered 2020-05-30 – 2020-05-31 (×3): 2.25 g via INTRAVENOUS
  Filled 2020-05-30 (×6): qty 50

## 2020-05-30 MED ORDER — GABAPENTIN 300 MG PO CAPS
300.0000 mg | ORAL_CAPSULE | Freq: Every day | ORAL | Status: DC
  Administered 2020-05-31: 09:00:00 300 mg via ORAL
  Filled 2020-05-30: qty 1

## 2020-05-30 MED ORDER — OXYCODONE HCL 5 MG PO TABS
5.0000 mg | ORAL_TABLET | Freq: Four times a day (QID) | ORAL | Status: DC | PRN
  Administered 2020-05-30 – 2020-05-31 (×3): 5 mg via ORAL
  Filled 2020-05-30 (×3): qty 1

## 2020-05-30 NOTE — ED Notes (Signed)
Per nephrology, PD to be held until further notice

## 2020-05-30 NOTE — ED Notes (Signed)
ED TO INPATIENT HANDOFF REPORT  ED Nurse Name and Phone #:  Zeb Comfort RN [phone]  S Name/Age/Gender John Cline 34 y.o. male Room/Bed: ED01A/ED01A  Code Status   Code Status: Full Code  Home/SNF/Other Home  Is this baseline? Yes   Triage Complete: Triage complete  Chief Complaint COVID-19 virus infection [U07.1] Hypotension due to hypovolemia [I95.89, E86.1] Acute on chronic pancreatitis ([place]) [K85.90, K86.1]  Triage Note Pt presents to ED via [facility] with c/o hypotension, per [facility] pt does home dialysis 5 times/day has been having diarrhea x 2 days. Per [facility] initial BP 50/30, placed patient in trendelenburg, BP 70/39 after trendelenburg, per EMS unable to obtain IV access.   EMS reports that patient wants to be transferred to [facility], pt reports last PD approx 2 hrs ago. On assessment abdominal pain and distention noted with palpation.   Pt also c/o fever at home, Tmax at home 100.1. Pt appears pale, lethargic. Pt placed in trendelenburg by this RN upon BP reading.     Allergies Allergies  Allergen Reactions  . Dapsone Other (See Comments)    Methemoglobinemia    Other reaction(s): Other (See Comments) Childhood allergies methemoglobinemia Other reaction(s): Unknown   . Morphine Hives and Rash    Per patient, this is just redness and itching at injection site. Confusion Other reaction(s): Unknown   . Lipase Other (See Comments)    Constipation  Other reaction(s): Other (See Comments) Constipation Other reaction(s): Unknown  . Morphine And Related Rash    Level of Care/Admitting Diagnosis ED Disposition    ED Disposition Condition [facility] Area: [place] [[id number]]  Level of Care: Med-Surg [16]  Covid Evaluation: Confirmed COVID Positive  Diagnosis: Acute on chronic pancreatitis [facility]) [[id number]]  Admitting Physician: Lorella Nimrod [[id number]]  Attending Physician: Lorella Nimrod [[id number]]  Estimated  length of stay: past midnight tomorrow  Certification:: I certify this patient will need inpatient services for at least 2 midnights       B Medical/Surgery History Past Medical History:  Diagnosis Date  . Atrial fibrillation ([place])   . Hypertension   . Seizures ([place])    Past Surgical History:  Procedure Laterality Date  . KIDNEY TRANSPLANT       A IV Location/Drains/Wounds Patient Lines/Drains/Airways Status    Active Line/Drains/Airways    Name Placement date Placement time Site Days   Peripheral IV 05/29/20 Right Wrist 05/29/20  1310  Wrist  1   Peripheral IV 05/29/20 Left;Upper Arm 05/29/20  1321  Arm  1          Intake/Output Last 24 hours  Intake/Output Summary (Last 24 hours) at 05/30/2020 1812 Last data filed at 05/30/2020 1514 Gross per 24 hour  Intake 50 ml  Output --  Net 50 ml    Labs/Imaging Results for orders placed or performed during the hospital encounter of 05/29/20 (from the past 48 hour(s))  Lactic acid, plasma     Status: None   Collection Time: 05/29/20  1:05 PM  Result Value Ref Range   Lactic Acid, Venous 1.3 0.5 - 1.9 mmol/L    Comment: Performed at [facility], [address] [place], [place] [address]  Comprehensive metabolic panel     Status: Abnormal   Collection Time: 05/29/20  1:05 PM  Result Value Ref Range   Sodium 126 (L) 135 - 145 mmol/L   Potassium 3.8 3.5 - 5.1 mmol/L   Chloride 86 (L) 98 -  111 mmol/L   CO2 23 22 - 32 mmol/L   Glucose, Bld 99 70 - 99 mg/dL    Comment: Glucose reference range applies only to samples taken after fasting for at least 8 hours.   BUN 91 (H) 6 - 20 mg/dL   Creatinine, Ser 13.62 (H) 0.61 - 1.24 mg/dL   Calcium 8.0 (L) 8.9 - 10.3 mg/dL   Total Protein 6.3 (L) 6.5 - 8.1 g/dL   Albumin 3.1 (L) 3.5 - 5.0 g/dL   AST 21 15 - 41 U/L   ALT 15 0 - 44 U/L   Alkaline Phosphatase 117 38 - 126 U/L   Total Bilirubin 0.8 0.3 - 1.2 mg/dL   GFR calc non Af Amer 4 (L) >60 mL/min   GFR calc Af  Amer 5 (L) >60 mL/min   Anion gap 17 (H) 5 - 15    Comment: Performed at [facility], [address] [place], [place] [address]  CBC WITH DIFFERENTIAL     Status: Abnormal   Collection Time: 05/29/20  1:05 PM  Result Value Ref Range   WBC 5.4 4.0 - 10.5 K/uL   RBC 3.12 (L) 4.22 - 5.81 MIL/uL   Hemoglobin 10.1 (L) 13.0 - 17.0 g/dL   HCT 29.0 (L) 39 - 52 %   MCV 92.9 80.0 - 100.0 fL   MCH 32.4 26.0 - 34.0 pg   MCHC 34.8 30.0 - 36.0 g/dL   RDW 14.4 11.5 - 15.5 %   Platelets 176 150 - 400 K/uL   nRBC 0.0 0.0 - 0.2 %   Neutrophils Relative % 78 %   Neutro Abs 4.1 1.7 - 7.7 K/uL   Lymphocytes Relative 13 %   Lymphs Abs 0.7 0.7 - 4.0 K/uL   Monocytes Relative 8 %   Monocytes Absolute 0.4 0 - 1 K/uL   Eosinophils Relative 0 %   Eosinophils Absolute 0.0 0 - 0 K/uL   Basophils Relative 0 %   Basophils Absolute 0.0 0 - 0 K/uL   Immature Granulocytes 1 %   Abs Immature Granulocytes 0.06 0.00 - 0.07 K/uL    Comment: Performed at [facility], [address] [place], [place] [address]  Protime-INR     Status: None   Collection Time: 05/29/20  1:05 PM  Result Value Ref Range   Prothrombin Time 14.6 11.4 - 15.2 seconds   INR 1.2 0.8 - 1.2    Comment: (NOTE) INR goal varies based on device and disease states. Performed at [facility], [address] [place], [place] [address]   APTT     Status: Abnormal   Collection Time: 05/29/20  1:05 PM  Result Value Ref Range   aPTT 40 (H) 24 - 36 seconds    Comment:        IF BASELINE aPTT IS ELEVATED, SUGGEST PATIENT RISK ASSESSMENT BE USED TO DETERMINE APPROPRIATE ANTICOAGULANT THERAPY. Performed at [facility], [address] [place], [place] [address]   Blood Culture (routine x 2)     Status: None (Preliminary result)   Collection Time: 05/29/20  1:05 PM   Specimen: BLOOD  Result Value Ref Range   Specimen Description BLOOD LEFT ARM    Special Requests      BOTTLES DRAWN AEROBIC AND  ANAEROBIC Blood Culture results may not be optimal due to an excessive volume of blood received in culture bottles   Culture      NO GROWTH < 24 HOURS Performed  at [facility], [address] [place], [place] [address]    Report Status PENDING   SARS Coronavirus 2 by RT PCR (hospital order, performed in [facility] hospital lab) Nasopharyngeal Nasopharyngeal Swab     Status: Abnormal   Collection Time: 05/29/20  1:06 PM   Specimen: Nasopharyngeal Swab  Result Value Ref Range   SARS Coronavirus 2 POSITIVE (A) NEGATIVE    Comment: RESULT CALLED TO, READ BACK BY AND VERIFIED WITH: MEGAN JONES @1439  05/29/20 MJU (NOTE) SARS-CoV-2 target nucleic acids are DETECTED  SARS-CoV-2 RNA is generally detectable in upper respiratory specimens  during the acute phase of infection.  Positive results are indicative  of the presence of the identified virus, but do not rule out bacterial infection or co-infection with other pathogens not detected by the test.  Clinical correlation with patient history and  other diagnostic information is necessary to determine patient infection status.  The expected result is negative.  Fact Sheet for Patients:   StrictlyIdeas.no   Fact Sheet for Healthcare Providers:   BankingDealers.co.za    This test is not yet approved or cleared by the [place] FDA and  has been authorized for detection and/or diagnosis of SARS-CoV-2 by FDA under an Emergency Use Authorization (EUA).  This EUA will remain in effect (meaning this test ca n be used) for the duration of  the COVID-19 declaration under Section 564(b)(1) of the Act, 21 U.S.C. section 360-bbb-3(b)(1), unless the authorization is terminated or revoked sooner.  Performed at [facility], [address] [place], [place] [address]   Gastrointestinal Panel by PCR , Stool     Status: None   Collection Time: 05/29/20  7:36 PM   Specimen: Stool   Result Value Ref Range   Campylobacter species NOT DETECTED NOT DETECTED   Plesimonas shigelloides NOT DETECTED NOT DETECTED   Salmonella species NOT DETECTED NOT DETECTED   Yersinia enterocolitica NOT DETECTED NOT DETECTED   Vibrio species NOT DETECTED NOT DETECTED   Vibrio cholerae NOT DETECTED NOT DETECTED   Enteroaggregative E coli (EAEC) NOT DETECTED NOT DETECTED   Enteropathogenic E coli (EPEC) NOT DETECTED NOT DETECTED   Enterotoxigenic E coli (ETEC) NOT DETECTED NOT DETECTED   Shiga like toxin producing E coli (STEC) NOT DETECTED NOT DETECTED   Shigella/Enteroinvasive E coli (EIEC) NOT DETECTED NOT DETECTED   Cryptosporidium NOT DETECTED NOT DETECTED   Cyclospora cayetanensis NOT DETECTED NOT DETECTED   Entamoeba histolytica NOT DETECTED NOT DETECTED   Giardia lamblia NOT DETECTED NOT DETECTED   Adenovirus F40/41 NOT DETECTED NOT DETECTED   Astrovirus NOT DETECTED NOT DETECTED   Norovirus GI/GII NOT DETECTED NOT DETECTED   Rotavirus A NOT DETECTED NOT DETECTED   Sapovirus (I, II, IV, and V) NOT DETECTED NOT DETECTED    Comment: Performed at [facility], [address] [place], [place] [address]  C Difficile Quick Screen w PCR reflex     Status: None   Collection Time: 05/29/20  7:36 PM   Specimen: Stool  Result Value Ref Range   C Diff antigen NEGATIVE NEGATIVE   C Diff toxin NEGATIVE NEGATIVE   C Diff interpretation No C. difficile detected.     Comment: Performed at [facility], [address] [place], [place] [address]  HIV Antibody (routine testing w rflx)     Status: None   Collection Time: 05/29/20  7:36 PM  Result Value Ref Range   HIV Screen 4th Generation wRfx Non Reactive Non  Reactive    Comment: Performed at [facility], [address] [place], [place] [address]  Lipase, blood     Status: Abnormal   Collection Time: 05/29/20  7:36 PM  Result Value Ref Range   Lipase 1,272 (H) 11 - 51 U/L    Comment: RESULT CONFIRMED BY MANUAL  DILUTION RH Performed at [facility], [address] [place], [place] [address]   Blood Culture (routine x 2)     Status: None (Preliminary result)   Collection Time: 05/29/20  8:11 PM   Specimen: BLOOD  Result Value Ref Range   Specimen Description BLOOD RIGHT FA    Special Requests      BOTTLES DRAWN AEROBIC AND ANAEROBIC Blood Culture results may not be optimal due to an excessive volume of blood received in culture bottles   Culture      NO GROWTH < 12 HOURS Performed at [facility], [address] [place], [place] [address]    Report Status PENDING   CBC     Status: Abnormal   Collection Time: 05/30/20 12:21 AM  Result Value Ref Range   WBC 2.7 (L) 4.0 - 10.5 K/uL   RBC 3.09 (L) 4.22 - 5.81 MIL/uL   Hemoglobin 9.8 (L) 13.0 - 17.0 g/dL   HCT 28.5 (L) 39 - 52 %   MCV 92.2 80.0 - 100.0 fL   MCH 31.7 26.0 - 34.0 pg   MCHC 34.4 30.0 - 36.0 g/dL   RDW 14.2 11.5 - 15.5 %   Platelets 149 (L) 150 - 400 K/uL   nRBC 0.0 0.0 - 0.2 %    Comment: Performed at [facility], [address] [place], [place] [address]  Body fluid cell count with differential     Status: Abnormal   Collection Time: 05/30/20 12:21 AM  Result Value Ref Range   Fluid Type-FCT PERITONEAL     Comment: CORRECTED ON 09/16 AT 0034: PREVIOUSLY REPORTED AS Peritoneal   Color, Fluid COLORLESS    Appearance, Fluid CLEAR (A) CLEAR   Total Nucleated Cell Count, Fluid 91 0 - 1,000 cu mm   Neutrophil Count, Fluid 0 0 - 25 %   Lymphs, Fluid 66 %   Monocyte-Macrophage-Serous Fluid 34 (L) 50 - 90 %   Eos, Fluid 0 %    Comment: Performed at [facility], [address] [place], [place] [address]  Body fluid culture     Status: None (Preliminary result)   Collection Time: 05/30/20 12:21 AM   Specimen: Peritoneal Washings; Body Fluid  Result Value Ref Range   Specimen Description      PERITONEAL Performed at [facility], [address], [place], [place] [address]     Special Requests      NONE Performed at [facility], [place], [place] [address]    Gram Stain      NO WBC SEEN NO ORGANISMS SEEN Performed at [facility], [address] [place], [place] [address]    Culture PENDING    Report Status PENDING   Pathologist smear review     Status: None   Collection Time: 05/30/20 12:21 AM  Result Value Ref Range   Path Review Cytospin of peritoneal fluid reviewed.     Comment: Patient on dialysis for end stage renal failure. Admitted for COVID19 gastroenteritis. Negative for malignancy.  Reviewed by Dellia Nims Reuel Derby, M.D. Performed at [facility], [address],  [place], [place] [address]   CBC with Differential/Platelet     Status: Abnormal   Collection Time: 05/30/20  5:07 AM  Result Value Ref Range   WBC 2.3 (L) 4.0 - 10.5 K/uL   RBC 3.00 (L) 4.22 - 5.81 MIL/uL   Hemoglobin 9.7 (L) 13.0 - 17.0 g/dL   HCT 27.0 (L) 39 - 52 %   MCV 90.0 80.0 - 100.0 fL   MCH 32.3 26.0 - 34.0 pg   MCHC 35.9 30.0 - 36.0 g/dL   RDW 14.1 11.5 - 15.5 %   Platelets 160 150 - 400 K/uL   nRBC 0.0 0.0 - 0.2 %   Neutrophils Relative % 84 %   Neutro Abs 1.9 1.7 - 7.7 K/uL   Lymphocytes Relative 11 %   Lymphs Abs 0.3 (L) 0.7 - 4.0 K/uL   Monocytes Relative 5 %   Monocytes Absolute 0.1 0 - 1 K/uL   Eosinophils Relative 0 %   Eosinophils Absolute 0.0 0 - 0 K/uL   Basophils Relative 0 %   Basophils Absolute 0.0 0 - 0 K/uL   Immature Granulocytes 0 %   Abs Immature Granulocytes 0.01 0.00 - 0.07 K/uL    Comment: Performed at [facility], [address] [place], [place] [address]  Comprehensive metabolic panel     Status: Abnormal   Collection Time: 05/30/20  5:07 AM  Result Value Ref Range   Sodium 127 (L) 135 - 145 mmol/L   Potassium 4.4 3.5 - 5.1 mmol/L   Chloride 89 (L) 98 - 111 mmol/L   CO2 21 (L) 22 - 32 mmol/L   Glucose, Bld 114 (H) 70 - 99 mg/dL    Comment: Glucose reference range applies only to  samples taken after fasting for at least 8 hours.   BUN 94 (H) 6 - 20 mg/dL   Creatinine, Ser 13.11 (H) 0.61 - 1.24 mg/dL   Calcium 7.9 (L) 8.9 - 10.3 mg/dL   Total Protein 5.9 (L) 6.5 - 8.1 g/dL   Albumin 2.9 (L) 3.5 - 5.0 g/dL   AST 26 15 - 41 U/L   ALT 14 0 - 44 U/L   Alkaline Phosphatase 107 38 - 126 U/L   Total Bilirubin 0.7 0.3 - 1.2 mg/dL   GFR calc non Af Amer 4 (L) >60 mL/min   GFR calc Af Amer 5 (L) >60 mL/min   Anion gap 17 (H) 5 - 15    Comment: Performed at [facility], [address] [place], [place] [address]  C-reactive protein     Status: Abnormal   Collection Time: 05/30/20  5:07 AM  Result Value Ref Range   CRP 15.5 (H) <1.0 mg/dL    Comment: Performed at [facility], [place] [address], [place], [place] [address]  Fibrin derivatives D-Dimer [facility] only)     Status: Abnormal   Collection Time: 05/30/20  5:07 AM  Result Value Ref Range   Fibrin derivatives D-dimer ([facility]) 681.59 (H) 0.00 - 499.00 ng/mL (FEU)    Comment: (NOTE) <> Exclusion of Venous Thromboembolism (VTE) - OUTPATIENT ONLY   (Emergency Department or [place])    0-499 ng/ml (FEU): With a low to intermediate pretest probability                      for VTE this test result excludes the diagnosis  of VTE.   >499 ng/ml (FEU) : VTE not excluded; additional work up for VTE is                      required.  <> Testing on Inpatients and Evaluation of Disseminated Intravascular   Coagulation (DIC) Reference Range:   0-499 ng/ml (FEU) Performed at [facility], [address] [place], [place] [address]   Ferritin     Status: Abnormal   Collection Time: 05/30/20  5:07 AM  Result Value Ref Range   Ferritin 732 (H) 24 - 336 ng/mL    Comment: Performed at [facility], [address], [place], [place] [address]  Magnesium     Status: None   Collection Time: 05/30/20  5:07 AM  Result Value Ref Range   Magnesium 2.0 1.7 - 2.4 mg/dL    Comment:  Performed at [facility], [address], [place], [place] [address]  Phosphorus     Status: Abnormal   Collection Time: 05/30/20  5:07 AM  Result Value Ref Range   Phosphorus 10.5 (H) 2.5 - 4.6 mg/dL    Comment: Performed at [facility], [address] [place], [place] [address]  Procalcitonin - Baseline     Status: None   Collection Time: 05/30/20  5:07 AM  Result Value Ref Range   Procalcitonin 8.87 ng/mL    Comment:        Interpretation: PCT > 2 ng/mL: Systemic infection (sepsis) is likely, unless other causes are known. (NOTE)       Sepsis PCT Algorithm           Lower Respiratory Tract                                      Infection PCT Algorithm    ----------------------------     ----------------------------         PCT < 0.25 ng/mL                PCT < 0.10 ng/mL          Strongly encourage             Strongly discourage   discontinuation of antibiotics    initiation of antibiotics    ----------------------------     -----------------------------       PCT 0.25 - 0.50 ng/mL            PCT 0.10 - 0.25 ng/mL               OR       >80% decrease in PCT            Discourage initiation of                                            antibiotics      Encourage discontinuation           of antibiotics    ----------------------------     -----------------------------         PCT >= 0.50 ng/mL              PCT 0.26 - 0.50 ng/mL               AND       <  80% decrease in PCT              Encourage initiation of                                             antibiotics       Encourage continuation           of antibiotics    ----------------------------     -----------------------------        PCT >= 0.50 ng/mL                  PCT > 0.50 ng/mL               AND         increase in PCT                  Strongly encourage                                      initiation of antibiotics    Strongly encourage escalation           of antibiotics                                      -----------------------------                                           PCT <= 0.25 ng/mL                                                 OR                                        > 80% decrease in PCT                                      Discontinue / Do not initiate                                             antibiotics  Performed at [facility], [address] [place], [place] [address]    CT ABDOMEN PELVIS W CONTRAST  Result Date: 05/29/2020 CLINICAL DATA:  Abdominal pain and fever EXAM: CT ABDOMEN AND PELVIS WITH CONTRAST TECHNIQUE: Multidetector CT imaging of the abdomen and pelvis was performed using the standard protocol following bolus administration of intravenous contrast. CONTRAST:  175mL OMNIPAQUE IOHEXOL 300 MG/ML  SOLN COMPARISON:  December 30, 2019 FINDINGS: Lower chest: The visualized heart size within normal limits. No pericardial fluid/thickening. No hiatal hernia. Subpleural patchy airspace opacity seen at lung bases. Hepatobiliary: The liver is normal in density without focal  abnormality.The main portal vein is patent. No evidence of calcified gallstones, gallbladder wall thickening or biliary dilatation. Pancreas: Unremarkable. No pancreatic ductal dilatation or surrounding inflammatory changes. Spleen: Normal in size without focal abnormality. Adrenals/Urinary Tract: Both adrenal glands appear normal. The patient is status post bilateral nephrectomy. A left lower quadrant renal transplant is present. Mild to moderate pelvicaliectasis ureterectasis noted with mild urothelial hyperenhancement. The bladder is partially decompressed with mild diffuse bladder wall thickening. Stomach/Bowel: The stomach is normal in appearance. There is question of mesenteric edema and clumping of small bowel in the splenic flexure within the left upper quadrant with bowel wall edema. Vascular/Lymphatic: There are no enlarged mesenteric, retroperitoneal, or pelvic lymph  nodes. Scattered atherosclerosis at the origin the renal arteries right iliac vasculature. Reproductive: The prostate is unremarkable. Other: A peritoneal dialysis catheter coiled within the deep pelvis. There is a small amount of abdominal ascites. Musculoskeletal: No acute or significant osseous findings. IMPRESSION: Mild bilateral patchy airspace opacities at both lung bases which could be due to atelectasis and/or early infectious etiology. Small abdominopelvic ascites. The bladder is partially decompressed with apparent thickening which could be due to cystitis versus physiologic under distension. Mild edematous appearance to the splenic flexure and small bowel loops. Findings may be infectious, inflammatory, or secondary to hypoalbuminemia. Electronically Signed   By: Prudencio Pair M.D.   On: 05/29/2020 15:31   DG Chest Port 1 View  Result Date: 05/29/2020 CLINICAL DATA:  Question sepsis.  Dialysis patient. EXAM: PORTABLE CHEST 1 VIEW COMPARISON:  01/01/2020 FINDINGS: Mild patchy peripheral infiltrates have developed since the prior study. Heart size within normal limits. Vascularity normal. No pleural effusion. IMPRESSION: Mild patchy peripheral infiltrates bilaterally. Probable pneumonia. Correlate with COVID-19 status. Electronically Signed   By: Franchot Gallo M.D.   On: 05/29/2020 13:52    Pending Labs Unresulted Labs (From admission, onward)          Start     Ordered   05/31/20 0500  Procalcitonin  Daily,   STAT      05/30/20 0743   05/30/20 6734  Basic metabolic panel  ONCE - STAT,   STAT        05/30/20 1754   05/30/20 0500  CBC with Differential/Platelet  Daily,   STAT      05/29/20 1515   05/30/20 0500  Comprehensive metabolic panel  Daily,   STAT      05/29/20 1515   05/30/20 0500  C-reactive protein  Daily,   STAT      05/29/20 1515   05/30/20 0500  Fibrin derivatives D-Dimer ([place] only)  Daily,   STAT      05/29/20 1515   05/30/20 0500  Ferritin  Daily,   STAT       05/29/20 1515   05/30/20 0500  Magnesium  Daily,   STAT      05/29/20 1515   05/30/20 0500  Phosphorus  Daily,   STAT      05/29/20 1515   05/29/20 1305  Urinalysis, Complete w Microscopic  (Septic presentation on arrival (screening labs, nursing and treatment orders for obvious sepsis))  ONCE - STAT,   STAT        05/29/20 1305   05/29/20 1305  Urine culture  (Septic presentation on arrival (screening labs, nursing and treatment orders for obvious sepsis))  ONCE - STAT,   STAT        05/29/20 1305          Vitals/Pain Today's  Vitals   05/30/20 1145 05/30/20 1200 05/30/20 1332 05/30/20 1600  BP: (!) 132/95 (!) 125/99 (!) 120/99 (!) 127/102  Pulse: 82 84 84 86  Resp: 10 10 16 13   Temp:   97.6 F (36.4 C)   TempSrc:   Oral   SpO2: 100% 98% 98% 99%  Weight:      Height:      PainSc:   9      Isolation Precautions Airborne and Contact precautions  Medications Medications  remdesivir 200 mg in sodium chloride 0.9% 250 mL IVPB (0 mg Intravenous Stopped 05/29/20 1748)    Followed by  remdesivir 100 mg in sodium chloride 0.9 % 100 mL IVPB (0 mg Intravenous Stopped 05/30/20 1044)  acetaminophen (TYLENOL) tablet 500-1,000 mg (has no administration in time range)  DULoxetine (CYMBALTA) DR capsule 30 mg (30 mg Oral Given 05/30/20 0949)  mirtazapine (REMERON) tablet 15 mg (15 mg Oral Given 05/29/20 2300)  cinacalcet (SENSIPAR) tablet 30 mg (30 mg Oral Given 05/30/20 0950)  lipase/protease/amylase (CREON) capsule 24,000 Units (24,000 Units Oral Refused 05/30/20 1725)  sevelamer carbonate (RENVELA) tablet 1,600 mg (1,600 mg Oral Refused 05/30/20 1725)  tamsulosin (FLOMAX) capsule 0.4 mg (0.4 mg Oral Given 05/30/20 0949)  apixaban (ELIQUIS) tablet 2.5 mg (2.5 mg Oral Given 05/30/20 0949)  vitamin B-12 (CYANOCOBALAMIN) tablet 1,000 mcg (1,000 mcg Oral Given 05/30/20 0950)  gabapentin (NEURONTIN) capsule 300 mg (300 mg Oral Given 05/30/20 0949)  lacosamide (VIMPAT) tablet 100 mg (100 mg Oral Given  05/30/20 0949)  lamoTRIgine (LAMICTAL) tablet 100 mg (100 mg Oral Given 05/30/20 0949)  levETIRAcetam (KEPPRA) tablet 1,000 mg (1,000 mg Oral Given 05/29/20 2300)  albuterol (VENTOLIN HFA) 108 (90 Base) MCG/ACT inhaler 2 puff (has no administration in time range)  sodium chloride flush (NS) 0.9 % injection 3 mL (3 mLs Intravenous Given 05/30/20 0950)  sodium chloride flush (NS) 0.9 % injection 3 mL (has no administration in time range)  0.9 %  sodium chloride infusion (has no administration in time range)  ascorbic acid (VITAMIN C) tablet 500 mg (500 mg Oral Given 05/30/20 0949)  zinc sulfate capsule 220 mg (220 mg Oral Given 05/30/20 0948)  ondansetron (ZOFRAN) tablet 4 mg (has no administration in time range)    Or  ondansetron (ZOFRAN) injection 4 mg (has no administration in time range)  acetaminophen (OFIRMEV) IV 1,000 mg (1,000 mg Intravenous Refused 05/30/20 1343)  Vitamin D (Ergocalciferol) (DRISDOL) capsule 50,000 Units (has no administration in time range)  oxyCODONE (Oxy IR/ROXICODONE) immediate release tablet 5 mg (5 mg Oral Given 05/30/20 1334)  methylPREDNISolone sodium succinate (SOLU-MEDROL) 40 mg/mL injection 40 mg (has no administration in time range)  piperacillin-tazobactam (ZOSYN) IVPB 2.25 g (0 g Intravenous Stopped 05/30/20 1514)  HYDROmorphone (DILAUDID) injection 0.5 mg (0.5 mg Intravenous Given 05/30/20 1547)  ceFEPIme (MAXIPIME) 2 g in sodium chloride 0.9 % 100 mL IVPB (0 g Intravenous Stopped 05/29/20 1406)  metroNIDAZOLE (FLAGYL) IVPB 500 mg (0 mg Intravenous Stopped 05/29/20 1513)  vancomycin (VANCOCIN) IVPB 1000 mg/200 mL premix (0 mg Intravenous Stopped 05/29/20 1624)  lactated ringers bolus 1,000 mL (0 mLs Intravenous Stopped 05/29/20 1517)    And  lactated ringers bolus 1,000 mL (0 mLs Intravenous Stopped 05/29/20 1517)  ketamine (KETALAR) injection 18 mg (18 mg Intravenous Given 05/29/20 1525)  iohexol (OMNIPAQUE) 300 MG/ML solution 100 mL (100 mLs Intravenous Contrast  Given 05/29/20 1500)  dexamethasone (DECADRON) injection 10 mg (10 mg Intravenous Given 05/29/20 1519)  lactated ringers bolus 1,000  mL (0 mLs Intravenous Stopped 05/29/20 1719)  HYDROmorphone (DILAUDID) injection 0.5 mg (0.5 mg Intravenous Given 05/30/20 0954)    Mobility walks Low fall risk   Focused Assessments    R Recommendations: See Admitting Provider Note  Report given to:

## 2020-05-30 NOTE — Progress Notes (Signed)
[facility]  ROUNDING NOTE   Subjective:  Patient well-known to John Cline from prior admissions. Comes in now with diarrhea, cough, nausea, and poor oral intake. Had a very low blood pressure of 50/30 when he first arrived. Found to be Covid positive despit being vaccinated and having received a booster dose. Peritoneal dialysis was held given hypotension yesterday. Potassium currently normal at 4.4.  Objective:  Vital signs in last 24 hours:  Temp:  [99.1 F (37.3 C)] 99.1 F (37.3 C) (09/15 1317) Pulse Rate:  [41-89] 70 (09/16 0500) Resp:  [14-21] 15 (09/16 0021) BP: (61-120)/(50-97) 120/97 (09/16 0500) SpO2:  [91 %-99 %] 99 % (09/16 0021) Weight:  [59 kg] 59 kg (09/15 1318)  Weight change:  Filed Weights   05/29/20 1318  Weight: 59 kg    Intake/Output: I/O last 3 completed shifts: In: 3650 [IV Piggyback:3650] Out: -    Intake/Output this shift:  No intake/output data recorded.  Physical Exam: General:  No acute distress  Head:  Normocephalic, atraumatic. Moist oral mucosal membranes  Eyes:  Anicteric  Neck:  Supple  Lungs:   Normal respiratory effort  Heart:  Regular  Abdomen:   Soft, mild diffuse tenderness  Extremities:  No peripheral edema.  Neurologic:  Awake, alert, following commands  Skin:  No lesions  Access:  PD catheter in place    Basic Metabolic Panel: Recent Labs  Lab 05/29/20 1305 05/30/20 0507  NA 126* 127*  K 3.8 4.4  CL 86* 89*  CO2 23 21*  GLUCOSE 99 114*  BUN 91* 94*  CREATININE 13.62* 13.11*  CALCIUM 8.0* 7.9*  MG  --  2.0  PHOS  --  10.5*    Liver Function Tests: Recent Labs  Lab 05/29/20 1305 05/30/20 0507  AST 21 26  ALT 15 14  ALKPHOS 117 107  BILITOT 0.8 0.7  PROT 6.3* 5.9*  ALBUMIN 3.1* 2.9*   Recent Labs  Lab 05/29/20 1936  LIPASE 1,272*   No results for input(s): AMMONIA in the last 168 hours.  CBC: Recent Labs  Lab 05/29/20 1305 05/30/20 0021 05/30/20 0507  WBC 5.4 2.7* 2.3*  NEUTROABS  4.1  --  1.9  HGB 10.1* 9.8* 9.7*  HCT 29.0* 28.5* 27.0*  MCV 92.9 92.2 90.0  PLT 176 149* 160    Cardiac Enzymes: No results for input(s): CKTOTAL, CKMB, CKMBINDEX, TROPONINI in the last 168 hours.  BNP: Invalid input(s): POCBNP  CBG: No results for input(s): GLUCAP in the last 168 hours.  Microbiology: Results for orders placed or performed during the hospital encounter of 05/29/20  Blood Culture (routine x 2)     Status: None (Preliminary result)   Collection Time: 05/29/20  1:05 PM   Specimen: BLOOD  Result Value Ref Range Status   Specimen Description BLOOD LEFT ARM  Final   Special Requests   Final    BOTTLES DRAWN AEROBIC AND ANAEROBIC Blood Culture results may not be optimal due to an excessive volume of blood received in culture bottles   Culture   Final    NO GROWTH < 24 HOURS Performed at [facility], [address], [place], [place] [address]    Report Status PENDING  Incomplete  SARS Coronavirus 2 by RT PCR (hospital order, performed in [place] hospital lab) Nasopharyngeal Nasopharyngeal Swab     Status: Abnormal   Collection Time: 05/29/20  1:06 PM   Specimen: Nasopharyngeal Swab  Result Value Ref Range Status   SARS Coronavirus  2 POSITIVE (A) NEGATIVE Final    Comment: RESULT CALLED TO, READ BACK BY AND VERIFIED WITH: MEGAN JONES @1439  05/29/20 MJU (NOTE) SARS-CoV-2 target nucleic acids are DETECTED  SARS-CoV-2 RNA is generally detectable in upper respiratory specimens  during the acute phase of infection.  Positive results are indicative  of the presence of the identified virus, but do not rule out bacterial infection or co-infection with other pathogens not detected by the test.  Clinical correlation with patient history and  other diagnostic information is necessary to determine patient infection status.  The expected result is negative.  Fact Sheet for Patients:   StrictlyIdeas.no   Fact Sheet for Healthcare  Providers:   BankingDealers.co.za    This test is not yet approved or cleared by the [place] FDA and  has been authorized for detection and/or diagnosis of SARS-CoV-2 by FDA under an Emergency Use Authorization (EUA).  This EUA will remain in effect (meaning this test ca n be used) for the duration of  the COVID-19 declaration under Section 564(b)(1) of the Act, 21 U.S.C. section 360-bbb-3(b)(1), unless the authorization is terminated or revoked sooner.  Performed at [facility], [address] [place], [place] [address]   Gastrointestinal Panel by PCR , Stool     Status: None   Collection Time: 05/29/20  7:36 PM   Specimen: Stool  Result Value Ref Range Status   Campylobacter species NOT DETECTED NOT DETECTED Final   Plesimonas shigelloides NOT DETECTED NOT DETECTED Final   Salmonella species NOT DETECTED NOT DETECTED Final   Yersinia enterocolitica NOT DETECTED NOT DETECTED Final   Vibrio species NOT DETECTED NOT DETECTED Final   Vibrio cholerae NOT DETECTED NOT DETECTED Final   Enteroaggregative E coli (EAEC) NOT DETECTED NOT DETECTED Final   Enteropathogenic E coli (EPEC) NOT DETECTED NOT DETECTED Final   Enterotoxigenic E coli (ETEC) NOT DETECTED NOT DETECTED Final   Shiga like toxin producing E coli (STEC) NOT DETECTED NOT DETECTED Final   Shigella/Enteroinvasive E coli (EIEC) NOT DETECTED NOT DETECTED Final   Cryptosporidium NOT DETECTED NOT DETECTED Final   Cyclospora cayetanensis NOT DETECTED NOT DETECTED Final   Entamoeba histolytica NOT DETECTED NOT DETECTED Final   Giardia lamblia NOT DETECTED NOT DETECTED Final   Adenovirus F40/41 NOT DETECTED NOT DETECTED Final   Astrovirus NOT DETECTED NOT DETECTED Final   Norovirus GI/GII NOT DETECTED NOT DETECTED Final   Rotavirus A NOT DETECTED NOT DETECTED Final   Sapovirus (I, II, IV, and V) NOT DETECTED NOT DETECTED Final    Comment: Performed at [facility], [address] [place], [place] [address]  C Difficile Quick Screen w PCR reflex     Status: None   Collection Time: 05/29/20  7:36 PM   Specimen: Stool  Result Value Ref Range Status   C Diff antigen NEGATIVE NEGATIVE Final   C Diff toxin NEGATIVE NEGATIVE Final   C Diff interpretation No C. difficile detected.  Final    Comment: Performed at [facility], [address] [place], [place] [address]  Blood Culture (routine x 2)     Status: None (Preliminary result)   Collection Time: 05/29/20  8:11 PM   Specimen: BLOOD  Result Value Ref Range Status   Specimen Description BLOOD RIGHT FA  Final   Special Requests   Final    BOTTLES DRAWN AEROBIC AND ANAEROBIC Blood Culture results may not be optimal due to an excessive volume of blood received in culture  bottles   Culture   Final    NO GROWTH < 12 HOURS Performed at [facility], [address] [place], [place] [address]    Report Status PENDING  Incomplete    Coagulation Studies: Recent Labs    05/29/20 1305  LABPROT 14.6  INR 1.2    Urinalysis: No results for input(s): COLORURINE, LABSPEC, PHURINE, GLUCOSEU, HGBUR, BILIRUBINUR, KETONESUR, PROTEINUR, UROBILINOGEN, NITRITE, LEUKOCYTESUR in the last 72 hours.  Invalid input(s): APPERANCEUR    Imaging: CT ABDOMEN PELVIS W CONTRAST  Result Date: 05/29/2020 CLINICAL DATA:  Abdominal pain and fever EXAM: CT ABDOMEN AND PELVIS WITH CONTRAST TECHNIQUE: Multidetector CT imaging of the abdomen and pelvis was performed using the standard protocol following bolus administration of intravenous contrast. CONTRAST:  142mL OMNIPAQUE IOHEXOL 300 MG/ML  SOLN COMPARISON:  December 30, 2019 FINDINGS: Lower chest: The visualized heart size within normal limits. No pericardial fluid/thickening. No hiatal hernia. Subpleural patchy airspace opacity seen at lung bases. Hepatobiliary: The liver is normal in density without focal abnormality.The main portal vein is patent. No evidence of calcified  gallstones, gallbladder wall thickening or biliary dilatation. Pancreas: Unremarkable. No pancreatic ductal dilatation or surrounding inflammatory changes. Spleen: Normal in size without focal abnormality. Adrenals/Urinary Tract: Both adrenal glands appear normal. The patient is status post bilateral nephrectomy. A left lower quadrant renal transplant is present. Mild to moderate pelvicaliectasis ureterectasis noted with mild urothelial hyperenhancement. The bladder is partially decompressed with mild diffuse bladder wall thickening. Stomach/Bowel: The stomach is normal in appearance. There is question of mesenteric edema and clumping of small bowel in the splenic flexure within the left upper quadrant with bowel wall edema. Vascular/Lymphatic: There are no enlarged mesenteric, retroperitoneal, or pelvic lymph nodes. Scattered atherosclerosis at the origin the renal arteries right iliac vasculature. Reproductive: The prostate is unremarkable. Other: A peritoneal dialysis catheter coiled within the deep pelvis. There is a small amount of abdominal ascites. Musculoskeletal: No acute or significant osseous findings. IMPRESSION: Mild bilateral patchy airspace opacities at both lung bases which could be due to atelectasis and/or early infectious etiology. Small abdominopelvic ascites. The bladder is partially decompressed with apparent thickening which could be due to cystitis versus physiologic under distension. Mild edematous appearance to the splenic flexure and small bowel loops. Findings may be infectious, inflammatory, or secondary to hypoalbuminemia. Electronically Signed   By: Prudencio Pair M.D.   On: 05/29/2020 15:31   DG Chest Port 1 View  Result Date: 05/29/2020 CLINICAL DATA:  Question sepsis.  Dialysis patient. EXAM: PORTABLE CHEST 1 VIEW COMPARISON:  01/01/2020 FINDINGS: Mild patchy peripheral infiltrates have developed since the prior study. Heart size within normal limits. Vascularity normal. No  pleural effusion. IMPRESSION: Mild patchy peripheral infiltrates bilaterally. Probable pneumonia. Correlate with COVID-19 status. Electronically Signed   By: Franchot Gallo M.D.   On: 05/29/2020 13:52     Medications:   . sodium chloride    . acetaminophen Stopped (05/30/20 0600)  . remdesivir 100 mg in NS 100 mL Stopped (05/30/20 1044)   . apixaban  2.5 mg Oral BID  . vitamin C  500 mg Oral Daily  . cinacalcet  30 mg Oral Q breakfast  . DULoxetine  30 mg Oral Daily  . gabapentin  300 mg Oral BID  . lacosamide  100 mg Oral BID  . lamoTRIgine  100 mg Oral Daily  . levETIRAcetam  1,000 mg Oral QHS  . lipase/protease/amylase  24,000 Units Oral TID AC  . methylPREDNISolone (SOLU-MEDROL) injection  10  mg Intravenous Daily  . mirtazapine  15 mg Oral QHS  . sevelamer carbonate  1,600 mg Oral TID WC  . sodium chloride flush  3 mL Intravenous Q12H  . tamsulosin  0.4 mg Oral Daily  . vitamin B-12  1,000 mcg Oral Daily  . [START ON 06/17/2020] Vitamin D (Ergocalciferol)  50,000 Units Oral Q30 days  . zinc sulfate  220 mg Oral Daily   sodium chloride, acetaminophen, albuterol, ondansetron **OR** ondansetron (ZOFRAN) IV, sodium chloride flush  Assessment/ Plan:  34 y.o. 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  [facility]  1.  ESRD on PD.  Patient significantly hypotensive upon admission.  Therefore peritoneal dialysis held.  Continues to have low appetite.  Blood pressure has improved a bit.  We will hold off on peritoneal dialysis for now.  Reevaluate tomorrow.  2.  COVID-19 pneumonia.  Patient has developed  COVID-19 despite vaccination and booster dose.  However he is considered immunosuppressed.  Agree with remdesivir and supportive care.  3.  Anemia chronic kidney disease.  Hemoglobin 9.7.  Continue to monitor hemoglobin closely.  Hold off on Epogen for now.  4.  Secondary hyperparathyroidism.  Monitor bone mineral metabolism parameters over the course of hospitalization.  Maintain the patient on Renvela 2 tablets p.o. 3 times daily with meals for now.  5.  Abdominal pain.  PD fluid cell count was 91.  This is just below the threshold for peritonitis.  In addition body fluid culture pending.  Continue to wait and body fluid culture.  Agree with hospitalist that patient most likely has Covid related gastroenteritis.   LOS: 1 Jaleal Schliep 9/16/202111:37 AM

## 2020-05-30 NOTE — Progress Notes (Signed)
PROGRESS NOTE    John Cline.  GUR:[id number] DOB: 1986/08/30 DOA: 05/29/2020 PCP: Servando Snare, MD   Brief Narrative:  John Cline. is a 34 y.o. male with medical history significant forhistory of failed kidney transplant, end-stage renal disease on peritoneal dialysis, chronic pancreatitis, atrial fibrillation on Eliquis, history of CVA, history of seizures and history of substance use presented to ED via EMS after his friend found him lethargic on the floor at home.  Patient is having diarrhea, cough, nausea and poor p.o. intake for 3 days.  Found to be hypotensive with blood pressure of 50/30.  Improved with 2 L of bolus. Patient is fully vaccinated, received a dose of booster, found to be positive for COVID-19.  GI pathogen negative.  CT abdomen with some mild bilateral patchy airspace opacities at both lung bases, small abdominopelvic ascites, and a mild edema of splenic flexure and small bowel.  Lipase was markedly elevated at 1272, EKG with atrial fibrillation.  Mildly elevated inflammatory markers.  Started on remdesivir, admitted for COVID-19 gastroenteritis and acute on chronic pancreatitis.  Subjective: Patient was sleeping when I entered the room, when I woke him up he started complaining about 10 out of 10 abdominal pain and asking for Dilaudid.  Continue to have some nausea and diarrhea.  Denies any substance abuse in the past although mentioned in his chart.  Assessment & Plan:   Principal Problem:   Gastroenteritis due to COVID-19 virus Active Problems:   Hyponatremia   Anemia in chronic renal disease   Chronic abdominal pain   Depressive disorder   Epilepsy ([place])   ESRD (end stage renal disease) ([place])   Kidney transplant failure   Atrial fibrillation ([place])   Peritoneal dialysis status ([place])   COVID-19 virus infection   Hypotension due to hypovolemia  Acute on chronic pancreatitis.  Lipase markedly elevated at 1272.  History of chronic  pancreatitis.  CT abdomen without any significant inflammation of pancreas.  Continue to experience abdominal pain.  Peritoneal fluid cell counts are not supportive of SBP.  Cultures pending.  Procalcitonin markedly elevated at 8.87. Concern of some inflammation/infection around splenic flexure.  Patient did receive a dose of cefepime and Flagyl in ED. -Start him on Zosyn for possible intra-abdominal infection. -Full liquid diet. -Give him 1 dose of Dilaudid- can continue if needed. -Add oxycodone as needed for pain. -Continue with creon.  COVID-19 infection.  Patient is fully vaccinated, even received booster.  Not much respiratory symptoms, mild cough.  Predominant GI symptoms.  Mildly elevated D-dimer with markedly elevated CRP at 15.5.  Chest x-ray with bilateral infiltrates.  Currently not needing any oxygen. -Continue remdesivir-day 2. -Continue  methylprednisolone. -Continue supportive care. -Continue supplements. -Continue to monitor inflammatory markers.   Hyponatremia.  Seems chronic and sodium staying between 125-130 most of the time.  Currently at 127. -Continue to monitor.  ESRD on peritoneal dialysis. -Nephrology was consulted-patient will continue with his routine PD.  History of failed renal transplant.  -Patient was on prednisone 10 mg daily at home. -Patient currently on Solu-Medrol-will be discharged on home dose of prednisone.  Persistent A. Fib/flutter.  Currently rate controlled.  His home dose of Coreg was held secondary to softer blood pressure. -Keep holding Coreg as blood pressure is still little soft. -Continue Eliquis.  History of seizure disorder.  No acute concern. -Continue home meds.  History of depression.  No acute concern. -Continue home dose of Remeron.  Objective: Vitals:   05/29/20  2100 05/29/20 2200 05/30/20 0021 05/30/20 0500  BP: 105/80 106/84 101/80 (!) 120/97  Pulse: 78 (!) 41 72 70  Resp: 18 14 15    Temp:      TempSrc:      SpO2:  96% 97% 99%   Weight:      Height:        Intake/Output Summary (Last 24 hours) at 05/30/2020 0751 Last data filed at 05/29/2020 1748 Gross per 24 hour  Intake 3650 ml  Output --  Net 3650 ml   Filed Weights   05/29/20 1318  Weight: 59 kg    Examination:  General exam: Appears calm and comfortable  Respiratory system: Clear to auscultation. Respiratory effort normal. Cardiovascular system: S1 & S2 heard, RRR. No JVD, murmurs, Gastrointestinal system: Soft, mild diffuse tenderness, nondistended, bowel sounds positive. Central nervous system: Alert and oriented. No focal neurological deficits. Extremities: No edema, no cyanosis, pulses intact and symmetrical. Psychiatry: Judgement and insight appear normal.   DVT prophylaxis: Eliquis Code Status: Full Family Communication: Father was updated on phone. Disposition Plan:  Status is: Inpatient  Remains inpatient appropriate because:Inpatient level of care appropriate due to severity of illness   Dispo: The patient is from: Home              Anticipated d/c is to: Home              Anticipated d/c date is: 2 days              Patient currently is not medically stable to d/c.  Consultants:   Nephrology  Procedures:  Antimicrobials:  Zosyn  Data Reviewed: I have personally reviewed following labs and imaging studies  CBC: Recent Labs  Lab 05/29/20 1305 05/30/20 0021 05/30/20 0507  WBC 5.4 2.7* 2.3*  NEUTROABS 4.1  --  1.9  HGB 10.1* 9.8* 9.7*  HCT 29.0* 28.5* 27.0*  MCV 92.9 92.2 90.0  PLT 176 149* 469   Basic Metabolic Panel: Recent Labs  Lab 05/29/20 1305 05/30/20 0507  NA 126* 127*  K 3.8 4.4  CL 86* 89*  CO2 23 21*  GLUCOSE 99 114*  BUN 91* 94*  CREATININE 13.62* 13.11*  CALCIUM 8.0* 7.9*  MG  --  2.0  PHOS  --  10.5*   GFR: Estimated Creatinine Clearance: 6.5 mL/min (A) (by C-G formula based on SCr of 13.11 mg/dL (H)). Liver Function Tests: Recent Labs  Lab 05/29/20 1305 05/30/20 0507   AST 21 26  ALT 15 14  ALKPHOS 117 107  BILITOT 0.8 0.7  PROT 6.3* 5.9*  ALBUMIN 3.1* 2.9*   Recent Labs  Lab 05/29/20 1936  LIPASE 1,272*   No results for input(s): AMMONIA in the last 168 hours. Coagulation Profile: Recent Labs  Lab 05/29/20 1305  INR 1.2   Cardiac Enzymes: No results for input(s): CKTOTAL, CKMB, CKMBINDEX, TROPONINI in the last 168 hours. BNP (last 3 results) No results for input(s): PROBNP in the last 8760 hours. HbA1C: No results for input(s): HGBA1C in the last 72 hours. CBG: No results for input(s): GLUCAP in the last 168 hours. Lipid Profile: No results for input(s): CHOL, HDL, LDLCALC, TRIG, CHOLHDL, LDLDIRECT in the last 72 hours. Thyroid Function Tests: No results for input(s): TSH, T4TOTAL, FREET4, T3FREE, THYROIDAB in the last 72 hours. Anemia Panel: Recent Labs    05/30/20 0507  FERRITIN 732*   Sepsis Labs: Recent Labs  Lab 05/29/20 1305  LATICACIDVEN 1.3    Recent Results (from the  past 240 hour(s))  Blood Culture (routine x 2)     Status: None (Preliminary result)   Collection Time: 05/29/20  1:05 PM   Specimen: BLOOD  Result Value Ref Range Status   Specimen Description BLOOD LEFT ARM  Final   Special Requests   Final    BOTTLES DRAWN AEROBIC AND ANAEROBIC Blood Culture results may not be optimal due to an excessive volume of blood received in culture bottles   Culture   Final    NO GROWTH < 24 HOURS Performed at [facility], [address] [place], [place] [address]    Report Status PENDING  Incomplete  SARS Coronavirus 2 by RT PCR (hospital order, performed in [facility] hospital lab) Nasopharyngeal Nasopharyngeal Swab     Status: Abnormal   Collection Time: 05/29/20  1:06 PM   Specimen: Nasopharyngeal Swab  Result Value Ref Range Status   SARS Coronavirus 2 POSITIVE (A) NEGATIVE Final    Comment: RESULT CALLED TO, READ BACK BY AND VERIFIED WITH: MEGAN JONES @1439  05/29/20 MJU (NOTE) SARS-CoV-2 target  nucleic acids are DETECTED  SARS-CoV-2 RNA is generally detectable in upper respiratory specimens  during the acute phase of infection.  Positive results are indicative  of the presence of the identified virus, but do not rule out bacterial infection or co-infection with other pathogens not detected by the test.  Clinical correlation with patient history and  other diagnostic information is necessary to determine patient infection status.  The expected result is negative.  Fact Sheet for Patients:   StrictlyIdeas.no   Fact Sheet for Healthcare Providers:   BankingDealers.co.za    This test is not yet approved or cleared by the [place] FDA and  has been authorized for detection and/or diagnosis of SARS-CoV-2 by FDA under an Emergency Use Authorization (EUA).  This EUA will remain in effect (meaning this test ca n be used) for the duration of  the COVID-19 declaration under Section 564(b)(1) of the Act, 21 U.S.C. section 360-bbb-3(b)(1), unless the authorization is terminated or revoked sooner.  Performed at [facility], [address] [place], [place] [address]   Gastrointestinal Panel by PCR , Stool     Status: None   Collection Time: 05/29/20  7:36 PM   Specimen: Stool  Result Value Ref Range Status   Campylobacter species NOT DETECTED NOT DETECTED Final   Plesimonas shigelloides NOT DETECTED NOT DETECTED Final   Salmonella species NOT DETECTED NOT DETECTED Final   Yersinia enterocolitica NOT DETECTED NOT DETECTED Final   Vibrio species NOT DETECTED NOT DETECTED Final   Vibrio cholerae NOT DETECTED NOT DETECTED Final   Enteroaggregative E coli (EAEC) NOT DETECTED NOT DETECTED Final   Enteropathogenic E coli (EPEC) NOT DETECTED NOT DETECTED Final   Enterotoxigenic E coli (ETEC) NOT DETECTED NOT DETECTED Final   Shiga like toxin producing E coli (STEC) NOT DETECTED NOT DETECTED Final   Shigella/Enteroinvasive E  coli (EIEC) NOT DETECTED NOT DETECTED Final   Cryptosporidium NOT DETECTED NOT DETECTED Final   Cyclospora cayetanensis NOT DETECTED NOT DETECTED Final   Entamoeba histolytica NOT DETECTED NOT DETECTED Final   Giardia lamblia NOT DETECTED NOT DETECTED Final   Adenovirus F40/41 NOT DETECTED NOT DETECTED Final   Astrovirus NOT DETECTED NOT DETECTED Final   Norovirus GI/GII NOT DETECTED NOT DETECTED Final   Rotavirus A NOT DETECTED NOT DETECTED Final   Sapovirus (I, II, IV, and V) NOT DETECTED NOT DETECTED Final    Comment: Performed at  [facility], [place], [place] [address]  C Difficile Quick Screen w PCR reflex     Status: None   Collection Time: 05/29/20  7:36 PM   Specimen: Stool  Result Value Ref Range Status   C Diff antigen NEGATIVE NEGATIVE Final   C Diff toxin NEGATIVE NEGATIVE Final   C Diff interpretation No C. difficile detected.  Final    Comment: Performed at [facility], [address] [place], [place] [address]  Blood Culture (routine x 2)     Status: None (Preliminary result)   Collection Time: 05/29/20  8:11 PM   Specimen: BLOOD  Result Value Ref Range Status   Specimen Description BLOOD RIGHT FA  Final   Special Requests   Final    BOTTLES DRAWN AEROBIC AND ANAEROBIC Blood Culture results may not be optimal due to an excessive volume of blood received in culture bottles   Culture   Final    NO GROWTH < 12 HOURS Performed at [facility], [address], [place], [place] [address]    Report Status PENDING  Incomplete     Radiology Studies: CT ABDOMEN PELVIS W CONTRAST  Result Date: 05/29/2020 CLINICAL DATA:  Abdominal pain and fever EXAM: CT ABDOMEN AND PELVIS WITH CONTRAST TECHNIQUE: Multidetector CT imaging of the abdomen and pelvis was performed using the standard protocol following bolus administration of intravenous contrast. CONTRAST:  170mL OMNIPAQUE IOHEXOL 300 MG/ML  SOLN COMPARISON:  December 30, 2019  FINDINGS: Lower chest: The visualized heart size within normal limits. No pericardial fluid/thickening. No hiatal hernia. Subpleural patchy airspace opacity seen at lung bases. Hepatobiliary: The liver is normal in density without focal abnormality.The main portal vein is patent. No evidence of calcified gallstones, gallbladder wall thickening or biliary dilatation. Pancreas: Unremarkable. No pancreatic ductal dilatation or surrounding inflammatory changes. Spleen: Normal in size without focal abnormality. Adrenals/Urinary Tract: Both adrenal glands appear normal. The patient is status post bilateral nephrectomy. A left lower quadrant renal transplant is present. Mild to moderate pelvicaliectasis ureterectasis noted with mild urothelial hyperenhancement. The bladder is partially decompressed with mild diffuse bladder wall thickening. Stomach/Bowel: The stomach is normal in appearance. There is question of mesenteric edema and clumping of small bowel in the splenic flexure within the left upper quadrant with bowel wall edema. Vascular/Lymphatic: There are no enlarged mesenteric, retroperitoneal, or pelvic lymph nodes. Scattered atherosclerosis at the origin the renal arteries right iliac vasculature. Reproductive: The prostate is unremarkable. Other: A peritoneal dialysis catheter coiled within the deep pelvis. There is a small amount of abdominal ascites. Musculoskeletal: No acute or significant osseous findings. IMPRESSION: Mild bilateral patchy airspace opacities at both lung bases which could be due to atelectasis and/or early infectious etiology. Small abdominopelvic ascites. The bladder is partially decompressed with apparent thickening which could be due to cystitis versus physiologic under distension. Mild edematous appearance to the splenic flexure and small bowel loops. Findings may be infectious, inflammatory, or secondary to hypoalbuminemia. Electronically Signed   By: Prudencio Pair M.D.   On: 05/29/2020  15:31   DG Chest Port 1 View  Result Date: 05/29/2020 CLINICAL DATA:  Question sepsis.  Dialysis patient. EXAM: PORTABLE CHEST 1 VIEW COMPARISON:  01/01/2020 FINDINGS: Mild patchy peripheral infiltrates have developed since the prior study. Heart size within normal limits. Vascularity normal. No pleural effusion. IMPRESSION: Mild patchy peripheral infiltrates bilaterally. Probable pneumonia. Correlate with COVID-19 status. Electronically Signed   By: Franchot Gallo M.D.   On: 05/29/2020  13:52    Scheduled Meds: . apixaban  2.5 mg Oral BID  . vitamin C  500 mg Oral Daily  . cinacalcet  30 mg Oral Q breakfast  . DULoxetine  30 mg Oral Daily  . gabapentin  300 mg Oral BID  . lacosamide  100 mg Oral BID  . lamoTRIgine  100 mg Oral Daily  . levETIRAcetam  1,000 mg Oral QHS  . lipase/protease/amylase  24,000 Units Oral TID AC  . methylPREDNISolone (SOLU-MEDROL) injection  10 mg Intravenous Daily  . mirtazapine  15 mg Oral QHS  . sevelamer carbonate  1,600 mg Oral TID WC  . sodium chloride flush  3 mL Intravenous Q12H  . tamsulosin  0.4 mg Oral Daily  . vitamin B-12  1,000 mcg Oral Daily  . [START ON 06/17/2020] Vitamin D (Ergocalciferol)  50,000 Units Oral Q30 days  . zinc sulfate  220 mg Oral Daily   Continuous Infusions: . sodium chloride    . acetaminophen Stopped (05/30/20 0600)  . remdesivir 100 mg in NS 100 mL       LOS: 1 day   Time spent: 35 minutes.  Lorella Nimrod, MD [facility]  If 7PM-7AM, please contact night-coverage [email or web address]  05/30/2020, 7:51 AM   This record has been created using Systems analyst. Errors have been sought and corrected,but may not always be located. Such creation errors do not reflect on the standard of care.

## 2020-05-30 NOTE — ED Notes (Signed)
Pt placed in hospital bed with no issue. Pt inquiring about pain medication. Pt reminded that is is not time for pain meds. No further needs expressed.

## 2020-05-30 NOTE — Consult Note (Signed)
Pharmacy Antibiotic Note  John Cline. is a 34 y.o. male with medical history including ESRD on PD, history of failed kidney transplant and BK nephropathy, chronic pancreatitis, chronic diarrhea admitted on 05/29/2020 with intra-abdominal infection / COVID-19 gastroenteritis. Pharmacy has been consulted for Zosyn dosing for suspected intra-abdominal infection.  Labs with leukopenia, CRP 15.5, PCT 8.87  Plan: Zosyn 2.25 g IV q8h  Nephrology following and PD is on hold for now.    Height: 5\' 11"  (180.3 cm) Weight: 59 kg (130 lb) IBW/kg (Calculated) : 75.3  Temp (24hrs), Avg:99.1 F (37.3 C), Min:99.1 F (37.3 C), Max:99.1 F (37.3 C)  Recent Labs  Lab 05/29/20 1305 05/30/20 0021 05/30/20 0507  WBC 5.4 2.7* 2.3*  CREATININE 13.62*  --  13.11*  LATICACIDVEN 1.3  --   --     Estimated Creatinine Clearance: 6.5 mL/min (A) (by C-G formula based on SCr of 13.11 mg/dL (H)).    Allergies  Allergen Reactions  . Dapsone Other (See Comments)    Methemoglobinemia    Other reaction(s): Other (See Comments) Childhood allergies methemoglobinemia Other reaction(s): Unknown   . Morphine Hives and Rash    Per patient, this is just redness and itching at injection site. Confusion Other reaction(s): Unknown   . Lipase Other (See Comments)    Constipation  Other reaction(s): Other (See Comments) Constipation Other reaction(s): Unknown  . Morphine And Related Rash    Antimicrobials this admission: Remdesivir 9/15 >>  Zosyn 9/16 >>   Dose adjustments this admission: n/a  Microbiology results: 9/16 Peritoneal fluid: pending 9/15 BCx: NGTD 9/15 GI panel: negative 9/15 C diff: negative 9/15 SARS-CoV-2 NAAT: positive  Thank you for allowing pharmacy to be a part of this patient's care.  Benita Gutter 05/30/2020 1:14 PM

## 2020-05-30 NOTE — ED Notes (Signed)
req for additional pain medications

## 2020-05-31 LAB — CBC WITH DIFFERENTIAL/PLATELET
Abs Immature Granulocytes: 0.02 10*3/uL (ref 0.00–0.07)
Basophils Absolute: 0 10*3/uL (ref 0.0–0.1)
Basophils Relative: 0 %
Eosinophils Absolute: 0 10*3/uL (ref 0.0–0.5)
Eosinophils Relative: 0 %
HCT: 25.9 % — ABNORMAL LOW (ref 39.0–52.0)
Hemoglobin: 9.5 g/dL — ABNORMAL LOW (ref 13.0–17.0)
Immature Granulocytes: 0 %
Lymphocytes Relative: 6 %
Lymphs Abs: 0.4 10*3/uL — ABNORMAL LOW (ref 0.7–4.0)
MCH: 32.6 pg (ref 26.0–34.0)
MCHC: 36.7 g/dL — ABNORMAL HIGH (ref 30.0–36.0)
MCV: 89 fL (ref 80.0–100.0)
Monocytes Absolute: 0.4 10*3/uL (ref 0.1–1.0)
Monocytes Relative: 7 %
Neutro Abs: 5.4 10*3/uL (ref 1.7–7.7)
Neutrophils Relative %: 87 %
Platelets: 163 10*3/uL (ref 150–400)
RBC: 2.91 MIL/uL — ABNORMAL LOW (ref 4.22–5.81)
RDW: 13.9 % (ref 11.5–15.5)
WBC: 6.2 10*3/uL (ref 4.0–10.5)
nRBC: 0 % (ref 0.0–0.2)

## 2020-05-31 LAB — COMPREHENSIVE METABOLIC PANEL
ALT: 14 U/L (ref 0–44)
AST: 29 U/L (ref 15–41)
Albumin: 3 g/dL — ABNORMAL LOW (ref 3.5–5.0)
Alkaline Phosphatase: 90 U/L (ref 38–126)
Anion gap: 19 — ABNORMAL HIGH (ref 5–15)
BUN: 107 mg/dL — ABNORMAL HIGH (ref 6–20)
CO2: 19 mmol/L — ABNORMAL LOW (ref 22–32)
Calcium: 7.2 mg/dL — ABNORMAL LOW (ref 8.9–10.3)
Chloride: 88 mmol/L — ABNORMAL LOW (ref 98–111)
Creatinine, Ser: 12.88 mg/dL — ABNORMAL HIGH (ref 0.61–1.24)
GFR calc Af Amer: 5 mL/min — ABNORMAL LOW (ref >60)
GFR calc non Af Amer: 4 mL/min — ABNORMAL LOW (ref >60)
Glucose, Bld: 104 mg/dL — ABNORMAL HIGH (ref 70–99)
Potassium: 4.3 mmol/L (ref 3.5–5.1)
Sodium: 126 mmol/L — ABNORMAL LOW (ref 135–145)
Total Bilirubin: 0.6 mg/dL (ref 0.3–1.2)
Total Protein: 5.7 g/dL — ABNORMAL LOW (ref 6.5–8.1)

## 2020-05-31 LAB — C-REACTIVE PROTEIN: CRP: 11.5 mg/dL — ABNORMAL HIGH (ref <1.0)

## 2020-05-31 LAB — MAGNESIUM: Magnesium: 2 mg/dL (ref 1.7–2.4)

## 2020-05-31 LAB — PHOSPHORUS: Phosphorus: 11.9 mg/dL — ABNORMAL HIGH (ref 2.5–4.6)

## 2020-05-31 LAB — FIBRIN DERIVATIVES D-DIMER (ARMC ONLY): Fibrin derivatives D-dimer (ARMC): 749.09 ng/mL (FEU) — ABNORMAL HIGH (ref 0.00–499.00)

## 2020-05-31 LAB — LIPASE, BLOOD: Lipase: 1159 U/L — ABNORMAL HIGH (ref 11–51)

## 2020-05-31 LAB — PROCALCITONIN: Procalcitonin: 9.21 ng/mL

## 2020-05-31 LAB — FERRITIN: Ferritin: 922 ng/mL — ABNORMAL HIGH (ref 24–336)

## 2020-05-31 MED ORDER — OXYCODONE HCL 5 MG PO TABS
5.0000 mg | ORAL_TABLET | ORAL | Status: DC | PRN
  Administered 2020-05-31 – 2020-06-01 (×3): 5 mg via ORAL
  Filled 2020-05-31 (×3): qty 1

## 2020-05-31 MED ORDER — DILTIAZEM HCL ER COATED BEADS 120 MG PO CP24
120.0000 mg | ORAL_CAPSULE | Freq: Every day | ORAL | Status: DC
  Administered 2020-05-31: 22:00:00 120 mg via ORAL
  Filled 2020-05-31 (×4): qty 1

## 2020-05-31 MED ORDER — DELFLEX-LC/1.5% DEXTROSE 344 MOSM/L IP SOLN
INTRAPERITONEAL | Status: DC
  Filled 2020-05-31: qty 3000

## 2020-05-31 MED ORDER — SODIUM CHLORIDE 0.9 % IV SOLN: INTRAVENOUS | Status: AC

## 2020-05-31 MED ORDER — MORPHINE SULFATE (PF) 2 MG/ML IV SOLN
1.0000 mg | Freq: Once | INTRAVENOUS | Status: AC
  Administered 2020-06-01: 1 mg via INTRAVENOUS
  Filled 2020-05-31: qty 1

## 2020-05-31 MED ORDER — DIPHENHYDRAMINE HCL 50 MG/ML IJ SOLN
12.5000 mg | Freq: Four times a day (QID) | INTRAMUSCULAR | Status: DC | PRN
  Administered 2020-06-01: 04:00:00 12.5 mg via INTRAVENOUS
  Filled 2020-05-31: qty 1

## 2020-05-31 MED ORDER — GENTAMICIN SULFATE 0.1 % EX CREA
1.0000 "application " | TOPICAL_CREAM | Freq: Every day | CUTANEOUS | Status: DC
  Filled 2020-05-31: qty 15

## 2020-05-31 NOTE — Progress Notes (Signed)
[facility]  ROUNDING NOTE   Subjective:  Patient seen at bedside. Still complaining of abdominal pain. PD fluid cultures thus far negative.  Objective:  Vital signs in last 24 hours:  Temp:  [97.7 F (36.5 C)-98.2 F (36.8 C)] 97.7 F (36.5 C) (09/17 0813) Pulse Rate:  [71-88] 78 (09/17 0429) Resp:  [11-18] 12 (09/17 0813) BP: (121-143)/(90-105) 139/94 (09/17 0813) SpO2:  [97 %-100 %] 100 % (09/17 0813)  Weight change:  Filed Weights   05/29/20 1318  Weight: 59 kg    Intake/Output: I/O last 3 completed shifts: In: 193 [IV Piggyback:193] Out: 350 [Urine:350]   Intake/Output this shift:  Total I/O In: 360 [P.O.:360] Out: 100 [Urine:100]  Physical Exam: General:  No acute distress  Head:  Normocephalic, atraumatic. Moist oral mucosal membranes  Eyes:  Anicteric  Neck:  Supple  Lungs:   Normal respiratory effort  Heart:  Regular  Abdomen:   Soft, mild diffuse tenderness  Extremities:  No peripheral edema.  Neurologic:  Awake, alert, following commands  Skin:  No lesions  Access:  PD catheter in place    Basic Metabolic Panel: Recent Labs  Lab 05/29/20 1305 05/29/20 1305 05/30/20 0507 05/30/20 1818 05/31/20 0615  NA 126*  --  127* 126* 126*  K 3.8  --  4.4 4.5 4.3  CL 86*  --  89* 90* 88*  CO2 23  --  21* 20* 19*  GLUCOSE 99  --  114* 121* 104*  BUN 91*  --  94* 101* 107*  CREATININE 13.62*  --  13.11* 12.61* 12.88*  CALCIUM 8.0*   < > 7.9* 7.1* 7.2*  MG  --   --  2.0  --  2.0  PHOS  --   --  10.5*  --  11.9*   < > = values in this interval not displayed.    Liver Function Tests: Recent Labs  Lab 05/29/20 1305 05/30/20 0507 05/31/20 0615  AST 21 26 29   ALT 15 14 14   ALKPHOS 117 107 90  BILITOT 0.8 0.7 0.6  PROT 6.3* 5.9* 5.7*  ALBUMIN 3.1* 2.9* 3.0*   Recent Labs  Lab 05/29/20 1936 05/31/20 0615  LIPASE 1,272* 1,159*   No results for input(s): AMMONIA in the last 168 hours.  CBC: Recent Labs  Lab 05/29/20 1305  05/30/20 0021 05/30/20 0507 05/31/20 0615  WBC 5.4 2.7* 2.3* 6.2  NEUTROABS 4.1  --  1.9 5.4  HGB 10.1* 9.8* 9.7* 9.5*  HCT 29.0* 28.5* 27.0* 25.9*  MCV 92.9 92.2 90.0 89.0  PLT 176 149* 160 163    Cardiac Enzymes: No results for input(s): CKTOTAL, CKMB, CKMBINDEX, TROPONINI in the last 168 hours.  BNP: Invalid input(s): POCBNP  CBG: No results for input(s): GLUCAP in the last 168 hours.  Microbiology: Results for orders placed or performed during the hospital encounter of 05/29/20  Blood Culture (routine x 2)     Status: None (Preliminary result)   Collection Time: 05/29/20  1:05 PM   Specimen: BLOOD  Result Value Ref Range Status   Specimen Description BLOOD LEFT ARM  Final   Special Requests   Final    BOTTLES DRAWN AEROBIC AND ANAEROBIC Blood Culture results may not be optimal due to an excessive volume of blood received in culture bottles   Culture   Final    NO GROWTH 2 DAYS Performed at [facility], [address] [place], [place] [address]    Report Status PENDING  Incomplete  SARS Coronavirus 2 by RT PCR (hospital order, performed in [facility] hospital lab) Nasopharyngeal Nasopharyngeal Swab     Status: Abnormal   Collection Time: 05/29/20  1:06 PM   Specimen: Nasopharyngeal Swab  Result Value Ref Range Status   SARS Coronavirus 2 POSITIVE (A) NEGATIVE Final    Comment: RESULT CALLED TO, READ BACK BY AND VERIFIED WITH: MEGAN JONES @1439  05/29/20 MJU (NOTE) SARS-CoV-2 target nucleic acids are DETECTED  SARS-CoV-2 RNA is generally detectable in upper respiratory specimens  during the acute phase of infection.  Positive results are indicative  of the presence of the identified virus, but do not rule out bacterial infection or co-infection with other pathogens not detected by the test.  Clinical correlation with patient history and  other diagnostic information is necessary to determine patient infection status.  The expected result is  negative.  Fact Sheet for Patients:   StrictlyIdeas.no   Fact Sheet for Healthcare Providers:   BankingDealers.co.za    This test is not yet approved or cleared by the [place] FDA and  has been authorized for detection and/or diagnosis of SARS-CoV-2 by FDA under an Emergency Use Authorization (EUA).  This EUA will remain in effect (meaning this test ca n be used) for the duration of  the COVID-19 declaration under Section 564(b)(1) of the Act, 21 U.S.C. section 360-bbb-3(b)(1), unless the authorization is terminated or revoked sooner.  Performed at [facility], [address] [place], [place] [address]   Gastrointestinal Panel by PCR , Stool     Status: None   Collection Time: 05/29/20  7:36 PM   Specimen: Stool  Result Value Ref Range Status   Campylobacter species NOT DETECTED NOT DETECTED Final   Plesimonas shigelloides NOT DETECTED NOT DETECTED Final   Salmonella species NOT DETECTED NOT DETECTED Final   Yersinia enterocolitica NOT DETECTED NOT DETECTED Final   Vibrio species NOT DETECTED NOT DETECTED Final   Vibrio cholerae NOT DETECTED NOT DETECTED Final   Enteroaggregative E coli (EAEC) NOT DETECTED NOT DETECTED Final   Enteropathogenic E coli (EPEC) NOT DETECTED NOT DETECTED Final   Enterotoxigenic E coli (ETEC) NOT DETECTED NOT DETECTED Final   Shiga like toxin producing E coli (STEC) NOT DETECTED NOT DETECTED Final   Shigella/Enteroinvasive E coli (EIEC) NOT DETECTED NOT DETECTED Final   Cryptosporidium NOT DETECTED NOT DETECTED Final   Cyclospora cayetanensis NOT DETECTED NOT DETECTED Final   Entamoeba histolytica NOT DETECTED NOT DETECTED Final   Giardia lamblia NOT DETECTED NOT DETECTED Final   Adenovirus F40/41 NOT DETECTED NOT DETECTED Final   Astrovirus NOT DETECTED NOT DETECTED Final   Norovirus GI/GII NOT DETECTED NOT DETECTED Final   Rotavirus A NOT DETECTED NOT DETECTED Final   Sapovirus (I,  II, IV, and V) NOT DETECTED NOT DETECTED Final    Comment: Performed at [facility], [address] [place], [place] [address]  C Difficile Quick Screen w PCR reflex     Status: None   Collection Time: 05/29/20  7:36 PM   Specimen: Stool  Result Value Ref Range Status   C Diff antigen NEGATIVE NEGATIVE Final   C Diff toxin NEGATIVE NEGATIVE Final   C Diff interpretation No C. difficile detected.  Final    Comment: Performed at [facility], [address] [place], [place] [address]  Blood Culture (routine x 2)     Status: None (Preliminary result)   Collection Time: 05/29/20  8:11 PM   Specimen:  BLOOD  Result Value Ref Range Status   Specimen Description BLOOD RIGHT FA  Final   Special Requests   Final    BOTTLES DRAWN AEROBIC AND ANAEROBIC Blood Culture results may not be optimal due to an excessive volume of blood received in culture bottles   Culture   Final    NO GROWTH 2 DAYS Performed at [facility], [address], [place], [place] [address]    Report Status PENDING  Incomplete  Body fluid culture     Status: None (Preliminary result)   Collection Time: 05/30/20 12:21 AM   Specimen: Peritoneal Washings; Body Fluid  Result Value Ref Range Status   Specimen Description   Final    PERITONEAL Performed at [facility], [address], [place], [place] [address]    Special Requests   Final    NONE Performed at [facility], [place], [place] [address]    Gram Stain NO WBC SEEN NO ORGANISMS SEEN   Final   Culture   Final    NO GROWTH 1 DAY Performed at [facility], [place] [address] [place], [place] [address]    Report Status PENDING  Incomplete    Coagulation Studies: Recent Labs    05/29/20 1305  LABPROT 14.6  INR 1.2    Urinalysis: No results for input(s): COLORURINE, LABSPEC, PHURINE, GLUCOSEU, HGBUR, BILIRUBINUR, KETONESUR, PROTEINUR, UROBILINOGEN, NITRITE, LEUKOCYTESUR in the last 72  hours.  Invalid input(s): APPERANCEUR    Imaging: No results found.   Medications:   . sodium chloride    . sodium chloride 50 mL/hr at 05/31/20 1157  . remdesivir 100 mg in NS 100 mL 100 mg (05/31/20 1200)   . apixaban  2.5 mg Oral BID  . vitamin C  500 mg Oral Daily  . cinacalcet  30 mg Oral Q breakfast  . DULoxetine  30 mg Oral Daily  . gabapentin  300 mg Oral Daily  . influenza vac split quadrivalent PF  0.5 mL Intramuscular Tomorrow-1000  . lacosamide  100 mg Oral BID  . lamoTRIgine  100 mg Oral Daily  . levETIRAcetam  1,000 mg Oral QHS  . lipase/protease/amylase  24,000 Units Oral TID AC  . methylPREDNISolone (SOLU-MEDROL) injection  40 mg Intravenous Daily  . mirtazapine  15 mg Oral QHS  . sevelamer carbonate  1,600 mg Oral TID WC  . sodium chloride flush  3 mL Intravenous Q12H  . tamsulosin  0.4 mg Oral Daily  . vitamin B-12  1,000 mcg Oral Daily  . [START ON 06/17/2020] Vitamin D (Ergocalciferol)  50,000 Units Oral Q30 days  . zinc sulfate  220 mg Oral Daily   sodium chloride, acetaminophen, albuterol, ondansetron **OR** ondansetron (ZOFRAN) IV, oxyCODONE, sodium chloride flush  Assessment/ Plan:  34 y.o. 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  [facility]  1.  ESRD on PD.  The patient's hypotension is significantly improved.  We will plan for PD tonight.  2.  COVID-19 pneumonia.  Patient has developed COVID-19 despite vaccination and booster dose.  However he is considered immunosuppressed.  Patient maintained on remdesivir..  3.  Anemia chronic kidney disease.  Hemoglobin close to  target at  9.5.  Continue to monitor CBC.  4.  Secondary hyperparathyroidism.  Phosphorus quite high at 11.9.  Reinitiate dialysis.  Otherwise maintain the patient on Renvela.  5.  Abdominal pain.  PD fluid cell count was 91.  This is just below the threshold for peritonitis.  A predominance of lymphocytes noted within the differential.  However peritoneal fluid cultures thus far negative.  Fluid was also found to be clear.   LOS: 2 Montine Hight 9/17/20214:05 PM

## 2020-05-31 NOTE — Progress Notes (Signed)
PROGRESS NOTE    John Cline.  TKW:[id number] DOB: 10-Dec-1985 DOA: 05/29/2020 PCP: Servando Snare, MD   Brief Narrative:  John Sanfilippo. is a 34 y.o. Caucasian male with medical history significant forhistory of failed kidney transplant, end-stage renal disease on peritoneal dialysis, chronic pancreatitis, atrial fibrillation on Eliquis, history of CVA, history of seizures and history of substance use presented to ED via EMS after his friend found him lethargic on the floor at home.  Patient is having diarrhea, cough, nausea and poor p.o. intake for 3 days.  Found to be hypotensive with blood pressure of 50/30.  Improved with 2 L of bolus. Patient is fully vaccinated, received a dose of booster, found to be positive for COVID-19.  GI pathogen negative.  CT abdomen with some mild bilateral patchy airspace opacities at both lung bases, small abdominopelvic ascites, and a mild edema of splenic flexure and small bowel.  Lipase was markedly elevated at 1272, EKG with atrial fibrillation.  Mildly elevated inflammatory markers.  Started on remdesivir, admitted for COVID-19 gastroenteritis and acute on chronic pancreatitis.  Subjective: Pt complained of abdominal pain and insisting on IV pain meds.  When request for IV dilaudid was declined, pt cussed at me.   Assessment & Plan:   Principal Problem:   Gastroenteritis due to COVID-19 virus Active Problems:   Hyponatremia   Anemia in chronic renal disease   Chronic abdominal pain   Depressive disorder   Epilepsy ([place])   ESRD (end stage renal disease) ([place])   Kidney transplant failure   Atrial fibrillation ([facility])   Acute on chronic pancreatitis ([place])   Peritoneal dialysis status ([place])   COVID-19 virus infection   Hypotension due to hypovolemia  Acute on chronic pancreatitis.   Lipase markedly elevated at 1272.  History of chronic pancreatitis.  CT abdomen without any significant inflammation of pancreas.  Continue to  experience abdominal pain.  Peritoneal fluid cell counts are not supportive of SBP.  Cultures pending.  Procalcitonin markedly elevated at 8.87. Concern of some inflammation/infection around splenic flexure.  Patient did receive a dose of cefepime and Flagyl in ED and then started on Zosyn on admission. --received 3L bolus on presentation. PLAN: --resume MIVF@50  ml/hr (gentle hydration due to ESRD) --d/c zosyn since no signs of infection --continue full liquid diet --trend lipase --d/c IV dilaudid --increase frequency of oxycodone to q4h PRN -Continue with creon.  COVID-19 infection.   Patient is fully vaccinated, even received booster.  Not much respiratory symptoms, mild cough.  Predominant GI symptoms.  Mildly elevated D-dimer with markedly elevated CRP at 15.5.  Chest x-ray with bilateral infiltrates.  Currently not needing any oxygen. PLAN: -Continue remdesivir -Continue solumedrol 40 mg daily -Continue supplements. -Continue to monitor inflammatory markers.   Chronic Hyponatremia.   Seems chronic and sodium staying between 125-130 most of the time.  Currently at 127. -Continue to monitor.  ESRD on peritoneal dialysis. -Nephrology was consulted --inpatient PD per nephrology  Hyperphos --due to ESRD.  Pt has intermittently refused his phos-binder --continue Renvela  History of failed renal transplant.  -Patient was on prednisone 10 mg daily at home. -Patient currently on Solu-Medrol-will be discharged on home dose of prednisone.  Persistent A. Fib/flutter.  Currently rate controlled.  His home Coreg (37.5 mg BID?) and cardizem were held secondary to softer blood pressure. PLAN: --resume home Cardizem --continue to hold home coreg  -Continue Eliquis.  History of seizure disorder.  No acute concern. -continue home vimpat,  lamictal and keppra  History of depression.  No acute concern. -continue home Cymbalta and remeron   Objective: Vitals:   05/30/20 2008 05/31/20  0036 05/31/20 0429 05/31/20 0813  BP: (!) 121/92 (!) 134/105 (!) 143/100 (!) 139/94  Pulse: 75 71 78   Resp: 17 17 18 12   Temp: 98.2 F (36.8 C) 98 F (36.7 C) 98.1 F (36.7 C) 97.7 F (36.5 C)  TempSrc: Oral     SpO2: 97% 100% 100% 100%  Weight:      Height:        Intake/Output Summary (Last 24 hours) at 05/31/2020 1531 Last data filed at 05/31/2020 1527 Gross per 24 hour  Intake 503.04 ml  Output 450 ml  Net 53.04 ml   Filed Weights   05/29/20 1318  Weight: 59 kg    Examination:  Constitutional: NAD, AAOx3 HEENT: conjunctivae and lids normal, EOMI CV: No cyanosis.   RESP: normal respiratory effort, on RA Neuro: II - XII grossly intact.     DVT prophylaxis: TU:UEKCMKL Code Status: Full code  Family Communication:  Status is: inpatient Dispo:   The patient is from: home Anticipated d/c is to: home Anticipated d/c date is: 2-3 days Patient currently is not medically stable to d/c due to: lipase still >1000, need to be on gentle MIVF, still on clear liquid diet.   Consultants:   Nephrology  Procedures:  Antimicrobials:  Zosyn  Data Reviewed: I have personally reviewed following labs and imaging studies  CBC: Recent Labs  Lab 05/29/20 1305 05/30/20 0021 05/30/20 0507 05/31/20 0615  WBC 5.4 2.7* 2.3* 6.2  NEUTROABS 4.1  --  1.9 5.4  HGB 10.1* 9.8* 9.7* 9.5*  HCT 29.0* 28.5* 27.0* 25.9*  MCV 92.9 92.2 90.0 89.0  PLT 176 149* 160 491   Basic Metabolic Panel: Recent Labs  Lab 05/29/20 1305 05/30/20 0507 05/30/20 1818 05/31/20 0615  NA 126* 127* 126* 126*  K 3.8 4.4 4.5 4.3  CL 86* 89* 90* 88*  CO2 23 21* 20* 19*  GLUCOSE 99 114* 121* 104*  BUN 91* 94* 101* 107*  CREATININE 13.62* 13.11* 12.61* 12.88*  CALCIUM 8.0* 7.9* 7.1* 7.2*  MG  --  2.0  --  2.0  PHOS  --  10.5*  --  11.9*   GFR: Estimated Creatinine Clearance: 6.6 mL/min (A) (by C-G formula based on SCr of 12.88 mg/dL (H)). Liver Function Tests: Recent Labs  Lab 05/29/20 1305  05/30/20 0507 05/31/20 0615  AST 21 26 29   ALT 15 14 14   ALKPHOS 117 107 90  BILITOT 0.8 0.7 0.6  PROT 6.3* 5.9* 5.7*  ALBUMIN 3.1* 2.9* 3.0*   Recent Labs  Lab 05/29/20 1936 05/31/20 0615  LIPASE 1,272* 1,159*   No results for input(s): AMMONIA in the last 168 hours. Coagulation Profile: Recent Labs  Lab 05/29/20 1305  INR 1.2   Cardiac Enzymes: No results for input(s): CKTOTAL, CKMB, CKMBINDEX, TROPONINI in the last 168 hours. BNP (last 3 results) No results for input(s): PROBNP in the last 8760 hours. HbA1C: No results for input(s): HGBA1C in the last 72 hours. CBG: No results for input(s): GLUCAP in the last 168 hours. Lipid Profile: No results for input(s): CHOL, HDL, LDLCALC, TRIG, CHOLHDL, LDLDIRECT in the last 72 hours. Thyroid Function Tests: No results for input(s): TSH, T4TOTAL, FREET4, T3FREE, THYROIDAB in the last 72 hours. Anemia Panel: Recent Labs    05/30/20 0507 05/31/20 0615  FERRITIN 732* 922*   Sepsis Labs: Recent  Labs  Lab 05/29/20 1305 05/30/20 0507 05/31/20 0615  PROCALCITON  --  8.87 9.21  LATICACIDVEN 1.3  --   --     Recent Results (from the past 240 hour(s))  Blood Culture (routine x 2)     Status: None (Preliminary result)   Collection Time: 05/29/20  1:05 PM   Specimen: BLOOD  Result Value Ref Range Status   Specimen Description BLOOD LEFT ARM  Final   Special Requests   Final    BOTTLES DRAWN AEROBIC AND ANAEROBIC Blood Culture results may not be optimal due to an excessive volume of blood received in culture bottles   Culture   Final    NO GROWTH 2 DAYS Performed at [facility], [address], [place], [place] [address]    Report Status PENDING  Incomplete  SARS Coronavirus 2 by RT PCR (hospital order, performed in [facility] hospital lab) Nasopharyngeal Nasopharyngeal Swab     Status: Abnormal   Collection Time: 05/29/20  1:06 PM   Specimen: Nasopharyngeal Swab  Result Value Ref Range Status   SARS  Coronavirus 2 POSITIVE (A) NEGATIVE Final    Comment: RESULT CALLED TO, READ BACK BY AND VERIFIED WITH: MEGAN JONES @1439  05/29/20 MJU (NOTE) SARS-CoV-2 target nucleic acids are DETECTED  SARS-CoV-2 RNA is generally detectable in upper respiratory specimens  during the acute phase of infection.  Positive results are indicative  of the presence of the identified virus, but do not rule out bacterial infection or co-infection with other pathogens not detected by the test.  Clinical correlation with patient history and  other diagnostic information is necessary to determine patient infection status.  The expected result is negative.  Fact Sheet for Patients:   StrictlyIdeas.no   Fact Sheet for Healthcare Providers:   BankingDealers.co.za    This test is not yet approved or cleared by the [place] FDA and  has been authorized for detection and/or diagnosis of SARS-CoV-2 by FDA under an Emergency Use Authorization (EUA).  This EUA will remain in effect (meaning this test ca n be used) for the duration of  the COVID-19 declaration under Section 564(b)(1) of the Act, 21 U.S.C. section 360-bbb-3(b)(1), unless the authorization is terminated or revoked sooner.  Performed at [facility], [address] [place], [place] [address]   Gastrointestinal Panel by PCR , Stool     Status: None   Collection Time: 05/29/20  7:36 PM   Specimen: Stool  Result Value Ref Range Status   Campylobacter species NOT DETECTED NOT DETECTED Final   Plesimonas shigelloides NOT DETECTED NOT DETECTED Final   Salmonella species NOT DETECTED NOT DETECTED Final   Yersinia enterocolitica NOT DETECTED NOT DETECTED Final   Vibrio species NOT DETECTED NOT DETECTED Final   Vibrio cholerae NOT DETECTED NOT DETECTED Final   Enteroaggregative E coli (EAEC) NOT DETECTED NOT DETECTED Final   Enteropathogenic E coli (EPEC) NOT DETECTED NOT DETECTED Final    Enterotoxigenic E coli (ETEC) NOT DETECTED NOT DETECTED Final   Shiga like toxin producing E coli (STEC) NOT DETECTED NOT DETECTED Final   Shigella/Enteroinvasive E coli (EIEC) NOT DETECTED NOT DETECTED Final   Cryptosporidium NOT DETECTED NOT DETECTED Final   Cyclospora cayetanensis NOT DETECTED NOT DETECTED Final   Entamoeba histolytica NOT DETECTED NOT DETECTED Final   Giardia lamblia NOT DETECTED NOT DETECTED Final   Adenovirus F40/41 NOT DETECTED NOT DETECTED Final   Astrovirus NOT DETECTED NOT DETECTED Final   Norovirus GI/GII NOT DETECTED  NOT DETECTED Final   Rotavirus A NOT DETECTED NOT DETECTED Final   Sapovirus (I, II, IV, and V) NOT DETECTED NOT DETECTED Final    Comment: Performed at [facility], [place], [place] [address]  C Difficile Quick Screen w PCR reflex     Status: None   Collection Time: 05/29/20  7:36 PM   Specimen: Stool  Result Value Ref Range Status   C Diff antigen NEGATIVE NEGATIVE Final   C Diff toxin NEGATIVE NEGATIVE Final   C Diff interpretation No C. difficile detected.  Final    Comment: Performed at [facility], [address] [place], [place] [address]  Blood Culture (routine x 2)     Status: None (Preliminary result)   Collection Time: 05/29/20  8:11 PM   Specimen: BLOOD  Result Value Ref Range Status   Specimen Description BLOOD RIGHT FA  Final   Special Requests   Final    BOTTLES DRAWN AEROBIC AND ANAEROBIC Blood Culture results may not be optimal due to an excessive volume of blood received in culture bottles   Culture   Final    NO GROWTH 2 DAYS Performed at [facility], [address], [place], [place] [address]    Report Status PENDING  Incomplete  Body fluid culture     Status: None (Preliminary result)   Collection Time: 05/30/20 12:21 AM   Specimen: Peritoneal Washings; Body Fluid  Result Value Ref Range Status   Specimen Description   Final    PERITONEAL Performed at [facility], [address] [place], [place] [address]    Special Requests   Final    NONE Performed at [facility], [place], [place] [address]    Gram Stain NO WBC SEEN NO ORGANISMS SEEN   Final   Culture   Final    NO GROWTH 1 DAY Performed at [facility], [place] [address] [place], [place] [address]    Report Status PENDING  Incomplete     Radiology Studies: No results found.  Scheduled Meds: . apixaban  2.5 mg Oral BID  . vitamin C  500 mg Oral Daily  . cinacalcet  30 mg Oral Q breakfast  . DULoxetine  30 mg Oral Daily  . gabapentin  300 mg Oral Daily  . influenza vac split quadrivalent PF  0.5 mL Intramuscular Tomorrow-1000  . lacosamide  100 mg Oral BID  . lamoTRIgine  100 mg Oral Daily  . levETIRAcetam  1,000 mg Oral QHS  . lipase/protease/amylase  24,000 Units Oral TID AC  . methylPREDNISolone (SOLU-MEDROL) injection  40 mg Intravenous Daily  . mirtazapine  15 mg Oral QHS  . sevelamer carbonate  1,600 mg Oral TID WC  . sodium chloride flush  3 mL Intravenous Q12H  . tamsulosin  0.4 mg Oral Daily  . vitamin B-12  1,000 mcg Oral Daily  . [START ON 06/17/2020] Vitamin D (Ergocalciferol)  50,000 Units Oral Q30 days  . zinc sulfate  220 mg Oral Daily   Continuous Infusions: . sodium chloride    . sodium chloride 50 mL/hr at 05/31/20 1157  . remdesivir 100 mg in NS 100 mL 100 mg (05/31/20 1200)     LOS: 2 days    Enzo Bi, MD [facility]  If 7PM-7AM, please contact night-coverage [email or web address]  05/31/2020, 3:31 PM

## 2020-05-31 NOTE — Care Management Important Message (Signed)
Important Message  Patient Details  Name: John Cline. MRN: [id number] Date of Birth: April 20, 1986   Medicare Important Message Given:  Yes  Initial Medicare IM given by Patient Access Associate on 05/30/2020 at 11:12am.     Dannette Barbara 05/31/2020, 3:16 PM

## 2020-06-01 NOTE — Progress Notes (Signed)
Cross Cover Informed from nursing patient was not satisfied with care and left against medical advice. Per [facility] patient was alert, oriented and without impaired judgement

## 2020-06-01 NOTE — Progress Notes (Signed)
Patient A/Ox4 can verbally respond with clear speech, no hearing issues, and Continent of B/B. Skin warm,dry, and intact with redness and fine bumps scattered all over face. Void x3 during shift in urinal. LMB 05-31-20. Patient tolerated all schedule po medication well, no adverse, or side effects noted or reported. Patient was medicated x1 with prn Oxycodone 5mg  at 20:40 and 3:27am for c/o of acute abdominal pain rated 9/10 at all times. Upon assessment nurse observed blood on bed and pad, asked patient if she could assess where blood was coming from and how severe the area was bleeding. Patient refused to allow nurse to assess rectum, patient told nurse it was coming from rectum and nurse asked patient if he had a hemorrhoids because previous nurse had reported that. Patient denied hemorrhoids and said it didn't matter and refused to allow nurse to assess area.  Patient was asleep when nurse enter room to administer Cardizem 120mg  PO and reported to nurse when he was awaken, "My pain is still 9/10 and can I ask the doctor for something stronger to take for pain because oxycodone wasn't working". Nurse Probation officer, told patient she would notifiy hospitalitis about his c/o and concerns.

## 2020-06-01 NOTE — Progress Notes (Signed)
Charge Nurse went to educate patient, patient chang mind, and wanted the morphine. Patient was educated that he had to wait because he just was administered oxycodone 5mg   3:27 am and nurse couldn't morphine at that given time. Patient continue to put the light on asking for morphine. Nurse writer entered room and patient was standing up at sink. Nurse ask patient if he need assistance because dialysis machine was hooked up to patient, patient denied any assitance needed. At that time nurse administered morphine 2mg  at 4:11am and benadryl at 4:11am, advise patient that she was going to break after she administered his medication and that she knew he was ok. Patient begin to fabricate stories that no staff has been in room for over 15hrs, it wasn't safe to live cardiac monitoring, IV, and peritoneal dialysis tubing for treatment connected to him all this time because he couldn't do anything or go anywhere. Nurse, Probation officer reinforce to patient that staff has been in his room every hour, he was asleep when staff was entering his room that's why he wasn't aware that staff was in his room. Patient was argumentative reiterated that no staff had been in room in 15hrs. Nurse writer reoriented patient to both time that night medications was given and that has been within 6 hrs. Patient then begin to talk about his attending was upset that she discontinued his dilaudid, he hope he didn't have her a a doctor again, and no doctor should take away a patient medication when their in pain like he was. Patient asked nurse to discontinue all lines attached to him, nurse educated patient he still had a couple hours left of peritoneal dialysis, and patient insisted that it still be removed. Nurse educated patient that she would have to notify charge nurse and they would get someone to disconnected the dialysis machine. Patient told nurse to just disconnect and was trying to show nurse how to disconnect it, Economist educated patient  that she would collaborate with charge nurse because she couldn't disconnect him from peritoneal dialysis, someone with more experiences would be the one to do this task. Patient was upset as nurse was leaving the room.  Nurse notified charge nurse that patient wanted to be disconnected from dialysis machine.

## 2020-06-01 NOTE — Progress Notes (Signed)
Pt consistently c/o of dissatisfaction over pain management throughout shift requesting "dilaudid" by name. Pt c/o "yall not doing nothing, I'm laying here hurting and I've been here for days and this is the first time yall gave me dialysis."  This RN explained to pt PD had been held previously d/t hypotension. This RN explained that the attending had d/c'd dilaudid and increased his frequency of other pain medication. Explained to pt that dilaudid was "hard" for kidneys to process and not recommended with his medical history. Informed pt that on-call NP had placed an order for benadrly and morphine. Pt initially refused this medication but then agreed to let assigned nurse provide medication per orders. Following assigned Nurses hourly  rounding prior to her lunch break pt  unhooked himself from PD, and tele monitoring. Left unit AMA. AC and security notified. Escorted pt back to unit. Pt decided to go AMA. NP Sharion Settler notified. This nurse removed IV's  and provided paperwork to be signed. Pt requested to be shown direction to leave. I escorted pt to medical mall entrance/ exit where he sat to wait for ride.

## 2020-06-01 NOTE — Discharge Summary (Signed)
Physician [facility] Discharge Summary   John Fortunato.  male DOB: [birth year]  EXH:[id number]  PCP: Servando Snare, MD  Admit date: 05/29/2020 Discharge date: left AMA morning of 06/01/2020 before day shift started, after pt's repeated requests for IV dilaudid were denied.    Admitted From: home CODE STATUS: Full code    Hospital Course:  For full details, please see H&P, progress notes, consult notes and ancillary notes.  Briefly,  John Clineis a 34 y.o.Caucasian malewith medical historysignificant forhistory of failed kidney transplant, end-stage renal disease on peritoneal dialysis, chronic pancreatitis, atrial fibrillation on Eliquis, history of CVA, history of seizures and history of substance abuse who presented to ED via EMS after his friend found him lethargic on the floor at home.    Patient reported having diarrhea, cough, nausea and poor p.o. intake for 3 days.  Found to be hypotensive with blood pressure of 50/30.  Improved with 2 L of bolus.  Patient is fully vaccinated, received a dose of booster, found to be positive for COVID-19.  GI pathogen negative.  CT abdomen with some mild bilateral patchy airspace opacities at both lung bases, small abdominopelvic ascites, and a mild edema of splenic flexure and small bowel.  Lipase was markedly elevated at 1272.  Mildly elevated inflammatory markers.  Started on remdesivir, admitted for COVID-19 gastroenteritis and acute on chronic pancreatitis.  Acute on chronic pancreatitis Lipase elevated at 1272.  CT abdomen without any significant inflammation of pancreas.  Peritoneal fluid cell counts were not supportive of SBP.  Patient received a dose of cefepime and Flagyl in ED and then switched to Zosyn on admission.  Abx d/c'ed the next day due to no signs of infection.  Pt received 3L bolus on presentation.  MIVF@50  ml/hr (gentle hydration due to ESRD) ordered after discussing with nephrology.  Pt complained of  persistent abdominal pain and was ordered IV dilaudid and oxycodone PRN on admission even though pt was found to be sleeping prior to reporting 10/10 pain.  Since pt tolerated full liquid diet and oxycodone, IV dilaudid was d/c'ed on 9/17, and oxycodone frequency increased to q4h PRN, which brought on cussing from pt towards this attending provider.  Pt continued to demand IV dilaudid throughout night of 9/17 to 9/18, and was not satisfied wit the IV morphine ordered by the oncall night provider (see nursing documentation), and decided to leave AMA morning of 9/18 prior to start of day shift.  COVID-19 infection.   Patient is fully vaccinated, even received booster.  Not much respiratory symptoms, mild cough.  Predominant GI symptoms.  Mildly elevated D-dimer with markedly elevated CRP at 15.5.  Chest x-ray with bilateral infiltrates.  Currently not needing any oxygen.  Pt was started on remdesivir and solumedrol 40 mg daily, but didn't finish treatment due to leaving AMA.  Chronic Hyponatremia.   Seems chronic and sodium staying between 125-130 most of the time.    ESRD on peritoneal dialysis. Nephrology was consulted.  Peritoneal fluid send for analysis, and showed no SBP.  Pt didn't receive PD initially due to low BP.  PD was resumed night of 9/17, however, pt didn't finish the session due to being irate and asked to be taken off the machine early.  Hyperphos Medication non-compliance due to ESRD.  Pt has intermittently refused his phos-binder Renvela.  History of failed renal transplant.  Patient was on prednisone 10 mg daily at home which was held while pt was receiving Solu-Medrol inpatient.  Persistent A. Fib/flutter.   Currently rate controlled.  His home Coreg (37.5 mg BID?) and cardizem were held secondary to softer blood pressure.  Home Eliquis continued.  History of seizure disorder.   No acute concern.  continued home vimpat, lamictal and keppra.  History of depression.     No acute concern.  continued home Cymbalta and Remeron.    Discharge Diagnoses:  Principal Problem:   Gastroenteritis due to COVID-19 virus Active Problems:   Hyponatremia   Anemia in chronic renal disease   Chronic abdominal pain   Depressive disorder   Epilepsy ([place])   ESRD (end stage renal disease) ([place])   Kidney transplant failure   Atrial fibrillation (H[facility])   Acute on chronic pancreatitis ([place])   Peritoneal dialysis status ([place])   COVID-19 virus infection   Hypotension due to hypovolemia    Discharge Instructions:  Allergies as of 06/01/2020      Reactions   Dapsone Other (See Comments)   Methemoglobinemia Other reaction(s): Other (See Comments) Childhood allergies methemoglobinemia Other reaction(s): Unknown   Morphine Hives, Rash   Per patient, this is just redness and itching at injection site. Confusion Other reaction(s): Unknown   Lipase Other (See Comments)   Constipation Other reaction(s): Other (See Comments) Constipation Other reaction(s): Unknown   Morphine And Related Rash      Medication List    STOP taking these medications   acetaminophen 500 MG tablet Commonly known as: TYLENOL   amLODipine 10 MG tablet Commonly known as: NORVASC   carvedilol 25 MG tablet Commonly known as: COREG   cinacalcet 30 MG tablet Commonly known as: SENSIPAR   diltiazem 120 MG 24 hr capsule Commonly known as: CARDIZEM CD   DULoxetine 30 MG capsule Commonly known as: CYMBALTA   Eliquis 2.5 MG Tabs tablet Generic drug: apixaban   gabapentin 300 MG capsule Commonly known as: NEURONTIN   Lacosamide 100 MG Tabs   lamoTRIgine 100 MG tablet Commonly known as: LAMICTAL   levETIRAcetam 1000 MG tablet Commonly known as: KEPPRA   mirtazapine 7.5 MG tablet Commonly known as: REMERON   Pancrelipase (Lip-Prot-Amyl) 24000-76000 units Cpep   pantoprazole 40 MG tablet Commonly known as: PROTONIX   predniSONE 10 MG tablet Commonly known as: DELTASONE    ProAir HFA 108 (90 Base) MCG/ACT inhaler Generic drug: albuterol   sevelamer carbonate 800 MG tablet Commonly known as: RENVELA   tamsulosin 0.4 MG Caps capsule Commonly known as: FLOMAX   torsemide 20 MG tablet Commonly known as: DEMADEX   vitamin B-12 1000 MCG tablet Commonly known as: CYANOCOBALAMIN   Vitamin D3 1.25 MG (50000 UT) Caps         Allergies  Allergen Reactions  . Dapsone Other (See Comments)    Methemoglobinemia    Other reaction(s): Other (See Comments) Childhood allergies methemoglobinemia Other reaction(s): Unknown   . Morphine Hives and Rash    Per patient, this is just redness and itching at injection site. Confusion Other reaction(s): Unknown   . Lipase Other (See Comments)    Constipation  Other reaction(s): Other (See Comments) Constipation Other reaction(s): Unknown  . Morphine And Related Rash     The results of significant diagnostics from this hospitalization (including imaging, microbiology, ancillary and laboratory) are listed below for reference.   Consultations:   Procedures/Studies: CT ABDOMEN PELVIS W CONTRAST  Result Date: 05/29/2020 CLINICAL DATA:  Abdominal pain and fever EXAM: CT ABDOMEN AND PELVIS WITH CONTRAST TECHNIQUE: Multidetector CT imaging of the abdomen and pelvis  was performed using the standard protocol following bolus administration of intravenous contrast. CONTRAST:  175mL OMNIPAQUE IOHEXOL 300 MG/ML  SOLN COMPARISON:  December 30, 2019 FINDINGS: Lower chest: The visualized heart size within normal limits. No pericardial fluid/thickening. No hiatal hernia. Subpleural patchy airspace opacity seen at lung bases. Hepatobiliary: The liver is normal in density without focal abnormality.The main portal vein is patent. No evidence of calcified gallstones, gallbladder wall thickening or biliary dilatation. Pancreas: Unremarkable. No pancreatic ductal dilatation or surrounding inflammatory changes. Spleen: Normal in size  without focal abnormality. Adrenals/Urinary Tract: Both adrenal glands appear normal. The patient is status post bilateral nephrectomy. A left lower quadrant renal transplant is present. Mild to moderate pelvicaliectasis ureterectasis noted with mild urothelial hyperenhancement. The bladder is partially decompressed with mild diffuse bladder wall thickening. Stomach/Bowel: The stomach is normal in appearance. There is question of mesenteric edema and clumping of small bowel in the splenic flexure within the left upper quadrant with bowel wall edema. Vascular/Lymphatic: There are no enlarged mesenteric, retroperitoneal, or pelvic lymph nodes. Scattered atherosclerosis at the origin the renal arteries right iliac vasculature. Reproductive: The prostate is unremarkable. Other: A peritoneal dialysis catheter coiled within the deep pelvis. There is a small amount of abdominal ascites. Musculoskeletal: No acute or significant osseous findings. IMPRESSION: Mild bilateral patchy airspace opacities at both lung bases which could be due to atelectasis and/or early infectious etiology. Small abdominopelvic ascites. The bladder is partially decompressed with apparent thickening which could be due to cystitis versus physiologic under distension. Mild edematous appearance to the splenic flexure and small bowel loops. Findings may be infectious, inflammatory, or secondary to hypoalbuminemia. Electronically Signed   By: Prudencio Pair M.D.   On: 05/29/2020 15:31   DG Chest Port 1 View  Result Date: 05/29/2020 CLINICAL DATA:  Question sepsis.  Dialysis patient. EXAM: PORTABLE CHEST 1 VIEW COMPARISON:  01/01/2020 FINDINGS: Mild patchy peripheral infiltrates have developed since the prior study. Heart size within normal limits. Vascularity normal. No pleural effusion. IMPRESSION: Mild patchy peripheral infiltrates bilaterally. Probable pneumonia. Correlate with COVID-19 status. Electronically Signed   By: Franchot Gallo M.D.   On:  05/29/2020 13:52      Labs: BNP (last 3 results) No results for input(s): BNP in the last 8760 hours. Basic Metabolic Panel: Recent Labs  Lab 05/29/20 1305 05/30/20 0507 05/30/20 1818 05/31/20 0615  NA 126* 127* 126* 126*  K 3.8 4.4 4.5 4.3  CL 86* 89* 90* 88*  CO2 23 21* 20* 19*  GLUCOSE 99 114* 121* 104*  BUN 91* 94* 101* 107*  CREATININE 13.62* 13.11* 12.61* 12.88*  CALCIUM 8.0* 7.9* 7.1* 7.2*  MG  --  2.0  --  2.0  PHOS  --  10.5*  --  11.9*   Liver Function Tests: Recent Labs  Lab 05/29/20 1305 05/30/20 0507 05/31/20 0615  AST 21 26 29   ALT 15 14 14   ALKPHOS 117 107 90  BILITOT 0.8 0.7 0.6  PROT 6.3* 5.9* 5.7*  ALBUMIN 3.1* 2.9* 3.0*   Recent Labs  Lab 05/29/20 1936 05/31/20 0615  LIPASE 1,272* 1,159*   No results for input(s): AMMONIA in the last 168 hours. CBC: Recent Labs  Lab 05/29/20 1305 05/30/20 0021 05/30/20 0507 05/31/20 0615  WBC 5.4 2.7* 2.3* 6.2  NEUTROABS 4.1  --  1.9 5.4  HGB 10.1* 9.8* 9.7* 9.5*  HCT 29.0* 28.5* 27.0* 25.9*  MCV 92.9 92.2 90.0 89.0  PLT 176 149* 160 163   Cardiac Enzymes: No  results for input(s): CKTOTAL, CKMB, CKMBINDEX, TROPONINI in the last 168 hours. BNP: Invalid input(s): POCBNP CBG: No results for input(s): GLUCAP in the last 168 hours. D-Dimer No results for input(s): DDIMER in the last 72 hours. Hgb A1c No results for input(s): HGBA1C in the last 72 hours. Lipid Profile No results for input(s): CHOL, HDL, LDLCALC, TRIG, CHOLHDL, LDLDIRECT in the last 72 hours. Thyroid function studies No results for input(s): TSH, T4TOTAL, T3FREE, THYROIDAB in the last 72 hours.  Invalid input(s): FREET3 Anemia work up Recent Labs    05/30/20 0507 05/31/20 0615  FERRITIN 732* 922*   Urinalysis    Component Value Date/Time   COLORURINE YELLOW (A) 12/30/2019 0552   APPEARANCEUR CLEAR (A) 12/30/2019 0552   APPEARANCEUR Clear 05/25/2014 1509   LABSPEC 1.016 12/30/2019 0552   LABSPEC 1.005 05/25/2014 1509    PHURINE 6.0 12/30/2019 0552   GLUCOSEU NEGATIVE 12/30/2019 0552   GLUCOSEU Negative 05/25/2014 1509   HGBUR NEGATIVE 12/30/2019 0552   BILIRUBINUR NEGATIVE 12/30/2019 0552   BILIRUBINUR Negative 05/25/2014 [place] NEGATIVE 12/30/2019 0552   PROTEINUR 100 (A) 12/30/2019 0552   NITRITE NEGATIVE 12/30/2019 0552   LEUKOCYTESUR NEGATIVE 12/30/2019 0552   LEUKOCYTESUR Negative 05/25/2014 1509   Sepsis Labs Invalid input(s): PROCALCITONIN,  WBC,  LACTICIDVEN Microbiology Recent Results (from the past 240 hour(s))  Blood Culture (routine x 2)     Status: None (Preliminary result)   Collection Time: 05/29/20  1:05 PM   Specimen: BLOOD  Result Value Ref Range Status   Specimen Description BLOOD LEFT ARM  Final   Special Requests   Final    BOTTLES DRAWN AEROBIC AND ANAEROBIC Blood Culture results may not be optimal due to an excessive volume of blood received in culture bottles   Culture   Final    NO GROWTH 3 DAYS Performed at [facility], [address] [place], [place] [address]    Report Status PENDING  Incomplete  SARS Coronavirus 2 by RT PCR (hospital order, performed in [place] hospital lab) Nasopharyngeal Nasopharyngeal Swab     Status: Abnormal   Collection Time: 05/29/20  1:06 PM   Specimen: Nasopharyngeal Swab  Result Value Ref Range Status   SARS Coronavirus 2 POSITIVE (A) NEGATIVE Final    Comment: RESULT CALLED TO, READ BACK BY AND VERIFIED WITH: MEGAN JONES @1439  05/29/20 MJU (NOTE) SARS-CoV-2 target nucleic acids are DETECTED  SARS-CoV-2 RNA is generally detectable in upper respiratory specimens  during the acute phase of infection.  Positive results are indicative  of the presence of the identified virus, but do not rule out bacterial infection or co-infection with other pathogens not detected by the test.  Clinical correlation with patient history and  other diagnostic information is necessary to determine patient infection status.  The  expected result is negative.  Fact Sheet for Patients:   StrictlyIdeas.no   Fact Sheet for Healthcare Providers:   BankingDealers.co.za    This test is not yet approved or cleared by the [place] FDA and  has been authorized for detection and/or diagnosis of SARS-CoV-2 by FDA under an Emergency Use Authorization (EUA).  This EUA will remain in effect (meaning this test ca n be used) for the duration of  the COVID-19 declaration under Section 564(b)(1) of the Act, 21 U.S.C. section 360-bbb-3(b)(1), unless the authorization is terminated or revoked sooner.  Performed at [facility], [address], [place], [place] [address]   Gastrointestinal Panel by PCR ,  Stool     Status: None   Collection Time: 05/29/20  7:36 PM   Specimen: Stool  Result Value Ref Range Status   Campylobacter species NOT DETECTED NOT DETECTED Final   Plesimonas shigelloides NOT DETECTED NOT DETECTED Final   Salmonella species NOT DETECTED NOT DETECTED Final   Yersinia enterocolitica NOT DETECTED NOT DETECTED Final   Vibrio species NOT DETECTED NOT DETECTED Final   Vibrio cholerae NOT DETECTED NOT DETECTED Final   Enteroaggregative E coli (EAEC) NOT DETECTED NOT DETECTED Final   Enteropathogenic E coli (EPEC) NOT DETECTED NOT DETECTED Final   Enterotoxigenic E coli (ETEC) NOT DETECTED NOT DETECTED Final   Shiga like toxin producing E coli (STEC) NOT DETECTED NOT DETECTED Final   Shigella/Enteroinvasive E coli (EIEC) NOT DETECTED NOT DETECTED Final   Cryptosporidium NOT DETECTED NOT DETECTED Final   Cyclospora cayetanensis NOT DETECTED NOT DETECTED Final   Entamoeba histolytica NOT DETECTED NOT DETECTED Final   Giardia lamblia NOT DETECTED NOT DETECTED Final   Adenovirus F40/41 NOT DETECTED NOT DETECTED Final   Astrovirus NOT DETECTED NOT DETECTED Final   Norovirus GI/GII NOT DETECTED NOT DETECTED Final   Rotavirus A NOT DETECTED NOT DETECTED  Final   Sapovirus (I, II, IV, and V) NOT DETECTED NOT DETECTED Final    Comment: Performed at [facility], [address] [place], [place] [address]  C Difficile Quick Screen w PCR reflex     Status: None   Collection Time: 05/29/20  7:36 PM   Specimen: Stool  Result Value Ref Range Status   C Diff antigen NEGATIVE NEGATIVE Final   C Diff toxin NEGATIVE NEGATIVE Final   C Diff interpretation No C. difficile detected.  Final    Comment: Performed at [facility], [address] [place], [place] [address]  Blood Culture (routine x 2)     Status: None (Preliminary result)   Collection Time: 05/29/20  8:11 PM   Specimen: BLOOD  Result Value Ref Range Status   Specimen Description BLOOD RIGHT FA  Final   Special Requests   Final    BOTTLES DRAWN AEROBIC AND ANAEROBIC Blood Culture results may not be optimal due to an excessive volume of blood received in culture bottles   Culture   Final    NO GROWTH 3 DAYS Performed at [facility], [address] [place], [place] [address]    Report Status PENDING  Incomplete  Body fluid culture     Status: None (Preliminary result)   Collection Time: 05/30/20 12:21 AM   Specimen: Peritoneal Washings; Body Fluid  Result Value Ref Range Status   Specimen Description   Final    PERITONEAL Performed at [facility], [address], [place], [place] [address]    Special Requests   Final    NONE Performed at [facility], [place], [place] [address]    Gram Stain NO WBC SEEN NO ORGANISMS SEEN   Final   Culture   Final    NO GROWTH 2 DAYS Performed at [facility], [place] [address] [place], [place] [address]    Report Status PENDING  Incomplete     No charge note, since pt left AMA before being seen by me.   Enzo Bi, MD  [facility] 06/01/2020, 8:29 AM  If 7PM-7AM, please contact night-coverage

## 2020-06-01 NOTE — Progress Notes (Addendum)
Nurse Probation officer, told patient she would notifiy hospitalitis about his c/o and concerns. Hospitalitis was notified and gave orders for Morphine 2mg  x1 dose and Benadryl 12.5mg  for allergic reaction to morphine. Pt was asleep at time of order and when nurse entered room at 23:00 patient didn't wake up.

## 2020-06-01 NOTE — Progress Notes (Addendum)
Nurse check on patient again a little after 2:00am because Peritoneal Dialysis Machine was alarming, when nurse entered room, and reset machine, patient was still asleep while IV and dialysis machine both were beeping. Nurse Probation officer had charge nurse to assess dialysis machine, both nurses entered patient, patient was awake. Patient reported that no one had been in his room and he was advised staff had been in his room all night while he was asleep. Patient denied sleeping and nurse assess pain, and notified patient he had a one time order for morphine for pain. Patient got upset and stated he was allergic to morphine, nurse writer educated patient, hospitalitis was aware of his allergic reaction which was a rash at IV site and benadryl was ordered as well to counteract any allergic reactions. Patient refused morphine and told me to ask the doctor for some Dilaudid.

## 2020-06-02 LAB — BODY FLUID CULTURE
Culture: NO GROWTH
Gram Stain: NONE SEEN

## 2020-06-03 LAB — CULTURE, BLOOD (ROUTINE X 2)
Culture: NO GROWTH
Culture: NO GROWTH

## 2020-12-21 DIAGNOSIS — R195 Other fecal abnormalities: Secondary | ICD-10-CM | POA: Insufficient documentation

## 2021-01-02 DIAGNOSIS — K642 Third degree hemorrhoids: Secondary | ICD-10-CM | POA: Insufficient documentation

## 2021-03-11 DIAGNOSIS — K5790 Diverticulosis of intestine, part unspecified, without perforation or abscess without bleeding: Secondary | ICD-10-CM | POA: Insufficient documentation

## 2021-03-11 DIAGNOSIS — K2970 Gastritis, unspecified, without bleeding: Secondary | ICD-10-CM | POA: Insufficient documentation

## 2021-05-14 DIAGNOSIS — T8571XA Infection and inflammatory reaction due to peritoneal dialysis catheter, initial encounter: Secondary | ICD-10-CM | POA: Insufficient documentation

## 2021-07-01 DIAGNOSIS — Z8673 Personal history of transient ischemic attack (TIA), and cerebral infarction without residual deficits: Secondary | ICD-10-CM | POA: Insufficient documentation

## 2021-08-13 DIAGNOSIS — B9561 Methicillin susceptible Staphylococcus aureus infection as the cause of diseases classified elsewhere: Secondary | ICD-10-CM | POA: Insufficient documentation

## 2021-08-21 DIAGNOSIS — L299 Pruritus, unspecified: Secondary | ICD-10-CM | POA: Insufficient documentation

## 2021-08-21 DIAGNOSIS — K648 Other hemorrhoids: Secondary | ICD-10-CM | POA: Insufficient documentation

## 2021-11-10 DIAGNOSIS — E271 Primary adrenocortical insufficiency: Secondary | ICD-10-CM | POA: Insufficient documentation

## 2021-12-08 ENCOUNTER — Ambulatory Visit: Payer: Medicare Other

## 2021-12-08 ENCOUNTER — Encounter (INDEPENDENT_AMBULATORY_CARE_PROVIDER_SITE_OTHER): Payer: Self-pay | Admitting: Podiatry

## 2021-12-08 NOTE — Progress Notes (Signed)
NO SHOW. This encounter was created in error - please disregard.

## 2022-01-01 ENCOUNTER — Other Ambulatory Visit
Admission: RE | Admit: 2022-01-01 | Discharge: 2022-01-01 | Disposition: A | Discharge status: Home / Self Care | Payer: Medicare Other | Source: Other Acute Inpatient Hospital | Attending: Nephrology | Admitting: Nephrology

## 2022-01-01 DIAGNOSIS — K650 Generalized (acute) peritonitis: Secondary | ICD-10-CM | POA: Diagnosis present

## 2022-01-01 LAB — VANCOMYCIN, TROUGH: Vancomycin Tr: 24 ug/mL (ref 15–20)

## 2022-09-04 ENCOUNTER — Other Ambulatory Visit
Admission: RE | Admit: 2022-09-04 | Discharge: 2022-09-04 | Disposition: A | Discharge status: Home / Self Care | Payer: Medicare Other | Source: Ambulatory Visit | Attending: Nephrology | Admitting: Nephrology

## 2022-09-04 DIAGNOSIS — K650 Generalized (acute) peritonitis: Secondary | ICD-10-CM | POA: Insufficient documentation

## 2022-09-04 LAB — VANCOMYCIN, TROUGH: Vancomycin Tr: 17 ug/mL (ref 15–20)

## 2022-11-05 ENCOUNTER — Other Ambulatory Visit
Admission: RE | Admit: 2022-11-05 | Discharge: 2022-11-05 | Disposition: A | Discharge status: Home / Self Care | Payer: Medicare Other | Source: Ambulatory Visit | Attending: Nephrology | Admitting: Nephrology

## 2022-11-05 DIAGNOSIS — K650 Generalized (acute) peritonitis: Secondary | ICD-10-CM | POA: Insufficient documentation

## 2022-11-05 LAB — VANCOMYCIN, TROUGH: Vancomycin Tr: 40 ug/mL (ref 15–20)

## 2022-11-09 ENCOUNTER — Other Ambulatory Visit
Admission: RE | Admit: 2022-11-09 | Discharge: 2022-11-09 | Disposition: A | Discharge status: Home / Self Care | Payer: Medicare Other | Source: Ambulatory Visit | Attending: Nephrology | Admitting: Nephrology

## 2022-11-09 DIAGNOSIS — K650 Generalized (acute) peritonitis: Secondary | ICD-10-CM | POA: Insufficient documentation

## 2022-11-09 LAB — VANCOMYCIN, TROUGH: Vancomycin Tr: 21 ug/mL (ref 15–20)

## 2023-01-07 ENCOUNTER — Other Ambulatory Visit
Admission: RE | Admit: 2023-01-07 | Discharge: 2023-01-07 | Disposition: A | Discharge status: Home / Self Care | Payer: Medicare Other | Source: Ambulatory Visit | Attending: Nephrology | Admitting: Nephrology

## 2023-01-07 DIAGNOSIS — K650 Generalized (acute) peritonitis: Secondary | ICD-10-CM | POA: Diagnosis present

## 2023-01-07 LAB — VANCOMYCIN, TROUGH: Vancomycin Tr: 25 ug/mL (ref 15–20)

## 2023-01-13 ENCOUNTER — Other Ambulatory Visit
Admission: RE | Admit: 2023-01-13 | Discharge: 2023-01-13 | Disposition: A | Discharge status: Home / Self Care | Payer: Medicare Other | Source: Ambulatory Visit | Attending: Nephrology | Admitting: Nephrology

## 2023-01-13 DIAGNOSIS — K630 Abscess of intestine: Secondary | ICD-10-CM | POA: Insufficient documentation

## 2023-01-13 LAB — VANCOMYCIN, TROUGH: Vancomycin Tr: 15 ug/mL (ref 15–20)
